# Patient Record
Sex: Female | Born: 1940
Health system: Southern US, Community
[De-identification: ages and names within clinical notes are randomized; demographics above are authoritative.]

## PROBLEM LIST (undated history)

## (undated) DIAGNOSIS — I340 Nonrheumatic mitral (valve) insufficiency: Secondary | ICD-10-CM

## (undated) DIAGNOSIS — I480 Paroxysmal atrial fibrillation: Secondary | ICD-10-CM

## (undated) DIAGNOSIS — M1990 Unspecified osteoarthritis, unspecified site: Secondary | ICD-10-CM

## (undated) DIAGNOSIS — I10 Essential (primary) hypertension: Secondary | ICD-10-CM

## (undated) DIAGNOSIS — G629 Polyneuropathy, unspecified: Secondary | ICD-10-CM

## (undated) DIAGNOSIS — Z9981 Dependence on supplemental oxygen: Secondary | ICD-10-CM

## (undated) DIAGNOSIS — N8110 Cystocele, unspecified: Secondary | ICD-10-CM

## (undated) DIAGNOSIS — I77810 Thoracic aortic ectasia: Secondary | ICD-10-CM

## (undated) DIAGNOSIS — M353 Polymyalgia rheumatica: Secondary | ICD-10-CM

## (undated) DIAGNOSIS — N390 Urinary tract infection, site not specified: Secondary | ICD-10-CM

## (undated) HISTORY — PX: JOINT REPLACEMENT: SHX530

## (undated) HISTORY — PX: CATARACT EXTRACTION W/ INTRAOCULAR LENS  IMPLANT, BILATERAL: SHX1307

## (undated) HISTORY — DX: Morbid (severe) obesity due to excess calories: E66.01

## (undated) HISTORY — DX: Paroxysmal atrial fibrillation: I48.0

## (undated) HISTORY — PX: ABDOMINAL HYSTERECTOMY: SHX81

## (undated) HISTORY — PX: LAPAROSCOPIC CHOLECYSTECTOMY: SUR755

## (undated) HISTORY — DX: Nonrheumatic mitral (valve) insufficiency: I34.0

## (undated) HISTORY — DX: Polymyalgia rheumatica: M35.3

## (undated) HISTORY — DX: Thoracic aortic ectasia: I77.810

---

## 2006-05-16 HISTORY — PX: TOTAL KNEE ARTHROPLASTY: SHX125

## 2006-10-25 ENCOUNTER — Ambulatory Visit: Payer: Self-pay | Admitting: Cardiology

## 2006-10-25 ENCOUNTER — Inpatient Hospital Stay (HOSPITAL_COMMUNITY): Admission: RE | Admit: 2006-10-25 | Discharge: 2006-11-01 | Payer: Self-pay | Admitting: Orthopedic Surgery

## 2006-10-25 ENCOUNTER — Ambulatory Visit: Payer: Self-pay | Admitting: *Deleted

## 2006-10-31 ENCOUNTER — Encounter (INDEPENDENT_AMBULATORY_CARE_PROVIDER_SITE_OTHER): Payer: Self-pay | Admitting: *Deleted

## 2006-11-15 ENCOUNTER — Ambulatory Visit: Payer: Self-pay | Admitting: Cardiovascular Disease

## 2010-09-28 NOTE — H&P (Signed)
NAME:  [name] [name], [name] [name]                  ACCOUNT NO.:  [id number]   MEDICAL RECORD NO.:  [id number]          PATIENT TYPE:  INP   LOCATION:  5001                         FACILITY:  [facility]   PHYSICIAN:  Dyke Brackett, M.D.    DATE OF BIRTH:  05/15/1941   DATE OF ADMISSION:  10/25/2006  DATE OF DISCHARGE:                              HISTORY & PHYSICAL   CHIEF COMPLAINT:  Left knee pain for the last 6 months.   HISTORY OF PRESENT ILLNESS:  This 70 year old white female patient  presented to Dr. Madelon Lips with a 38-month history of gradual-onset, but  progressively worsening left knee pain.  She has a history of a fall on  that left knee 3 years ago, but no other injury or surgery.  The pain in  the knee is a constant ache to a stabbing sensation diffuse about the  joint without radiation.  Pain increases with weightbearing and  decreases with Darvocet.  The knee locks, gives way and swells; it does  not pop, catch or grinds; it does not keep her up at night.  She has  been using a cane for the last month.  She has received cortisone  injections in the past with minimal relief.   ALLERGIES:  1. DEMEROL causes whelps.  2. LATEX BAND-AIDS cause a rash.  3. SULFA causes pruritus.   CURRENT MEDICATIONS:  1. Toprol-XL 50 mg one and a half tablet p.o. q.a.m.  2. Darvocet-N 100 one tablet p.o. q.4 h. p.r.n. for pain.  3. Aspirin 81 mg one tablet p.o. q.a.m., last dose October 19, 2006.  4. Hydrocodone one tablet p.o. q.4 h. p.r.n. for pain.  5. Tranxene 3.75 mg one tablet p.o. q.a.m. p.r.n. anxiety.   PAST MEDICAL HISTORY:  1. Hypertension.  2. Anxiety.   PAST SURGICAL HISTORY:  1. Hysterectomy, February 20, 1991.  2. Laparoscopic cholecystectomy, July 25, 1996.   She denies any complications from the above-mentioned procedures.   SOCIAL HISTORY:  She has a 58 pack-year history of cigarette smoking,  which she still smokes; she currently smokes about 2 packs a day.  She  does not drink any  alcohol nor use any drugs.  She lives with her  husband in a 2-story house.  They have 3 children who are living and 1  who passed away.  She is retired.   MEDICAL DOCTOR:  Dr. Lelon Huh in [place].   FAMILY HISTORY:  Mother died at age of 24 with Alzheimer's.  Father died  at the age of 53 with hypertension and lung cancer.  She has 1 brother  age 16 and 2 sister age 70.  Her grandparents have a history of  congestive heart failure, heart disease, diabetes and strokes.  She had  1 son who died with pancreatic cancer and she has 3 living boys, age 60,  46 and 66, who are all alive and well.   REVIEW OF SYSTEMS:  She does wear glasses.  She had pneumonia at age 31  and bronchitis about 2 years ago, but no recent problems with that.  She  does have a history of palpitations.  She has diarrhea intermittently  since her cholecystectomy.  She did have whooping cough at age 40.  She  has a remote history of bladder infection with dysuria and burning with  urination.  She does have some anxiety at times and some waking at  times.  All other systems were negative and noncontributory.   PHYSICAL EXAM:  GENERAL:  Well-developed, well-nourished white female in  no acute distress, talks easily with examiner.  Mood and affect are  appropriate.  Walks with an antalgic gait and use of a cane.  Accompanied by a family member.  Height 5-feet 4-inches, weight 206  pounds; BMI is 34.  VITAL SIGNS:  Temperature 98.8 degrees Fahrenheit, pulse 84,  respirations 18, BP 148/82.  HEENT:  Normocephalic, atraumatic, without frontal or maxillary sinus  tenderness to palpation.  Conjunctivae pink.  Sclerae anicteric.  PERRLA.  EOMs intact.  No visible external ear deformities.  Hearing  grossly intact.  Tympanic membranes are pearly gray bilaterally with  good light reflex.  Nose and nasal septum midline.  Nasal mucosa pink  and moist without exudates or polyps noted.  Buccal mucosa pink and  moist.   Dentition in good repair.  Pharynx without erythema or exudates.  Tongue and uvula midline.  Tongue without fasciculations and uvula rises  equally with phonation.  NECK:  No visible masses or lesions noted.  Trachea midline.  No  palpable lymphadenopathy nor thyromegaly.  Carotids are +2 bilaterally  without bruits.  Full range of motion and nontender to palpation along  the cervical spine.  CARDIOVASCULAR:  Heart rate and rhythm regular.  S1 and S2 present with  a grade 3/6 systolic murmur heard best at the right 2nd intercostal  space in the right sternal border.  RESPIRATORY:  Respirations even and unlabored.  Breath sounds clear to  auscultation bilaterally without rales or wheezes noted.  ABDOMEN:  Rounded abdominal contour.  Bowel sounds present x4 quadrants.  Soft and nontender to palpation without hepatosplenomegaly or CVA  tenderness.  BACK:  Nontender to palpation along the vertebral column.  VASCULAR:  Femoral pulses +2 bilaterally.  BREASTS/GU/RECTAL/PELVIC:  These exams deferred at this time.  MUSCULOSKELETAL:  No obvious deformities in bilateral upper extremities  with full range of motion of these extremities without pain.  Radial  pulses +2 bilaterally.  She has full range of motion of her hips, ankles  and toes bilaterally.  DP and PT pulses are +2.  No calf pain with  palpation.  Negative Homans' sign.  Leg otherwise neurovascularly  intact.  Right knee has full extension and flexion to 135 degrees with  minimal crepitus.  There is no pain with palpation along the joint line,  no effusion.  Stable to varus and valgus stress.  Negative anterior  drawer.  Left knee is lacking about 5 degrees of full extension and can  only flex to 95 degrees with minimal crepitus.  She does have pain with  palpation about the posterior aspect of the knee and there is a +2  effusion in the knee.  Stable to varus and valgus stress.  Negative  anterior drawer. NEUROLOGIC:  Alert and  oriented x3.  Cranial nerves II-XII are grossly  intact.  Strength is 5/5 in bilateral upper and lower extremities.  Rapid alternating movements intact.  Deep tendon reflexes 2+ in  bilateral upper and lower extremities.  Sensation intact to light touch.  Rapid alternating movements intact.  RADIOLOGIC FINDINGS:  X-rays taken of the left knee in March 2008 show  an end-stage knee with varus collapse.   IMPRESSION:  1. End-stage osteoarthritis of left knee, medial compartment.  2. Hypertension.  3. Obesity.  4. Anxiety.   PLAN:  Ms. Ruffin Frederick will be admitted to [facility] on October 25, 2006,  where she will undergo a left total knee arthroplasty by Dr. Marcie Mowers.  She will undergo all the routine preoperative laboratory tests  and studies prior to this procedure.  If we have any medical issues  while she is hospitalized, we will consult the hospitalists.      Legrand Pitts Duffy, Arnetha Courser, M.D.  Electronically Signed    KED/MEDQ  D:  10/25/2006  T:  10/26/2006  Job:  [id number]

## 2010-09-28 NOTE — Consult Note (Signed)
NAME:  [name] [name], [name] [name]                  ACCOUNT NO.:  [id number]   MEDICAL RECORD NO.:  [id number]          PATIENT TYPE:  INP   LOCATION:  5001                         FACILITY:  [facility]   PHYSICIAN:  Unice Cobble, MD     DATE OF BIRTH:  03-Apr-1941   DATE OF CONSULTATION:  10/30/2006  DATE OF DISCHARGE:                                 CONSULTATION   REFERRING PHYSICIAN:  Dyke Brackett, M.D.   CHIEF COMPLAINT:  Atrial fibrillation with RVR.   HISTORY OF PRESENT ILLNESS:  This is a 70 year old white female with a  history of hypertension and anxiety, who had a total knee arthroplasty 5  days prior, who started having atrial fibrillation with RVR tonight.  She has palpitations, but does not have any chest pain, shortness of  breath, or presyncope.  No history of prior atrial fibrillation.  No  history of coronary artery disease.  She is a active smoker.  She has  mild lower extremity edema.  No orthopnea or PND.   PAST MEDICAL HISTORY:  1. Hypertension.  2. Anxiety.  3. Status post hysterectomy in 1992.  4. Status post laparoscopic cholecystectomy in 1998.  5. Status post total knee arthroplasty, June 2008.   ALLERGIES:  DEMEROL, LATEX and SULFA.   MEDICATIONS:  1. Toprol-XL 75 mg daily.  2. Darvocet as needed for knee pain.  3. Aspirin 81 mg daily.  4. Hydrocodone as needed for knee pain.  5. Tranxene 3.75 mg p.r.n. anxiety.   SOCIAL HISTORY:  She lives in [place] with her husband.  She is  retired.  She has 50 pack-years and smokes 2 packs per day.  No alcohol  or drugs.   FAMILY HISTORY:  Her mother died at 75 with Alzheimer's.  Her father  died at 94 of lung cancer; he had hypertension.  She has a brother who  infraumbilical skin incision 63 and 2 sisters who are both 53.   REVIEW OF SYSTEMS:  Complete review of systems was done and found to be  otherwise negative except as stated in the HPI.   PHYSICAL EXAMINATION:  VITAL SIGNS:  Temperature is 97.8, pulse 165,  respiratory rate 20, blood pressure 130/70 and O2 SAT 90% on room air.  GENERAL:  She is obese, in no acute distress.  HEENT:  Shows PERRLA, EOMI, moist mucous membranes.  NECK:  Supple without lymphadenopathy, thyromegaly, bruits or  jugulovenous distention.  HEART:  Irregularly irregular rhythm.  Tachycardic.  No murmurs, gallops  or rubs.  Pulses 2+ and equal bilaterally without bruits.  LUNGS:  Clear to auscultation bilaterally without wheezes, rhonchi or  rales.  ABDOMEN:  Soft and nontender with normal bowel sounds, without rebound  or guarding.  EXTREMITIES:  No cyanosis or clubbing.  She has trace edema bilaterally  and left knee staples in a wound that appears clean without induration  or erythema.  NEUROLOGIC:  She is alert and oriented x3.   LABORATORY AND ACCESSORY CLINICAL DATA:  EKG shows a rate of 173 with a  rhythm of atrial fibrillation.  She has  some ST depression, less than a  millimeter in several leads.   Her last hemoglobin was 11 on the 14th of June.   ASSESSMENT AND PLAN:  This is a 70 year old white female with  hypertension with new-onset atrial fibrillation with rapid ventricular  response 5 days out from a total knee arthroplasty.  Diltiazem drip will  be administered for rate control and hopefully she will cardiovert with  this.  If we cannot correct her rhythm to normal sinus rhythm by the  morning, then she may need direct-current cardioversion later in the  day.  I will make her NPO and check a TSH, CBC, basic metabolic panel  and magnesium.  She will need an echocardiogram in the morning to  examine for wall motion abnormality and left atrial enlargement.  I will  rule out myocardial infarction with serial cardiac enzymes.  If all of  the above is negative, then pulmonary embolism will need to be  considered as a potential etiology considering her recent surgery.  If  this is an isolated incident, then she will likely not need long-term   anticoagulation.  However, if this continues for more than 24 hours or  recurs, then she will need to be anticoagulated with heparin in the  short term and probably Coumadin for a short period of time.      Unice Cobble, MD  Electronically Signed     ACJ/MEDQ  D:  10/30/2006  T:  10/30/2006  Job:  [id number]
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
## 2010-09-28 NOTE — Op Note (Signed)
NAME:  [name] [name], [name] [name]                  ACCOUNT NO.:  [id number]   MEDICAL RECORD NO.:  [id number]          PATIENT TYPE:  INP   LOCATION:  2899                         FACILITY:  [facility]   PHYSICIAN:  Dyke Brackett, M.D.    DATE OF BIRTH:  [birth year]   DATE OF PROCEDURE:  10/25/2006  DATE OF DISCHARGE:                               OPERATIVE REPORT   PREOPERATIVE DIAGNOSIS:  Osteoarthritis, left knee, with varus  deformity.   POSTOPERATIVE DIAGNOSIS:  Osteoarthritis, left knee, with varus  deformity.   OPERATION:  Left total knee replacement (LCS standard femur 15 mm  bearing, size #3 tibia, standard 3-peg patella).   SURGEON:  Dyke Brackett, M.D.   ASSISTANT:  Legrand Pitts. Duffy, P.A.   TOURNIQUET TIME:  1 hour 5 minutes.   DESCRIPTION OF PROCEDURE:  Sterile prep and drape, exsanguination of the  leg, placed in a tourniquet 375 mmHg.  Straight skin incision, medial  parapatellar approach to the knee made.  The tibia was cut 2 mm below  the most diseased medial compartment via my anterior-posterior cut.  Flexion gap measured at 12.5 and eventually 15 mm, each along the  extension gap of the distal 4 degree valgus cut.  Moderate release of  the medial structures was required due to the varus deformity.   Attention was next directed at the tibia once the chamfer cuts were made  on the femur, followed by placement of the tibial trial.  The tibial  trial was initially with a 12.5 mm, then a 15-mm bearing.  The 3-peg  patella was cut leaving about 13 mm of native patella.  Trial components  tracked nicely.  No tendency to bearing spinout, although the 15 mm was  somewhat more stable than the 12.5, and was elected to use the 15-mm  bearing.  Trial components were removed.  The bony surfaces irrigated.  Two batches of cement were prepared on the back table, each with 1.2  grams of tobramycin. This was inserted on the tibia, followed by the  femur and patella.  Excess cement was removed.  The  trial bearing was  placed. Excess cement was removed from the posterior aspect of the knee.  The tourniquet was released.  Small bleeders were coagulated.  No  excessive bleeding was noted.  The 15 mm trial was better than the 12.5.  Final bearing was inserted.  Closure was effected on the capsule with  Ethibond and 2-0 Vicryl and skin clips on the skin.  Taken to the  recovery room in stable condition.      Dyke Brackett, M.D.  Electronically Signed     WDC/MEDQ  D:  10/25/2006  T:  10/25/2006  Job:  [id number]

## 2010-10-01 NOTE — Discharge Summary (Signed)
NAME:  [name] [name]                  ACCOUNT NO.:  [id number]   MEDICAL RECORD NO.:  [id number]          PATIENT TYPE:  INP   LOCATION:  2003                         FACILITY:  [facility]   PHYSICIAN:  Dyke [name], M.D.    DATE OF BIRTH:  [birth year]   DATE OF ADMISSION:  10/25/2006  DATE OF DISCHARGE:  11/01/2006                               DISCHARGE SUMMARY   ADMISSION DIAGNOSES:  1. End-stage osteoarthritis, left knee.  2. Hypertension.  3. Obesity.  4. Anxiety.   DISCHARGE DIAGNOSES:  1. End-stage osteoarthritis, left knee, status post left total knee      arthroplasty.  2. Acute blood loss anemia secondary to surgery.  3. Hypokalemia.  4. Hyponatremia.  5. New onset atrial fibrillation now status post cardioversion.  6. Urinary tract infection.  7. Hypertension.  8. Obesity.  9. Anxiety.   SURGICAL PROCEDURES:  On October 25, 2006, [name] [name] underwent a left total  knee arthroplasty by [name] [name] assisted by on a Arnoldo Morale, PA-C.  She had a DePuy NBT keel tibial tray cemented size three  placed with a metal backed patella cemented size standard, an LCS  complete primary femoral component cemented size standard left and LCS  complete RP insert size standard 15 mm thickness.   COMPLICATIONS:  None.   CONSULTANT:  1. Case management and physical therapy consult October 26, 2006.  2. Occupational therapy consult October 27, 2006.  3. Rapid response and [facility] consult October 30, 2006.   HISTORY OF PRESENT ILLNESS:  This 70 year old white female patient  presented to [name] with the 24-month history of gradual onset,  progressive left knee pain.  She has a remote history of fall on the  left knee but no surgery.  Pain is constant, ache to stabbing, diffuse  about the joint without radiation.  It increases with weightbearing and  decreases with Darvocet.  Knee locks, gives way, and swells.  She has  walked with cane for the last month, and she has failed  conservative  treatment.  Because of that, she is presenting for a left total knee  replacement.   HOSPITAL COURSE:  [name] [name] tolerated her surgical procedure well without  immediate postoperative complications.  She was transferred to 5000.  Postop day #1, she was dizzy with standing, had poor appetite.  Hemoglobin was 8.7, hematocrit 25.8.  She was transfused with 2 units of  packed red blood cells and started on therapy per protocol.   Postop day #2, pain was fairly well controlled.  She was feeling better  after the blood.  She had low urine output, and that was watched.  Hemoglobin was improved to 10.4, hematocrit 31.1, and white count was  12.3.  Leg was neurovascularly intact.  She was switched to p.o. pain  medications.  Urine output was monitored, and a new UA and chest x-ray  were ordered.   Postop day #3, T-max 99.2, vital signs stable.  UA did show possible  UTI, so she was started on Cipro 500 twice a day for  3 days.  Hemoglobin  and hematocrit were stable.  Concerns were made for discharge home, so  plans were made for transfer to a skilled facility after the weekend.  She continued to do well during June 15 with no major problems, and then  in the middle of the night on June 16, she felt like her heart was  racing.  Pulse was 165, and she was in new onset rapid atrial  fibrillation.  [facility] was called.  They followed her the  rest of the hospitalization.  On the morning of June 16 due to still  being in rapid atrial fibrillation, a cardioversion was done, and she  tolerated that well.  Hemoglobin was 12.3, hematocrit 38.3, white count  10.6.  Leg was otherwise neurovascularly intact.  She was treated  initially with IV heparin through June 17 and was switched back to  Lovenox.  She was also treated with IV Cardizem until June 18. She  continued to do well with therapy and made good progress.  On the  morning of June 18, she was ready for discharge home.   T-max was 98.  Vital signs were stable.  She was maintained in normal sinus rhythm.  UTI had been treated with Cipro.  She was weaned off the Cardizem, and  it was felt she was ready for discharge home and was discharged home  later that day.   DISCHARGE INSTRUCTIONS:   DIET:  She can resume her regular prehospitalization diet.   MEDICATIONS:  She may resume her home medications except no Darvocet,  aspirin,Vicodin, or Tranxene at this time.  Home medications include:  1. Toprol XL 50 mg one p.o. q.a.m.  2. She was on Tranxene just p.r.n..  Additional medications at this time were:  1. Lovenox 40 mg subcutaneously q. 8 a.m. with the last dose to be on      June 25.  She was given 7 with no refill.  2. On June 26, she can restart  aspirin.  3. Percocet 5/325 one to two p.o. q.4 h p.r.n. for pain, #60 with no      refill.  4. Robaxin 500 mg 1-2 tablets p.o. q.6 h p.r.n. for spasm. #45 with no      refill.  5. Toprol XL 100 mg p.o. q.a.m.Marland Kitchen   ACTIVITY:  She can be out of bed, weightbearing as tolerated on the left  leg with the use of walker.  She is to have home health PT per [place].  Please see the blue total knee discharge sheet for further activity  instructions.   WOUND CARE:  She may shower after no drainage from the wound for 2 days.  Please see the blue total knee discharge sheet for further wound care  instructions.   FOLLOWUP:  She needs to follow up with [name] [name] in our [facility]  office or the [place] office on or about June 24, and she can call  [phone] to make that appointment in [place].  She is to follow up  with [name] [name] in his office on July 2 and 3:00 p.m. and needs to call  [phone] with any questions.   LABORATORY DATA:  Chest x-ray done on June 13 showed no pneumonia but  mild pulmonary vascular congestion.   Hemoglobin and hematocrit ranged from 11.2 and 33, respectively, on June  5 to 8.7 and 25.8, respectively, on the June 12 to 11.6 and  35.1,  respectively,  on June 17.  White  count went from 8.8 on June 5 to 11.1  on June 17.  Platelets went from 553 on June 5 to 339 on June 13 to 527  on June 17.  PTT on June 5 was 48.   Sodium ranged from 134 on June 5 to the 131 on June 12 to 135 on June  16.  Potassium dropped to a low of 3.3 on June 13 and then improved to  4.1.  Chloride dropped to a low of 94 on June 16.  Glucose ranged from  90 on June 5 to a high of 141 on June 16.  BUN and creatinine were 6 and  0.7, respectively,  on June 5 to 3 and 0.68, respectively, on June 13 to  4 and 0.72, respectively, on June 16.   Albumin dropped to a low of three on June 5.  Urinalysis on June 13  showed small leukocyte esterase, many epithelial, 3-6 white cells, no  red cells and few bacteria.  Culture showed no growth.      Legrand Pitts Duffy, Arnetha Courser, M.D.  Electronically Signed    KED/MEDQ  D:  12/12/2006  T:  12/12/2006  Job:  [id number]   cc:   Noralyn Pick. Eden Emms, MD, [facility]

## 2011-03-02 LAB — CBC
HCT: 35.1 — ABNORMAL LOW
HCT: 38.5
Hemoglobin: 11.6 — ABNORMAL LOW
Hemoglobin: 12.5
MCHC: 32.4
MCHC: 33.1
MCV: 90.4
MCV: 90.9
Platelets: 525 — ABNORMAL HIGH
Platelets: 527 — ABNORMAL HIGH
RBC: 3.88
RBC: 4.24
RDW: 14.7 — ABNORMAL HIGH
RDW: 14.9 — ABNORMAL HIGH
WBC: 10.6 — ABNORMAL HIGH
WBC: 11.1 — ABNORMAL HIGH

## 2011-03-02 LAB — HEPARIN LEVEL (UNFRACTIONATED)

## 2011-03-02 LAB — PROTIME-INR: INR: 1

## 2011-03-02 LAB — DIFFERENTIAL
Basophils Absolute: 0.1
Lymphocytes Relative: 25
Monocytes Absolute: 1.3 — ABNORMAL HIGH
Neutro Abs: 6.3

## 2011-03-02 LAB — BASIC METABOLIC PANEL
BUN: 4 — ABNORMAL LOW
GFR calc Af Amer: >60
GFR calc non Af Amer: >60
Potassium: 4.1
Sodium: 135

## 2011-03-02 LAB — PHOSPHORUS: Phosphorus: 4.3

## 2011-03-02 LAB — TSH: TSH: 0.895

## 2011-03-03 LAB — BASIC METABOLIC PANEL
BUN: 3 — ABNORMAL LOW
BUN: 3 — ABNORMAL LOW
CO2: 27
Calcium: 8.3 — ABNORMAL LOW
Creatinine, Ser: 0.68
GFR calc non Af Amer: >60
Glucose, Bld: 130 — ABNORMAL HIGH
Glucose, Bld: 135 — ABNORMAL HIGH
Potassium: 3.8
Sodium: 131 — ABNORMAL LOW

## 2011-03-03 LAB — COMPREHENSIVE METABOLIC PANEL
ALT: 17
Alkaline Phosphatase: 91
BUN: 6
CO2: 27
Chloride: 97
Glucose, Bld: 90
Potassium: 3.6
Sodium: 134 — ABNORMAL LOW
Total Bilirubin: 0.4
Total Protein: 7.4

## 2011-03-03 LAB — CBC
HCT: 25.8 — ABNORMAL LOW
HCT: 31.1 — ABNORMAL LOW
HCT: 33.1 — ABNORMAL LOW
Hemoglobin: 8.7 — ABNORMAL LOW
MCHC: 33.4
MCHC: 33.9
MCV: 90.4
MCV: 90.6
Platelets: 339
Platelets: 401 — ABNORMAL HIGH
RDW: 14.2 — ABNORMAL HIGH
RDW: 14.4 — ABNORMAL HIGH
RDW: 14.8 — ABNORMAL HIGH
RDW: 14.9 — ABNORMAL HIGH

## 2011-03-03 LAB — URINALYSIS, ROUTINE W REFLEX MICROSCOPIC
Bilirubin Urine: NEGATIVE
Hgb urine dipstick: NEGATIVE
Ketones, ur: NEGATIVE
Protein, ur: NEGATIVE
Specific Gravity, Urine: 1.015 (ref 1.005–1.035)
Urobilinogen, UA: 0.2
Urobilinogen, UA: 0.2
pH: 5.5

## 2011-03-03 LAB — URINE CULTURE
Colony Count: NO GROWTH
Culture: NO GROWTH

## 2011-03-03 LAB — DIFFERENTIAL
Basophils Absolute: 0.1
Basophils Relative: 1
Eosinophils Absolute: 0.1
Monocytes Relative: 10
Neutro Abs: 4.3
Neutrophils Relative %: 49

## 2011-03-03 LAB — CROSSMATCH: ABO/RH(D): A POS

## 2011-03-03 LAB — PROTIME-INR: INR: 1.1

## 2011-03-03 LAB — URINE MICROSCOPIC-ADD ON

## 2015-07-18 DIAGNOSIS — Z79899 Other long term (current) drug therapy: Secondary | ICD-10-CM | POA: Insufficient documentation

## 2015-07-18 DIAGNOSIS — M1990 Unspecified osteoarthritis, unspecified site: Secondary | ICD-10-CM | POA: Insufficient documentation

## 2015-11-09 DIAGNOSIS — M25552 Pain in left hip: Secondary | ICD-10-CM | POA: Insufficient documentation

## 2016-05-06 DIAGNOSIS — M8580 Other specified disorders of bone density and structure, unspecified site: Secondary | ICD-10-CM | POA: Insufficient documentation

## 2016-09-07 DIAGNOSIS — M810 Age-related osteoporosis without current pathological fracture: Secondary | ICD-10-CM | POA: Insufficient documentation

## 2017-01-11 DIAGNOSIS — R609 Edema, unspecified: Secondary | ICD-10-CM | POA: Insufficient documentation

## 2017-03-31 DIAGNOSIS — R7301 Impaired fasting glucose: Secondary | ICD-10-CM | POA: Insufficient documentation

## 2017-10-07 ENCOUNTER — Inpatient Hospital Stay (HOSPITAL_BASED_OUTPATIENT_CLINIC_OR_DEPARTMENT_OTHER)
Admission: EM | Admit: 2017-10-07 | Discharge: 2017-10-12 | DRG: 308 | Disposition: A | Discharge status: Home / Self Care | Payer: Medicare Other | Attending: Family Medicine | Admitting: Family Medicine

## 2017-10-07 ENCOUNTER — Emergency Department (HOSPITAL_BASED_OUTPATIENT_CLINIC_OR_DEPARTMENT_OTHER): Payer: Medicare Other

## 2017-10-07 ENCOUNTER — Encounter (HOSPITAL_BASED_OUTPATIENT_CLINIC_OR_DEPARTMENT_OTHER): Payer: Self-pay | Admitting: Emergency Medicine

## 2017-10-07 ENCOUNTER — Other Ambulatory Visit: Payer: Self-pay

## 2017-10-07 DIAGNOSIS — Z90710 Acquired absence of both cervix and uterus: Secondary | ICD-10-CM | POA: Diagnosis not present

## 2017-10-07 DIAGNOSIS — R000 Tachycardia, unspecified: Secondary | ICD-10-CM | POA: Diagnosis present

## 2017-10-07 DIAGNOSIS — R0609 Other forms of dyspnea: Secondary | ICD-10-CM

## 2017-10-07 DIAGNOSIS — Z79899 Other long term (current) drug therapy: Secondary | ICD-10-CM

## 2017-10-07 DIAGNOSIS — Z91048 Other nonmedicinal substance allergy status: Secondary | ICD-10-CM | POA: Diagnosis not present

## 2017-10-07 DIAGNOSIS — D638 Anemia in other chronic diseases classified elsewhere: Secondary | ICD-10-CM | POA: Diagnosis present

## 2017-10-07 DIAGNOSIS — R062 Wheezing: Secondary | ICD-10-CM | POA: Diagnosis not present

## 2017-10-07 DIAGNOSIS — I509 Heart failure, unspecified: Secondary | ICD-10-CM | POA: Diagnosis not present

## 2017-10-07 DIAGNOSIS — Z882 Allergy status to sulfonamides status: Secondary | ICD-10-CM

## 2017-10-07 DIAGNOSIS — G629 Polyneuropathy, unspecified: Secondary | ICD-10-CM

## 2017-10-07 DIAGNOSIS — M1990 Unspecified osteoarthritis, unspecified site: Secondary | ICD-10-CM | POA: Diagnosis present

## 2017-10-07 DIAGNOSIS — Z87891 Personal history of nicotine dependence: Secondary | ICD-10-CM

## 2017-10-07 DIAGNOSIS — Z6841 Body Mass Index (BMI) 40.0 and over, adult: Secondary | ICD-10-CM

## 2017-10-07 DIAGNOSIS — Z888 Allergy status to other drugs, medicaments and biological substances status: Secondary | ICD-10-CM

## 2017-10-07 DIAGNOSIS — I10 Essential (primary) hypertension: Secondary | ICD-10-CM

## 2017-10-07 DIAGNOSIS — E876 Hypokalemia: Secondary | ICD-10-CM | POA: Diagnosis present

## 2017-10-07 DIAGNOSIS — I420 Dilated cardiomyopathy: Secondary | ICD-10-CM

## 2017-10-07 DIAGNOSIS — Z7952 Long term (current) use of systemic steroids: Secondary | ICD-10-CM | POA: Diagnosis not present

## 2017-10-07 DIAGNOSIS — I4891 Unspecified atrial fibrillation: Principal | ICD-10-CM

## 2017-10-07 DIAGNOSIS — Z8249 Family history of ischemic heart disease and other diseases of the circulatory system: Secondary | ICD-10-CM | POA: Diagnosis not present

## 2017-10-07 DIAGNOSIS — I5021 Acute systolic (congestive) heart failure: Secondary | ICD-10-CM | POA: Diagnosis present

## 2017-10-07 DIAGNOSIS — Z9049 Acquired absence of other specified parts of digestive tract: Secondary | ICD-10-CM

## 2017-10-07 DIAGNOSIS — M353 Polymyalgia rheumatica: Secondary | ICD-10-CM | POA: Diagnosis present

## 2017-10-07 DIAGNOSIS — I110 Hypertensive heart disease with heart failure: Secondary | ICD-10-CM | POA: Diagnosis present

## 2017-10-07 DIAGNOSIS — I480 Paroxysmal atrial fibrillation: Secondary | ICD-10-CM | POA: Diagnosis present

## 2017-10-07 HISTORY — DX: Essential (primary) hypertension: I10

## 2017-10-07 HISTORY — DX: Unspecified osteoarthritis, unspecified site: M19.90

## 2017-10-07 LAB — COMPREHENSIVE METABOLIC PANEL
ALK PHOS: 63 U/L (ref 38–126)
ALT: 62 U/L — AB (ref 14–54)
AST: 62 U/L — AB (ref 15–41)
Albumin: 4.1 g/dL (ref 3.5–5.0)
Anion gap: 12 (ref 5–15)
BUN: 14 mg/dL (ref 6–20)
CALCIUM: 8.6 mg/dL — AB (ref 8.9–10.3)
CO2: 24 mmol/L (ref 22–32)
CREATININE: 1 mg/dL (ref 0.44–1.00)
Chloride: 103 mmol/L (ref 101–111)
GFR, EST NON AFRICAN AMERICAN: 53 mL/min — AB (ref >60)
GLUCOSE: 159 mg/dL — AB (ref 65–99)
Potassium: 3.5 mmol/L (ref 3.5–5.1)
Sodium: 139 mmol/L (ref 135–145)
Total Bilirubin: 0.6 mg/dL (ref 0.3–1.2)
Total Protein: 7.3 g/dL (ref 6.5–8.1)

## 2017-10-07 LAB — CBC WITH DIFFERENTIAL/PLATELET
BASOS ABS: 0.1 10*3/uL (ref 0.0–0.1)
Basophils Relative: 0 %
Eosinophils Absolute: 0.1 10*3/uL (ref 0.0–0.7)
Eosinophils Relative: 1 %
HEMATOCRIT: 40.8 % (ref 36.0–46.0)
Hemoglobin: 13.8 g/dL (ref 12.0–15.0)
LYMPHS ABS: 2.5 10*3/uL (ref 0.7–4.0)
LYMPHS PCT: 22 %
MCH: 33.4 pg (ref 26.0–34.0)
MCHC: 33.8 g/dL (ref 30.0–36.0)
MCV: 98.8 fL (ref 78.0–100.0)
MONO ABS: 0.9 10*3/uL (ref 0.1–1.0)
MONOS PCT: 8 %
NEUTROS ABS: 7.7 10*3/uL (ref 1.7–7.7)
Neutrophils Relative %: 69 %
Platelets: 230 10*3/uL (ref 150–400)
RBC: 4.13 MIL/uL (ref 3.87–5.11)
RDW: 13.8 % (ref 11.5–15.5)
WBC: 11.2 10*3/uL — ABNORMAL HIGH (ref 4.0–10.5)

## 2017-10-07 LAB — HEPARIN LEVEL (UNFRACTIONATED): Heparin Unfractionated: 0.12 IU/mL — ABNORMAL LOW (ref 0.30–0.70)

## 2017-10-07 LAB — TROPONIN I
Troponin I: 0.04 ng/mL (ref <0.03)
Troponin I: 0.04 ng/mL

## 2017-10-07 LAB — LIPASE, BLOOD: LIPASE: 30 U/L (ref 11–51)

## 2017-10-07 LAB — BRAIN NATRIURETIC PEPTIDE: B Natriuretic Peptide: 920.6 pg/mL — ABNORMAL HIGH (ref 0.0–100.0)

## 2017-10-07 LAB — TSH: TSH: 2.315 u[IU]/mL (ref 0.350–4.500)

## 2017-10-07 MED ORDER — ACETAMINOPHEN 325 MG PO TABS
650.0000 mg | ORAL_TABLET | Freq: Four times a day (QID) | ORAL | Status: DC | PRN
  Administered 2017-10-08 – 2017-10-10 (×2): 650 mg via ORAL
  Filled 2017-10-07 (×2): qty 2

## 2017-10-07 MED ORDER — METOPROLOL TARTRATE 25 MG PO TABS
25.0000 mg | ORAL_TABLET | Freq: Four times a day (QID) | ORAL | Status: DC
  Administered 2017-10-07 – 2017-10-10 (×12): 25 mg via ORAL
  Filled 2017-10-07 (×12): qty 1

## 2017-10-07 MED ORDER — HEPARIN (PORCINE) IN NACL 100-0.45 UNIT/ML-% IJ SOLN
1800.0000 [IU]/h | INTRAMUSCULAR | Status: DC
Start: 2017-10-07 — End: 2017-10-09
  Administered 2017-10-07: 1150 [IU]/h via INTRAVENOUS
  Administered 2017-10-08: 1450 [IU]/h via INTRAVENOUS
  Administered 2017-10-09: 1800 [IU]/h via INTRAVENOUS
  Filled 2017-10-07 (×3): qty 250

## 2017-10-07 MED ORDER — GABAPENTIN 300 MG PO CAPS
300.0000 mg | ORAL_CAPSULE | Freq: Every day | ORAL | Status: DC
  Administered 2017-10-07 – 2017-10-11 (×5): 300 mg via ORAL
  Filled 2017-10-07 (×5): qty 1

## 2017-10-07 MED ORDER — HYDROCODONE-ACETAMINOPHEN 5-325 MG PO TABS
1.0000 | ORAL_TABLET | Freq: Four times a day (QID) | ORAL | Status: DC | PRN
  Administered 2017-10-07 – 2017-10-09 (×5): 2 via ORAL
  Administered 2017-10-09: 1 via ORAL
  Administered 2017-10-09: 2 via ORAL
  Administered 2017-10-10: 1 via ORAL
  Administered 2017-10-10 – 2017-10-12 (×7): 2 via ORAL
  Filled 2017-10-07: qty 1
  Filled 2017-10-07 (×14): qty 2

## 2017-10-07 MED ORDER — SODIUM CHLORIDE 0.9 % IV SOLN: 250.0000 mL | INTRAVENOUS | Status: DC | PRN

## 2017-10-07 MED ORDER — PREDNISONE 10 MG PO TABS
10.0000 mg | ORAL_TABLET | Freq: Every day | ORAL | Status: DC
  Administered 2017-10-08 – 2017-10-12 (×5): 10 mg via ORAL
  Filled 2017-10-07 (×5): qty 1

## 2017-10-07 MED ORDER — DILTIAZEM LOAD VIA INFUSION
10.0000 mg | Freq: Once | INTRAVENOUS | Status: DC
  Administered 2017-10-07: 10 mg via INTRAVENOUS
  Filled 2017-10-07: qty 10

## 2017-10-07 MED ORDER — SODIUM CHLORIDE 0.9% FLUSH
3.0000 mL | Freq: Two times a day (BID) | INTRAVENOUS | Status: DC
  Administered 2017-10-07 – 2017-10-10 (×5): 3 mL via INTRAVENOUS

## 2017-10-07 MED ORDER — HEPARIN BOLUS VIA INFUSION
4000.0000 [IU] | Freq: Once | INTRAVENOUS | Status: AC
  Administered 2017-10-07: 4000 [IU] via INTRAVENOUS

## 2017-10-07 MED ORDER — DILTIAZEM HCL 100 MG IV SOLR
5.0000 mg/h | INTRAVENOUS | Status: DC
  Administered 2017-10-08: 15 mg/h via INTRAVENOUS
  Filled 2017-10-07 (×8): qty 100

## 2017-10-07 MED ORDER — METOPROLOL TARTRATE 5 MG/5ML IV SOLN
2.5000 mg | Freq: Four times a day (QID) | INTRAVENOUS | Status: DC | PRN
  Administered 2017-10-07: 2.5 mg via INTRAVENOUS

## 2017-10-07 MED ORDER — METOPROLOL TARTRATE 25 MG PO TABS
37.5000 mg | ORAL_TABLET | Freq: Every evening | ORAL | Status: DC
  Administered 2017-10-07: 37.5 mg via ORAL
  Filled 2017-10-07: qty 1

## 2017-10-07 MED ORDER — FUROSEMIDE 10 MG/ML IJ SOLN
40.0000 mg | Freq: Once | INTRAMUSCULAR | Status: AC
  Administered 2017-10-07: 40 mg via INTRAVENOUS

## 2017-10-07 MED ORDER — METOPROLOL TARTRATE 5 MG/5ML IV SOLN
INTRAVENOUS | Status: AC
  Filled 2017-10-07: qty 5

## 2017-10-07 MED ORDER — SODIUM CHLORIDE 0.9% FLUSH: 3.0000 mL | INTRAVENOUS | Status: DC | PRN

## 2017-10-07 MED ORDER — ACETAMINOPHEN 650 MG RE SUPP: 650.0000 mg | Freq: Four times a day (QID) | RECTAL | Status: DC | PRN

## 2017-10-07 MED ORDER — METOPROLOL TARTRATE 25 MG PO TABS: 25.0000 mg | ORAL_TABLET | Freq: Every morning | ORAL | Status: DC

## 2017-10-07 MED ORDER — POTASSIUM CHLORIDE CRYS ER 20 MEQ PO TBCR
40.0000 meq | EXTENDED_RELEASE_TABLET | Freq: Once | ORAL | Status: AC
  Administered 2017-10-07: 40 meq via ORAL
  Filled 2017-10-07: qty 2

## 2017-10-07 MED ORDER — ONDANSETRON HCL 4 MG/2ML IJ SOLN
4.0000 mg | Freq: Four times a day (QID) | INTRAMUSCULAR | Status: DC | PRN
  Administered 2017-10-09: 4 mg via INTRAVENOUS
  Filled 2017-10-07: qty 2

## 2017-10-07 MED ORDER — ONDANSETRON HCL 4 MG PO TABS
4.0000 mg | ORAL_TABLET | Freq: Four times a day (QID) | ORAL | Status: DC | PRN
  Administered 2017-10-08 – 2017-10-09 (×2): 4 mg via ORAL
  Filled 2017-10-07 (×2): qty 1

## 2017-10-07 MED ORDER — FUROSEMIDE 10 MG/ML IJ SOLN
INTRAMUSCULAR | Status: AC
  Filled 2017-10-07: qty 4

## 2017-10-07 MED ORDER — POLYETHYLENE GLYCOL 3350 17 G PO PACK
17.0000 g | PACK | Freq: Every day | ORAL | Status: DC
  Filled 2017-10-07 (×2): qty 1

## 2017-10-07 NOTE — ED Notes (Signed)
Report called to [employer], receiving nurse at [facility]

## 2017-10-07 NOTE — ED Notes (Signed)
Date and time results received: 10/07/17 1207   Test: trp Critical Value: 0.04 Name of Provider Notified: Madilyn Hook Orders Received? Or Actions Taken?: no orders given

## 2017-10-07 NOTE — ED Notes (Signed)
ED Provider at bedside. 

## 2017-10-07 NOTE — ED Notes (Signed)
Patient is resting comfortably. 

## 2017-10-07 NOTE — H&P (Signed)
[facility] History and Physical  [name] [name] YQM:[id number] DOB: 05-02-1941 DOA: 10/07/2017  Referring physician: Dr [name] [name].  PCP: [name] Huh [name], MD   Chief Complaint: Dyspnea.   HPI: [name] [name] is a 77 y.o. female with PMH significant for HTN, Polymyalgia Rheumatica, who presents complaining of SOB, Abdominal floating. She report SOB and abdominal bloating since Tuesday. She also notice 6 pounds weight gain. She denies chest pain. She had ECHO 2 Month ago normal ? .  She denies abdominal pain, denies diarrhea, constipation.   Evaluation in the ED at [facility]; normal electrolytes, she was found to be in A fib RVR. She was started on IV Cardizem Gtt and heparin Gtt. AST at 62, BNP 920, troponin 0.04, WBC 11, chest x ray no active diseases.    Review of Systems:  Negative , except as per HPI.   Past Medical History:  Diagnosis Date  . Arthritis   . Hypertension   . Neuropathy   . Polymyalgia (HCC)    Past Surgical History:  Procedure Laterality Date  . ABDOMINAL HYSTERECTOMY    . CHOLECYSTECTOMY    . KNEE ARTHROSCOPY     Social History:  reports that she has quit smoking. She has never used smokeless tobacco. Her alcohol and drug histories are not on file.  Allergies  Allergen Reactions  . Demerol [Meperidine Hcl] Rash  . Sulfa Antibiotics Rash    Family history;  Father died of lung cancer.  Mother; had alzheimer diseases.   Prior to Admission medications   Not on File   Physical Exam: Vitals:   10/07/17 1545 10/07/17 1555 10/07/17 1600 10/07/17 1615  BP:   127/88   Pulse: (!) 101  (!) 138 92  Resp: 15  (!) 25 17  Temp:  97.8 F (36.6 C)    TempSrc:  Oral    SpO2: 99%  97% 98%  Weight:      Height:        Wt Readings from Last 3 Encounters:  10/07/17 113.4 kg (250 lb)    General:  Appears calm and comfortable, obesed.  Eyes: PERRL, normal lids, irises & conjunctiva ENT: grossly normal hearing, lips & tongue Neck: no LAD, masses or  thyromegaly Cardiovascular: IRR, no m/r/g. Bilateral LE edema.  Respiratory: Bilateral crackles.  Tachypnea.  Abdomen: soft, ntnd, obese Skin: no rash or induration seen on limited exam Musculoskeletal: grossly normal tone BUE/BLE Psychiatric: grossly normal mood and affect, speech fluent and appropriate Neurologic: grossly non-focal.          Labs on Admission:  Basic Metabolic Panel: Recent Labs  Lab 10/07/17 1124  NA 139  K 3.5  CL 103  CO2 24  GLUCOSE 159*  BUN 14  CREATININE 1.00  CALCIUM 8.6*   Liver Function Tests: Recent Labs  Lab 10/07/17 1124  AST 62*  ALT 62*  ALKPHOS 63  BILITOT 0.6  PROT 7.3  ALBUMIN 4.1   Recent Labs  Lab 10/07/17 1124  LIPASE 30   No results for input(s): AMMONIA in the last 168 hours. CBC: Recent Labs  Lab 10/07/17 1124  WBC 11.2*  NEUTROABS 7.7  HGB 13.8  HCT 40.8  MCV 98.8  PLT 230   Cardiac Enzymes: Recent Labs  Lab 10/07/17 1124  TROPONINI 0.04*    BNP (last 3 results) Recent Labs    10/07/17 1124  BNP 920.6*    ProBNP (last 3 results) No results for input(s): PROBNP in the last 8760 hours.  CBG:  No results for input(s): GLUCAP in the last 168 hours.  Radiological Exams on Admission: Dg Chest Port 1 View  Result Date: 10/07/2017 CLINICAL DATA:  77 year-old female c/o feeling bloated, SOB, and weakness and decreased appetite x 3 days. EXAM: PORTABLE CHEST 1 VIEW COMPARISON:  None. FINDINGS: The heart size and mediastinal contours are within normal limits. Both lungs are clear. No pleural effusion or pneumothorax. The visualized skeletal structures are intact. IMPRESSION: No active disease. Electronically Signed   By: Amie Portland M.D.   On: 10/07/2017 11:42    EKG: Independently reviewed. A fib RVR.   Assessment/Plan Active Problems:   Atrial fibrillation with RVR (HCC)   HTN (hypertension)   Neuropathy  1-A fib RVR;  Admit to hospital.  Continue with Cardizem Gtt.  Check TSH, ECHO,  Troponin.  Cardiology will be consulted.  Will resume home dose metoprolol.  Started on Heparin Gtt at [employer] [place]. Will continue heparin.   2-Dyspnea; suspect related to A fib RVR, also component of volume overload. She report 6 pounds weight gain since Tuesday/  She takes oral lasix at home.  I will order one time dose of IV lasix. Will need to order lasix for 5-26. Will rate control HR.   3-PMR;  Continue with prednisone 10 mg daily.  She is also on methotrexate weekly.   4-Neuropathy; continue with gabapentin and hydrocodone/   5-mild transaminases; repeat labs in am.   6-abdominal distension;  Lipase negative.  Suspect related fluid retension.    Code Status: full code.  DVT Prophylaxis: on Heparin gtt.  Family Communication: multiples family member at bedside.  Disposition Plan: Home when HR stable.   Time spent: 75 minutes.   [name] [name] [facility] Pager [phone]

## 2017-10-07 NOTE — ED Notes (Signed)
Pt on monitor 

## 2017-10-07 NOTE — ED Provider Notes (Signed)
[facility] EMERGENCY DEPARTMENT Provider Note   CSN: [id number] Arrival date & time: 10/07/17  1046     History   Chief Complaint Chief Complaint  Patient presents with  . Abdominal Pain  . Shortness of Breath    HPI [name] [name] is a 77 y.o. female.  The history is provided by the patient. No language interpreter was used.  Abdominal Pain    Shortness of Breath  Associated symptoms include abdominal pain.   [name] [name] is a 77 y.o. female who presents to the Emergency Department complaining of sob/abdominal pain. She presents to the emergency department for shortness of breath abdominal pain. Her symptoms started abruptly on Tuesday night, four days ago. She felt like her upper abdomen all of a sudden became bloated and she felt associated shortness of breath with rapid heart rate. She does have some central chest soreness. She has chronic lower extremity edema and this is unchanged from her baseline. She denies any fever, cough, vomiting, diarrhea. She had an outpatient echocardiogram performed two months ago for lower extremity edema and was told it was normal. She has a history of PMR and inflammatory arthritis and is on chronic steroid therapy. She does not take any blood thinners. In 2008 she had any surgery performed at that time she had problems with a very rapid heart rate for several hours and she needed to get "the paddles". She has no known history of atrial fibrillation. Past Medical History:  Diagnosis Date  . Arthritis   . Hypertension   . Neuropathy   . Polymyalgia [facility])     Patient Active Problem List   Diagnosis Date Noted  . Atrial fibrillation with RVR (HCC) 10/07/2017    Past Surgical History:  Procedure Laterality Date  . ABDOMINAL HYSTERECTOMY    . CHOLECYSTECTOMY    . KNEE ARTHROSCOPY       OB History   None      Home Medications    Prior to Admission medications   Not on File    Family History No family history on  file.  Social History Social History   Tobacco Use  . Smoking status: Former Games developer  . Smokeless tobacco: Never Used  Substance Use Topics  . Alcohol use: Not on file  . Drug use: Not on file     Allergies   Demerol [meperidine hcl] and Sulfa antibiotics   Review of Systems Review of Systems  Respiratory: Positive for shortness of breath.   Gastrointestinal: Positive for abdominal pain.  All other systems reviewed and are negative.    Physical Exam Updated Vital Signs BP 127/88   Pulse (!) 138   Temp 97.8 F (36.6 C) (Oral)   Resp (!) 25   Ht  (1.626 m)   Wt 113.4 kg (250 lb)   SpO2 97%   BMI 42.91 kg/m   Physical Exam  Constitutional: She is oriented to person, place, and time. She appears well-developed and well-nourished.  HENT:  Head: Normocephalic and atraumatic.  Cardiovascular:  Tachycardic and irregular  Pulmonary/Chest: Effort normal and breath sounds normal. No respiratory distress.  Tachypnea  Abdominal: There is no tenderness. There is no rebound and no guarding.  Mild abdominal bloating without any tenderness  Musculoskeletal: She exhibits no tenderness.  2+ pitting edema to bilateral lower extremities. Violatious changes to bilateral lower extremities consistent with venous stasis. 2+ DP pulses bilaterally.  Neurological: She is alert and oriented to person, place, and time.  Skin: Skin  is warm and dry.  Psychiatric: She has a normal mood and affect. Her behavior is normal.  Nursing note and vitals reviewed.    ED Treatments / Results  Labs (all labs ordered are listed, but only abnormal results are displayed) Labs Reviewed  COMPREHENSIVE METABOLIC PANEL - Abnormal; Notable for the following components:      Result Value   Glucose, Bld 159 (*)    Calcium 8.6 (*)    AST 62 (*)    ALT 62 (*)    GFR calc non Af Amer 53 (*)    All other components within normal limits  TROPONIN I - Abnormal; Notable for the following components:    Troponin I 0.04 (*)    All other components within normal limits  BRAIN NATRIURETIC PEPTIDE - Abnormal; Notable for the following components:   B Natriuretic Peptide 920.6 (*)    All other components within normal limits  CBC WITH DIFFERENTIAL/PLATELET - Abnormal; Notable for the following components:   WBC 11.2 (*)    All other components within normal limits  LIPASE, BLOOD  HEPARIN LEVEL (UNFRACTIONATED)    EKG EKG Interpretation  Date/Time:  Saturday Oct 07 2017 10:58:39 EDT Ventricular Rate:  150 PR Interval:    QRS Duration: 76 QT Interval:  298 QTC Calculation: 470 R Axis:   31 Text Interpretation:  Atrial fibrillation with rapid ventricular response Marked ST abnormality, possible inferior subendocardial injury Abnormal ECG Confirmed by Tilden Fossa [phone]) on 10/07/2017 11:06:53 AM Also confirmed by Tilden Fossa [phone]), editor Madalyn Rob [phone])  on 10/07/2017 11:15:49 AM   Radiology Dg Chest Port 1 View  Result Date: 10/07/2017 CLINICAL DATA:  77 year-old female c/o feeling bloated, SOB, and weakness and decreased appetite x 3 days. EXAM: PORTABLE CHEST 1 VIEW COMPARISON:  None. FINDINGS: The heart size and mediastinal contours are within normal limits. Both lungs are clear. No pleural effusion or pneumothorax. The visualized skeletal structures are intact. IMPRESSION: No active disease. Electronically Signed   By: Amie Portland M.D.   On: 10/07/2017 11:42    Procedures Procedures (including critical care time) CRITICAL CARE Performed by: Tilden Fossa   Total critical care time: 40 minutes  Critical care time was exclusive of separately billable procedures and treating other patients.  Critical care was necessary to treat or prevent imminent or life-threatening deterioration.  Critical care was time spent personally by me on the following activities: development of treatment plan with patient and/or surrogate as well as nursing, discussions with  consultants, evaluation of patient's response to treatment, examination of patient, obtaining history from patient or surrogate, ordering and performing treatments and interventions, ordering and review of laboratory studies, ordering and review of radiographic studies, pulse oximetry and re-evaluation of patient's condition.  Medications Ordered in ED Medications  diltiazem (CARDIZEM) 1 mg/mL load via infusion 10 mg (10 mg Intravenous Bolus from Bag 10/07/17 1133)    And  diltiazem (CARDIZEM) 100 mg in dextrose 5 % 100 mL (1 mg/mL) infusion (15 mg/hr Intravenous Transfusing/Transfer 10/07/17 1602)  heparin ADULT infusion 100 units/mL (25000 units/276mL sodium chloride 0.45%) (1,150 Units/hr Intravenous Transfusing/Transfer 10/07/17 1602)  heparin bolus via infusion 4,000 Units (4,000 Units Intravenous Bolus from Bag 10/07/17 1225)     Initial Impression / Assessment and Plan / ED Course  I have reviewed the triage vital signs and the nursing notes.  Pertinent labs & imaging results that were available during my care of the patient were reviewed by me and considered in  my medical decision making (see chart for details).     Patient here for evaluation of abdominal bloating, shortness of breath since Tuesday. Abdominal exam is benign. She is in new onset a fib with RVR on ED evaluation. BNP is elevated but chest x-ray with no evidence of pulmonary edema. She was treated with Cardizem for rate control. Heart rate had minimal improvement with cardizem with rate improving to the one teens to 140s. Discussed with patient findings of atrial fibrillation and recommendation for admission for further treatment and she is in agreement with plan. [facility] would [facility] due to proximity to her home if space is available. [facility] was contacted - no current beds available. Hospitalist at [facility] consulted for admission. Patient is agreeable to admission at [facility].  Final  Clinical Impressions(s) / ED Diagnoses   Final diagnoses:  None    ED Discharge Orders    None       Tilden Fossa, MD 10/07/17 1620

## 2017-10-07 NOTE — ED Notes (Signed)
Report given to Caleb with [facility]. 

## 2017-10-07 NOTE — Progress Notes (Signed)
ANTICOAGULATION CONSULT NOTE - Initial Consult  Pharmacy Consult for Heparin Indication: atrial fibrillation  Allergies not on file  Patient Measurements: Height:  (162.6 cm) Weight: 250 lb (113.4 kg) IBW/kg (Calculated) : 54.7 Heparin Dosing Weight: 81.6 kg  Vital Signs: Temp: 98.5 F (36.9 C) (05/25 1051) Temp Source: Oral (05/25 1051) BP: 117/77 (05/25 1131) Pulse Rate: 114 (05/25 1131)  Labs: Recent Labs    10/07/17 1124  HGB 13.8  HCT 40.8  PLT 230    CrCl cannot be calculated (No order found.).   Medical History: Past Medical History:  Diagnosis Date  . Arthritis   . Hypertension   . Neuropathy   . Polymyalgia (HCC)    Assessment: CC/HPI: bloated, SOB, and decreased appetite x 3 days.  PMH: PMR and inflammatory arthritis and is on chronic steroid therapy, HTN, neuropathy  Anticoag: Start IV heparin for afib. No anticoagulation PTA. Baseline CBC WNL  Goal of Therapy:  Heparin level 0.3-0.7 units/ml Monitor platelets by anticoagulation protocol: Yes   Plan:  IV heparin 4000 unit bolus Heparin infusion at 1150 units/hr Check heparin level in 6-8 hrs Daily HL and CBC  Kimmy Parish S. Merilynn Finland, PharmD, BCPS Clinical Staff Pharmacist Pager [phone]  Misty Stanley Stillinger 10/07/2017,11:47 AM

## 2017-10-07 NOTE — Progress Notes (Signed)
77 year old lady with prior h/o of PMR, hypertension, comes in to [facility] for new onset atrial fib with RVR. She was started on cardizem gtt and requested transfer to [facility].  EDP spoke to DR HILTY, , requested to page cardiology for consult on arrival to [facility].  Pt accepted to step down bed.

## 2017-10-07 NOTE — ED Notes (Signed)
Family at bedside. 

## 2017-10-07 NOTE — ED Triage Notes (Signed)
Pt reports feeling bloated, SOB, and decreased appetite x 3 days.

## 2017-10-07 NOTE — Progress Notes (Signed)
ANTICOAGULATION CONSULT NOTE - Follow up Consult  Pharmacy Consult for Heparin Indication: atrial fibrillation  Allergies  Allergen Reactions  . Fluoxetine Other (See Comments)    Altered mental status  . Adhesive [Tape] Rash    Cannot tolerate any tape or bandaids more than 24 hrs.  . Demerol [Meperidine Hcl] Rash  . Sulfa Antibiotics Rash    Patient Measurements: Height:  (162.6 cm) Weight: 292 lb 1.6 oz (132.5 kg) IBW/kg (Calculated) : 54.7 Heparin Dosing Weight: 87.6 kg  Vital Signs: Temp: 98.5 F (36.9 C) (05/25 2018) Temp Source: Oral (05/25 2018) BP: 150/111 (05/25 2018) Pulse Rate: 105 (05/25 2034)  Labs: Recent Labs    10/07/17 1124 10/07/17 1835 10/07/17 2251  HGB 13.8  --   --   HCT 40.8  --   --   PLT 230  --   --   HEPARINUNFRC  --   --  0.12*  CREATININE 1.00  --   --   TROPONINI 0.04* 0.04*  --     Estimated Creatinine Clearance: 62.8 mL/min (by C-G formula based on SCr of 1 mg/dL).   Medical History: Past Medical History:  Diagnosis Date  . Arthritis   . Hypertension   . Neuropathy   . Polymyalgia (HCC)    Assessment: Start IV heparin for afib. No anticoagulation PTA. Baseline CBC WNL Heparin level is SUBtherapeutic at 0.12 this evening. Per RN, no problems with infusion or bleeding.  Goal of Therapy:  Heparin level 0.3-0.7 units/ml Monitor platelets by anticoagulation protocol: Yes   Plan:  Increase Heparin infusion to 1450 units/hr Check anti-Xa level in 6-8 hours and daily while on heparin Continue to monitor H&H and platelets   Thank you for allowing [place] to participate in this patients care.  Signe Colt, PharmD Main pharmacy at: [phone] 10/07/2017 11:56 PM

## 2017-10-07 NOTE — Consult Note (Signed)
Cardiology Consultation:   Patient ID: Naimah Yingst; [id number]; Aug 23, 1940   Admit date: 10/07/2017 Date of Consult: 10/07/2017  Primary Care Provider: Lelon Huh [name], MD Primary Cardiologist: [facility])  Patient Profile:   Merridith Dershem is a 77 y.o. female with a hx of morbid obesity, HTN, PMR, who is being seen today for the evaluation of AFRVR at the request of Dr. Sunnie Nielsen.  History of Present Illness:   Lala Been is a 77 y.o. female with a hx of morbid obesity, HTN, PMR, who is being seen today for the evaluation of AFRVR.  The patient was hospitalized today as transfer from [facility] ED, where she presented with dyspnea and abdominal bloating and weight gain. She had no chest pain. AT the outside ED, she was found to be in [facility]. ECG showed lateral ST depressions. Troponin was 0.04, BNP 920. She was started on a diltiazem gtt and heparin gtt. She was transferred to [facility] for further management.  On arrival to [facility], HR in 120s-150s. Cardiology was consulted. On my evaluation, the patient reports that she feels better since coming to the hospital. She denies any active complaints, although she is still short of breath when she tries to walk. She denies any active cardiac problems (although she does note that >10 years ago she had a fast HR after orthopedic surgery for which she was shocked into normal rhythm). She has followed with a cardiologist in [facility] and states that she has had prior normal stress tests (last 3 years ago) and echocardiograms. Of note, per [facility], the patient had an outpatient echocardiogram 2 months ago that showed EF 50-55%, although it described an inferior wall motion abnormality.   Past Medical History:  Diagnosis Date  . Arthritis   . Hypertension   . Neuropathy   . Polymyalgia (HCC)     Past Surgical History:  Procedure Laterality Date  . ABDOMINAL HYSTERECTOMY    . CHOLECYSTECTOMY    . KNEE ARTHROSCOPY        Home Medications:  Prior to Admission medications   Not on File    Inpatient Medications: Scheduled Meds: . gabapentin  300 mg Oral QHS  . metoprolol tartrate  25 mg Oral Q6H  . [START ON 10/08/2017] polyethylene glycol  17 g Oral Daily  . [START ON 10/08/2017] predniSONE  10 mg Oral Q breakfast  . sodium chloride flush  3 mL Intravenous Q12H   Continuous Infusions: . sodium chloride    . diltiazem (CARDIZEM) infusion 15 mg/hr (10/07/17 1511)  . heparin 1,150 Units/hr (10/07/17 1224)   PRN Meds: sodium chloride, acetaminophen **OR** acetaminophen, HYDROcodone-acetaminophen, metoprolol tartrate, ondansetron **OR** ondansetron (ZOFRAN) IV, sodium chloride flush  Allergies:    Allergies  Allergen Reactions  . Demerol [Meperidine Hcl] Rash  . Sulfa Antibiotics Rash    Social History:   Social History   Socioeconomic History  . Marital status: Married    Spouse name: Not on file  . Number of children: Not on file  . Years of education: Not on file  . Highest education level: Not on file  Occupational History  . Not on file  Social Needs  . Financial resource strain: Not on file  . Food insecurity:    Worry: Not on file    Inability: Not on file  . Transportation needs:    Medical: Not on file    Non-medical: Not on file  Tobacco Use  . Smoking status: Former Games developer  .  Smokeless tobacco: Never Used  Substance and Sexual Activity  . Alcohol use: Not on file  . Drug use: Not on file  . Sexual activity: Not on file  Lifestyle  . Physical activity:    Days per week: Not on file    Minutes per session: Not on file  . Stress: Not on file  Relationships  . Social connections:    Talks on phone: Not on file    Gets together: Not on file    Attends religious service: Not on file    Active member of club or organization: Not on file    Attends meetings of clubs or organizations: Not on file    Relationship status: Not on file  . Intimate partner violence:     Fear of current or ex partner: Not on file    Emotionally abused: Not on file    Physically abused: Not on file    Forced sexual activity: Not on file  Other Topics Concern  . Not on file  Social History Narrative  . Not on file    Family History:   Reports CHF in both grandmothers in their 51s  ROS:  Please see the history of present illness.  All other ROS reviewed and negative.     Physical Exam/Data:   Vitals:   10/07/17 1600 10/07/17 1615 10/07/17 1743 10/07/17 1854  BP: 127/88  (!) 127/98 131/86  Pulse: (!) 138 92 99 (!) 148  Resp: (!) 25 17 (!) 22   Temp:   97.9 F (36.6 C)   TempSrc:   Oral   SpO2: 97% 98% 98%   Weight:   132.5 kg (292 lb 1.6 oz)   Height:    (1.626 m)    No intake or output data in the 24 hours ending 10/07/17 1936 Filed Weights   10/07/17 1051 10/07/17 1743  Weight: 113.4 kg (250 lb) 132.5 kg (292 lb 1.6 oz)   Body mass index is 50.14 kg/m.  General:  Well nourished, well developed, in no acute distress  HEENT: normal Lymph: no adenopathy Neck: no apparent JVD, although exam difficult given habitus Endocrine:  No thryomegaly Cardiac:  Tachycardic and irregularly irregular; no murmur   Lungs:  Normal WOB with some bilateral coarse breath sounds Abd: soft, nontender, obese Ext: 1+ pitting edema Musculoskeletal:  No deformities, BUE and BLE strength normal and equal Skin: warm and dry  Neuro:  No focal abnormalities noted Psych:  Normal affect   EKG:  The EKG was personally reviewed and demonstrates:  AFRVR with inferolateral ST depressions  Telemetry:  Telemetry was personally reviewed and demonstrates:  AFRVR with some aberrent beats  Relevant CV Studies: TTE 07/2017 ([facility]where) Structurally normal mitral valve. Trace mitral regurgitation. The aortic valve appears to be trileaflet. There is mild aortic sclerosis noted, with no evidence of stenosis. Tricuspid valve is structurally normal. No significant tricuspid  regurgitation . Ejection fraction is visually estimated at 50-55% Low normal LV systolic function with inferior wall hypokinesis   Laboratory Data:  Chemistry Recent Labs  Lab 10/07/17 1124  NA 139  K 3.5  CL 103  CO2 24  GLUCOSE 159*  BUN 14  CREATININE 1.00  CALCIUM 8.6*  GFRNONAA 53*  GFRAA >60  ANIONGAP 12    Recent Labs  Lab 10/07/17 1124  PROT 7.3  ALBUMIN 4.1  AST 62*  ALT 62*  ALKPHOS 63  BILITOT 0.6   Hematology Recent Labs  Lab 10/07/17 1124  WBC  11.2*  RBC 4.13  HGB 13.8  HCT 40.8  MCV 98.8  MCH 33.4  MCHC 33.8  RDW 13.8  PLT 230   Cardiac Enzymes Recent Labs  Lab 10/07/17 1124  TROPONINI 0.04*   No results for input(s): TROPIPOC in the last 168 hours.  BNP Recent Labs  Lab 10/07/17 1124  BNP 920.6*    DDimer No results for input(s): DDIMER in the last 168 hours.  Radiology/Studies:  Dg Chest Port 1 View  Result Date: 10/07/2017 CLINICAL DATA:  77 year-old female c/o feeling bloated, SOB, and weakness and decreased appetite x 3 days. EXAM: PORTABLE CHEST 1 VIEW COMPARISON:  None. FINDINGS: The heart size and mediastinal contours are within normal limits. Both lungs are clear. No pleural effusion or pneumothorax. The visualized skeletal structures are intact. IMPRESSION: No active disease. Electronically Signed   By: Amie Portland M.D.   On: 10/07/2017 11:42    Assessment and Plan:   AFRVR The patient presented with nonspecific symptoms and is found to have AFRVR, which is a new diagnosis for her. The driver of her arrhythmia may be due to underlying HTN vs possible HF or other cause. Diltiazem gtt has been initiated and HR 120s on my evaluation. Heparin gtt is also started. Her CHADS2VASC score is at least 3 (age, HTN, possibly HF), and anticoagulation is appropriate. -Continue to monitor on telemetry -Continue heparin gtt -Continue diltiazem gtt -Increasing oral metoprolol to 25 mg q6hr. Can further titrate as needed for rate  control and consolidate on discharge. -Echocardiogram ordered  Weight gain LE edema Wall motion abnormality on echo The patient's presenting symptoms are suggestive of possible heart failure. Echocardiogram performed several months ago showed borderline EF with unclear diastolic function. It also showed wall motion abnormality. Echocardiogram shows ST depressions and troponin is borderline-elevated, which may represent demand ischemia, although possibly in the setting of underlying CAD. She has no chest pain. She has not had an ischemic evaluation in several years. At this time, further characterization of her systolic and diastolic function is important. If she has persistent WMA, she may benefit from repeat ischemic workup. -Continue to trend troponin -Echocardiogram ordered -Continue diuresis   HTN BP appropriate at this time. -Continue diltiazem gtt, metoprolol   For questions or updates, please contact [facility] Please consult [email or web address] for contact info under Cardiology/STEMI.   Signed, Ernest Mallick, MD  10/07/2017 7:36 PM

## 2017-10-08 ENCOUNTER — Inpatient Hospital Stay (HOSPITAL_COMMUNITY): Payer: Medicare Other

## 2017-10-08 DIAGNOSIS — I340 Nonrheumatic mitral (valve) insufficiency: Secondary | ICD-10-CM

## 2017-10-08 DIAGNOSIS — I509 Heart failure, unspecified: Secondary | ICD-10-CM

## 2017-10-08 LAB — HEPARIN LEVEL (UNFRACTIONATED)
HEPARIN UNFRACTIONATED: 0.22 [IU]/mL — AB (ref 0.30–0.70)
Heparin Unfractionated: 0.26 IU/mL — ABNORMAL LOW (ref 0.30–0.70)

## 2017-10-08 LAB — COMPREHENSIVE METABOLIC PANEL
ALT: 64 U/L — ABNORMAL HIGH (ref 14–54)
ANION GAP: 14 (ref 5–15)
AST: 59 U/L — ABNORMAL HIGH (ref 15–41)
Albumin: 3.4 g/dL — ABNORMAL LOW (ref 3.5–5.0)
Alkaline Phosphatase: 52 U/L (ref 38–126)
BUN: 11 mg/dL (ref 6–20)
CHLORIDE: 101 mmol/L (ref 101–111)
CO2: 25 mmol/L (ref 22–32)
Calcium: 8.4 mg/dL — ABNORMAL LOW (ref 8.9–10.3)
Creatinine, Ser: 1.08 mg/dL — ABNORMAL HIGH (ref 0.44–1.00)
GFR calc non Af Amer: 48 mL/min — ABNORMAL LOW (ref >60)
GFR, EST AFRICAN AMERICAN: 55 mL/min — AB (ref >60)
Glucose, Bld: 130 mg/dL — ABNORMAL HIGH (ref 65–99)
POTASSIUM: 3.7 mmol/L (ref 3.5–5.1)
SODIUM: 140 mmol/L (ref 135–145)
Total Bilirubin: 0.8 mg/dL (ref 0.3–1.2)
Total Protein: 6.2 g/dL — ABNORMAL LOW (ref 6.5–8.1)

## 2017-10-08 LAB — ECHOCARDIOGRAM COMPLETE
Height: 64 in
Weight: 4638.4 oz

## 2017-10-08 LAB — CBC
HCT: 37.3 % (ref 36.0–46.0)
Hemoglobin: 11.6 g/dL — ABNORMAL LOW (ref 12.0–15.0)
MCH: 31.6 pg (ref 26.0–34.0)
MCHC: 31.1 g/dL (ref 30.0–36.0)
MCV: 101.6 fL — ABNORMAL HIGH (ref 78.0–100.0)
PLATELETS: 204 10*3/uL (ref 150–400)
RBC: 3.67 MIL/uL — AB (ref 3.87–5.11)
RDW: 13.6 % (ref 11.5–15.5)
WBC: 10.9 10*3/uL — ABNORMAL HIGH (ref 4.0–10.5)

## 2017-10-08 LAB — TROPONIN I
Troponin I: 0.05 ng/mL (ref <0.03)
Troponin I: 0.04 ng/mL (ref <0.03)

## 2017-10-08 MED ORDER — DILTIAZEM HCL-DEXTROSE 100-5 MG/100ML-% IV SOLN (PREMIX)
5.0000 mg/h | INTRAVENOUS | Status: DC
  Administered 2017-10-08 – 2017-10-11 (×8): 15 mg/h via INTRAVENOUS
  Filled 2017-10-08 (×10): qty 100

## 2017-10-08 MED ORDER — FUROSEMIDE 10 MG/ML IJ SOLN
40.0000 mg | Freq: Two times a day (BID) | INTRAMUSCULAR | Status: AC
  Administered 2017-10-08 – 2017-10-09 (×2): 40 mg via INTRAVENOUS
  Filled 2017-10-08 (×4): qty 4

## 2017-10-08 NOTE — Progress Notes (Signed)
ANTICOAGULATION CONSULT NOTE - Follow up Consult  Pharmacy Consult for Heparin Indication: atrial fibrillation  Allergies  Allergen Reactions  . Fluoxetine Other (See Comments)    Altered mental status  . Adhesive [Tape] Rash    Cannot tolerate any tape or bandaids more than 24 hrs.  . Demerol [Meperidine Hcl] Rash  . Sulfa Antibiotics Rash    Patient Measurements: Height:  (162.6 cm) Weight: 289 lb 14.4 oz (131.5 kg) IBW/kg (Calculated) : 54.7 Heparin Dosing Weight: 87.6 kg  Vital Signs: Temp: 100 F (37.8 C) (05/26 1145) Temp Source: Oral (05/26 1145) BP: 131/101 (05/26 1409) Pulse Rate: 87 (05/26 1409)  Labs: Recent Labs    10/07/17 1124 10/07/17 1835 10/07/17 2251 10/08/17 0331 10/08/17 0609 10/08/17 1533  HGB 13.8  --   --   --  11.6*  --   HCT 40.8  --   --   --  37.3  --   PLT 230  --   --   --  204  --   HEPARINUNFRC  --   --  0.12*  --  0.22* 0.26*  CREATININE 1.00  --   --   --  1.08*  --   TROPONINI 0.04* 0.04*  --  0.05* 0.04*  --     Estimated Creatinine Clearance: 57.9 mL/min (A) (by C-G formula based on SCr of 1.08 mg/dL (H)).   Medical History: Past Medical History:  Diagnosis Date  . Arthritis   . Hypertension   . Neuropathy   . Polymyalgia (HCC)    Assessment: Start IV heparin for afib. No anticoagulation PTA. Baseline CBC WNL Heparin level remains SUBtherapeutic at 0.26 this afternoon. Per RN, no problems with infusion or bleeding.  Goal of Therapy:  Heparin level 0.3-0.7 units/ml Monitor platelets by anticoagulation protocol: Yes   Plan:  Increase Heparin infusion to 1800 units/hr Check anti-Xa level in 6-8 hours and daily while on heparin Continue to monitor H&H and platelets    Nichol Ator A. Jeanella Craze, PharmD, BCPS Clinical Pharmacist [place] Pager: [phone]  10/08/2017 4:40 PM

## 2017-10-08 NOTE — Progress Notes (Signed)
ANTICOAGULATION CONSULT NOTE - Follow up Consult  Pharmacy Consult for Heparin Indication: atrial fibrillation  Allergies  Allergen Reactions  . Fluoxetine Other (See Comments)    Altered mental status  . Adhesive [Tape] Rash    Cannot tolerate any tape or bandaids more than 24 hrs.  . Demerol [Meperidine Hcl] Rash  . Sulfa Antibiotics Rash    Patient Measurements: Height:  (162.6 cm) Weight: 289 lb 14.4 oz (131.5 kg) IBW/kg (Calculated) : 54.7 Heparin Dosing Weight: 87.6 kg  Vital Signs: Temp: 97.6 F (36.4 C) (05/26 0322) Temp Source: Oral (05/26 0322) BP: 135/84 (05/26 0322) Pulse Rate: 110 (05/26 0322)  Labs: Recent Labs    10/07/17 1124 10/07/17 1835 10/07/17 2251 10/08/17 0331 10/08/17 0609  HGB 13.8  --   --   --  11.6*  HCT 40.8  --   --   --  37.3  PLT 230  --   --   --  204  HEPARINUNFRC  --   --  0.12*  --  0.22*  CREATININE 1.00  --   --   --   --   TROPONINI 0.04* 0.04*  --  0.05*  --     Estimated Creatinine Clearance: 62.5 mL/min (by C-G formula based on SCr of 1 mg/dL).   Medical History: Past Medical History:  Diagnosis Date  . Arthritis   . Hypertension   . Neuropathy   . Polymyalgia (HCC)    Assessment: Start IV heparin for afib. No anticoagulation PTA. Baseline CBC WNL Heparin level remains SUBtherapeutic at 0.22 this morning. Per RN, no problems with infusion or bleeding.  Goal of Therapy:  Heparin level 0.3-0.7 units/ml Monitor platelets by anticoagulation protocol: Yes   Plan:  Increase Heparin infusion to 1600 units/hr Check anti-Xa level in 6-8 hours and daily while on heparin Continue to monitor H&H and platelets   Thank you for allowing [place] to participate in this patients care.  Signe Colt, PharmD Main pharmacy at: [phone] 10/08/2017 7:16 AM

## 2017-10-08 NOTE — Progress Notes (Signed)
  Echocardiogram 2D Echocardiogram has been performed.  [name] [name] 10/08/2017, 11:35 AM

## 2017-10-08 NOTE — Progress Notes (Signed)
PROGRESS NOTE    [name] [name]  WUJ:[id number] DOB: 1940/12/06 DOA: 10/07/2017 PCP: Leanora Ivanoff., MD   Brief Narrative:  [name] [name] is a 77 y.o. female with PMH significant for HTN, Polymyalgia Rheumatica, who presents complaining of SOB, and was found ot be in acute atrial fib with RVR at [facility]. She was transferred to [facility] for further evaluation. Cardiology consulted.     Assessment & Plan:   Active Problems:   Atrial fibrillation with RVR (HCC)   HTN (hypertension)   Neuropathy   New onset afib with RVR: Rate controlld with cardizem gtt.  Possibly will require DCCV this admission.  Flat troponin possibly from heart failure.  Echocardiogram ordered.  On IV heparin for anti coagulation.     Hypertension:  Well controlled.    Mild acute on chronic heart failure:  New diagnosis, unclear EF.  Echo ordered.  Strict intake and output.  Daily weights.  Resume lasix BID iv 40 MG.    Neuropathy:  Resume gabapentin.    DVT prophylaxis:Heparin.  Code Status: full code.  Family Communication:family at bedside.  Disposition Plan: pending further evaluation by cardiology.   Consultants:   Cardiology.    Procedures: none.   Antimicrobials: none.   Subjective: Breathing better. No cough.  Pedal edema persistent.   Objective: Vitals:   10/08/17 0322 10/08/17 0737 10/08/17 0904 10/08/17 1145  BP: 135/84 121/82 (!) 124/93 133/75  Pulse: (!) 110 67 (!) 116 (!) 105  Resp: (!) 27     Temp: 97.6 F (36.4 C) 98.4 F (36.9 C)  100 F (37.8 C)  TempSrc: Oral Oral  Oral  SpO2: 99%     Weight: 131.5 kg (289 lb 14.4 oz)     Height:        Intake/Output Summary (Last 24 hours) at 10/08/2017 1227 Last data filed at 10/08/2017 1000 Gross per 24 hour  Intake 618.15 ml  Output -  Net 618.15 ml   Filed Weights   10/07/17 1051 10/07/17 1743 10/08/17 0322  Weight: 113.4 kg (250 lb) 132.5 kg (292 lb 1.6 oz) 131.5 kg (289 lb 14.4 oz)     Examination:  General exam: Appears restless on 3 lit of [place] oxygen.  Respiratory system: diminished air entry throughout. No wheezing heard.  Cardiovascular system: S1 & S2 heard, irregular, tachycardic, No pedal edema. Gastrointestinal system: Abdomen is nondistended, soft and nontender. No organomegaly or masses felt. Normal bowel sounds heard. Central nervous system: Alert and oriented. No focal neurological deficits. Extremities: Symmetric 5 x 5 power. bilateral 2+ pedal edema.  Skin: No rashes, lesions or ulcers Psychiatry: Judgement and insight appear normal. Mood & affect appropriate.     Data Reviewed: I have personally reviewed following labs and imaging studies  CBC: Recent Labs  Lab 10/07/17 1124 10/08/17 0609  WBC 11.2* 10.9*  NEUTROABS 7.7  --   HGB 13.8 11.6*  HCT 40.8 37.3  MCV 98.8 101.6*  PLT 230 204   Basic Metabolic Panel: Recent Labs  Lab 10/07/17 1124 10/08/17 0609  NA 139 140  K 3.5 3.7  CL 103 101  CO2 24 25  GLUCOSE 159* 130*  BUN 14 11  CREATININE 1.00 1.08*  CALCIUM 8.6* 8.4*   GFR: Estimated Creatinine Clearance: 57.9 mL/min (A) (by C-G formula based on SCr of 1.08 mg/dL (H)). Liver Function Tests: Recent Labs  Lab 10/07/17 1124 10/08/17 0609  AST 62* 59*  ALT 62* 64*  ALKPHOS 63 52  BILITOT 0.6 0.8  PROT 7.3 6.2*  ALBUMIN 4.1 3.4*   Recent Labs  Lab 10/07/17 1124  LIPASE 30   No results for input(s): AMMONIA in the last 168 hours. Coagulation Profile: No results for input(s): INR, PROTIME in the last 168 hours. Cardiac Enzymes: Recent Labs  Lab 10/07/17 1124 10/07/17 1835 10/08/17 0331 10/08/17 0609  TROPONINI 0.04* 0.04* 0.05* 0.04*   BNP (last 3 results) No results for input(s): PROBNP in the last 8760 hours. HbA1C: No results for input(s): HGBA1C in the last 72 hours. CBG: No results for input(s): GLUCAP in the last 168 hours. Lipid Profile: No results for input(s): CHOL, HDL, LDLCALC, TRIG, CHOLHDL,  LDLDIRECT in the last 72 hours. Thyroid Function Tests: Recent Labs    10/07/17 1835  TSH 2.315   Anemia Panel: No results for input(s): VITAMINB12, FOLATE, FERRITIN, TIBC, IRON, RETICCTPCT in the last 72 hours. Sepsis Labs: No results for input(s): PROCALCITON, LATICACIDVEN in the last 168 hours.  No results found for this or any previous visit (from the past 240 hour(s)).       Radiology Studies: Dg Chest Port 1 View  Result Date: 10/07/2017 CLINICAL DATA:  77 year-old female c/o feeling bloated, SOB, and weakness and decreased appetite x 3 days. EXAM: PORTABLE CHEST 1 VIEW COMPARISON:  None. FINDINGS: The heart size and mediastinal contours are within normal limits. Both lungs are clear. No pleural effusion or pneumothorax. The visualized skeletal structures are intact. IMPRESSION: No active disease. Electronically Signed   By: Amie Portland M.D.   On: 10/07/2017 11:42        Scheduled Meds: . furosemide  40 mg Intravenous BID  . gabapentin  300 mg Oral QHS  . metoprolol tartrate  25 mg Oral Q6H  . polyethylene glycol  17 g Oral Daily  . predniSONE  10 mg Oral Q breakfast  . sodium chloride flush  3 mL Intravenous Q12H   Continuous Infusions: . sodium chloride    . diltiazem (CARDIZEM) infusion 15 mg/hr (10/08/17 0744)  . heparin 1,600 Units/hr (10/08/17 0736)     LOS: 1 day    Time spent: 35 minutes.     Kathlen Mody, MD [facility] Pager [phone]  If 7PM-7AM, please contact night-coverage [email or web address] Password [facility] 10/08/2017, 12:27 PM

## 2017-10-08 NOTE — Progress Notes (Deleted)
ANTICOAGULATION CONSULT NOTE - Follow up Consult  Pharmacy Consult for Heparin Indication: atrial fibrillation  Allergies  Allergen Reactions  . Fluoxetine Other (See Comments)    Altered mental status  . Adhesive [Tape] Rash    Cannot tolerate any tape or bandaids more than 24 hrs.  . Demerol [Meperidine Hcl] Rash  . Sulfa Antibiotics Rash   Patient Measurements: Height:  (162.6 cm) Weight: 289 lb 14.4 oz (131.5 kg) IBW/kg (Calculated) : 54.7 Heparin Dosing Weight: 87.6 kg  Vital Signs: Temp: 97.6 F (36.4 C) (05/26 0322) Temp Source: Oral (05/26 0322) BP: 135/84 (05/26 0322) Pulse Rate: 110 (05/26 0322)  Labs: Recent Labs    10/07/17 1124 10/07/17 1835 10/07/17 2251 10/08/17 0331 10/08/17 0609  HGB 13.8  --   --   --  11.6*  HCT 40.8  --   --   --  37.3  PLT 230  --   --   --  204  HEPARINUNFRC  --   --  0.12*  --  0.22*  CREATININE 1.00  --   --   --   --   TROPONINI 0.04* 0.04*  --  0.05*  --    Estimated Creatinine Clearance: 62.5 mL/min (by C-G formula based on SCr of 1 mg/dL).  Assessment: 51 yoF found to be in afib with RVR. Pharmacy consulted to dose IV heparin. No AC PTA. Heparin level remains below goal at 0.22 this AM despite rate increase overnight. Hgb 13.8>>11.6 today, pltc stable WNL. No bleeding noted or infusion issues confirmed with RN.  Goal of Therapy:  Heparin level 0.3-0.7 units/ml Monitor platelets by anticoagulation protocol: Yes   Plan:  Increase heparin gtt to 1650 units/hr Check heparin level in 8 hours Daily heparin level and CBC Monitor Hgb and s/sx of bleeding  Erin N. Zigmund Daniel, PharmD PGY1 Pharmacy Resident Pager: [phone] 10/08/2017 7:14 AM

## 2017-10-08 NOTE — Progress Notes (Signed)
DAILY PROGRESS NOTE   Patient Name: [name] [name] Date of Encounter: 10/08/2017  Chief Complaint   No chest pain, but is dyspneic  Patient Profile   [name] [name] is a 77 y.o. female with a hx of morbid obesity, HTN, PMR, who is being seen today for the evaluation of AFRVR at the request of Dr. Tyrell Antonio.  Subjective   No further chest pain overnight. Troponin flat borderline elevated at 0.04 x 3, with a single 0.05 value. BNP elevated at 920.  Appears dyspneic today - had a "rough" night last night  Objective   Vitals:   10/08/17 0246 10/08/17 0322 10/08/17 0737 10/08/17 0904  BP: 109/70 135/84 121/82 (!) 124/93  Pulse: (!) 115 (!) 110 67 (!) 116  Resp:  (!) 27    Temp:  97.6 F (36.4 C) 98.4 F (36.9 C)   TempSrc:  Oral Oral   SpO2:  99%    Weight:  289 lb 14.4 oz (131.5 kg)    Height:        Intake/Output Summary (Last 24 hours) at 10/08/2017 1054 Last data filed at 10/08/2017 1000 Gross per 24 hour  Intake 618.15 ml  Output -  Net 618.15 ml   Filed Weights   10/07/17 1051 10/07/17 1743 10/08/17 0322  Weight: 250 lb (113.4 kg) 292 lb 1.6 oz (132.5 kg) 289 lb 14.4 oz (131.5 kg)    Physical Exam   General appearance: alert, mild distress and morbidly obese Neck: JVD - 5 cm above sternal notch, no carotid bruit and thyroid not enlarged, symmetric, no tenderness/mass/nodules Lungs: diminished breath sounds bilaterally Heart: irregularly irregular rhythm Abdomen: obese Extremities: edema 1+ edema Pulses: 2+ and symmetric Skin: Skin color, texture, turgor normal. No rashes or lesions Neurologic: Grossly normal Psych: Plesant  Inpatient Medications    Scheduled Meds: . gabapentin  300 mg Oral QHS  . metoprolol tartrate  25 mg Oral Q6H  . polyethylene glycol  17 g Oral Daily  . predniSONE  10 mg Oral Q breakfast  . sodium chloride flush  3 mL Intravenous Q12H    Continuous Infusions: . sodium chloride    . diltiazem (CARDIZEM) infusion 15 mg/hr  (10/08/17 0744)  . heparin 1,600 Units/hr (10/08/17 0736)    PRN Meds: sodium chloride, acetaminophen **OR** acetaminophen, HYDROcodone-acetaminophen, metoprolol tartrate, ondansetron **OR** ondansetron (ZOFRAN) IV, sodium chloride flush   Labs   Results for orders placed or performed during the hospital encounter of 10/07/17 (from the past 48 hour(s))  Comprehensive metabolic panel     Status: Abnormal   Collection Time: 10/07/17 11:24 AM  Result Value Ref Range   Sodium 139 135 - 145 mmol/L   Potassium 3.5 3.5 - 5.1 mmol/L   Chloride 103 101 - 111 mmol/L   CO2 24 22 - 32 mmol/L   Glucose, Bld 159 (H) 65 - 99 mg/dL   BUN 14 6 - 20 mg/dL   Creatinine, Ser 1.00 0.44 - 1.00 mg/dL   Calcium 8.6 (L) 8.9 - 10.3 mg/dL   Total Protein 7.3 6.5 - 8.1 g/dL   Albumin 4.1 3.5 - 5.0 g/dL   AST 62 (H) 15 - 41 U/L   ALT 62 (H) 14 - 54 U/L   Alkaline Phosphatase 63 38 - 126 U/L   Total Bilirubin 0.6 0.3 - 1.2 mg/dL   GFR calc non Af Amer 53 (L) >60 mL/min   GFR calc Af Amer >60 >60 mL/min    Comment: (NOTE) The eGFR has been  calculated using the CKD EPI equation. This calculation has not been validated in all clinical situations. eGFR's persistently <60 mL/min signify possible Chronic Kidney Disease.    Anion gap 12 5 - 15    Comment: Performed at [facility], [address] [place], [place] [address]  Troponin I     Status: Abnormal   Collection Time: 10/07/17 11:24 AM  Result Value Ref Range   Troponin I 0.04 (HH) <0.03 ng/mL    Comment: CRITICAL RESULT CALLED TO, READ BACK BY AND VERIFIED WITH: CALLED TO C.REED RN AT 1207 ON [id number] BY [facility] Performed at [facility], [address] [place], [place] [address]   Brain natriuretic peptide     Status: Abnormal   Collection Time: 10/07/17 11:24 AM  Result Value Ref Range   B Natriuretic Peptide 920.6 (H) 0.0 - 100.0 pg/mL    Comment: Performed at [facility], [address] [place], [place]  [address]  Lipase, blood     Status: None   Collection Time: 10/07/17 11:24 AM  Result Value Ref Range   Lipase 30 11 - 51 U/L    Comment: Performed at [facility], [address] [place], [place] [address]  CBC with Differential     Status: Abnormal   Collection Time: 10/07/17 11:24 AM  Result Value Ref Range   WBC 11.2 (H) 4.0 - 10.5 K/uL   RBC 4.13 3.87 - 5.11 MIL/uL   Hemoglobin 13.8 12.0 - 15.0 g/dL   HCT 40.8 36.0 - 46.0 %   MCV 98.8 78.0 - 100.0 fL   MCH 33.4 26.0 - 34.0 pg   MCHC 33.8 30.0 - 36.0 g/dL   RDW 13.8 11.5 - 15.5 %   Platelets 230 150 - 400 K/uL   Neutrophils Relative % 69 %   Neutro Abs 7.7 1.7 - 7.7 K/uL   Lymphocytes Relative 22 %   Lymphs Abs 2.5 0.7 - 4.0 K/uL   Monocytes Relative 8 %   Monocytes Absolute 0.9 0.1 - 1.0 K/uL   Eosinophils Relative 1 %   Eosinophils Absolute 0.1 0.0 - 0.7 K/uL   Basophils Relative 0 %   Basophils Absolute 0.1 0.0 - 0.1 K/uL    Comment: Performed at [facility], [address] [place], [place] [address]  TSH     Status: None   Collection Time: 10/07/17  6:35 PM  Result Value Ref Range   TSH 2.315 0.350 - 4.500 uIU/mL    Comment: Performed by a 3rd Generation assay with a functional sensitivity of <=0.01 uIU/mL. Performed at [facility], [place] [address] [place], [place] [address]   Troponin I     Status: Abnormal   Collection Time: 10/07/17  6:35 PM  Result Value Ref Range   Troponin I 0.04 (HH) <0.03 ng/mL    Comment: CRITICAL RESULT CALLED TO, READ BACK BY AND VERIFIED WITH: RN Aurora Mask AT 1950 [id number] MARTINB Performed at [facility], [place] [address], [place], [place] [address]   Heparin level (unfractionated)     Status: Abnormal   Collection Time: 10/07/17 10:51 PM  Result Value Ref Range   Heparin Unfractionated 0.12 (L) 0.30 - 0.70 IU/mL    Comment: (NOTE) If heparin results are below expected values, and patient dosage has  been confirmed, suggest follow up testing of  antithrombin III levels. Performed at [facility], [address] [place], [place] [address]   Troponin I     Status: Abnormal   Collection Time: 10/08/17  3:31 AM  Result Value Ref Range   Troponin I 0.05 (HH) <0.03 ng/mL    Comment: CRITICAL VALUE NOTED.  VALUE IS CONSISTENT WITH PREVIOUSLY REPORTED AND CALLED VALUE. Performed at [facility], [place] [address] [place], [place] [address]   Heparin level (unfractionated)     Status: Abnormal   Collection Time: 10/08/17  6:09 AM  Result Value Ref Range   Heparin Unfractionated 0.22 (L) 0.30 - 0.70 IU/mL    Comment: (NOTE) If heparin results are below expected values, and patient dosage has  been confirmed, suggest follow up testing of antithrombin III levels. Performed at [facility], [place] [address] [place], [place] [address]   Troponin I     Status: Abnormal   Collection Time: 10/08/17  6:09 AM  Result Value Ref Range   Troponin I 0.04 (HH) <0.03 ng/mL    Comment: CRITICAL VALUE NOTED.  VALUE IS CONSISTENT WITH PREVIOUSLY REPORTED AND CALLED VALUE. Performed at [facility], [place] [address], [place], [place] [address]   CBC     Status: Abnormal   Collection Time: 10/08/17  6:09 AM  Result Value Ref Range   WBC 10.9 (H) 4.0 - 10.5 K/uL   RBC 3.67 (L) 3.87 - 5.11 MIL/uL   Hemoglobin 11.6 (L) 12.0 - 15.0 g/dL   HCT 37.3 36.0 - 46.0 %   MCV 101.6 (H) 78.0 - 100.0 fL   MCH 31.6 26.0 - 34.0 pg   MCHC 31.1 30.0 - 36.0 g/dL   RDW 13.6 11.5 - 15.5 %   Platelets 204 150 - 400 K/uL    Comment: Performed at [facility], [place] [address], [place], [place] [address]  Comprehensive metabolic panel     Status: Abnormal   Collection Time: 10/08/17  6:09 AM  Result Value Ref Range   Sodium 140 135 - 145 mmol/L   Potassium 3.7 3.5 - 5.1 mmol/L   Chloride 101 101 - 111 mmol/L   CO2 25 22 - 32 mmol/L   Glucose, Bld 130 (H) 65 - 99 mg/dL   BUN 11 6 - 20 mg/dL   Creatinine, Ser 1.08 (H) 0.44 - 1.00 mg/dL    Calcium 8.4 (L) 8.9 - 10.3 mg/dL   Total Protein 6.2 (L) 6.5 - 8.1 g/dL   Albumin 3.4 (L) 3.5 - 5.0 g/dL   AST 59 (H) 15 - 41 U/L   ALT 64 (H) 14 - 54 U/L   Alkaline Phosphatase 52 38 - 126 U/L   Total Bilirubin 0.8 0.3 - 1.2 mg/dL   GFR calc non Af Amer 48 (L) >60 mL/min   GFR calc Af Amer 55 (L) >60 mL/min    Comment: (NOTE) The eGFR has been calculated using the CKD EPI equation. This calculation has not been validated in all clinical situations. eGFR's persistently <60 mL/min signify possible Chronic Kidney Disease.    Anion gap 14 5 - 15    Comment: Performed at [facility] [address] [place], [place] [address]    ECG   N/A  Telemetry   Afib with RVR - Personally Reviewed  Radiology    Dg Chest Port 1 View  Result Date: 10/07/2017 CLINICAL DATA:  77 year-old female c/o feeling bloated, SOB, and weakness and decreased appetite x 3 days. EXAM: PORTABLE CHEST 1 VIEW  COMPARISON:  None. FINDINGS: The heart size and mediastinal contours are within normal limits. Both lungs are clear. No pleural effusion or pneumothorax. The visualized skeletal structures are intact. IMPRESSION: No active disease. Electronically Signed   By: Lajean Manes M.D.   On: 10/07/2017 11:42    Cardiac Studies   Echo pending  Assessment   1. Active Problems: 2.   Atrial fibrillation with RVR ([place]) 3.   HTN (hypertension) 4.   Neuropathy 5.   Plan   A-fib with RVR - has a remote history of this requiring cardioversion. Will likely need a TEE/DCCV this week. Flat elevated troponin, highly suspicious of cardiomyopathy. Will need additional diuresis - BNP in 900's, dyspneic on exam. Echo pending. Will continue on heparin in case we need to pursue L/RHC this week. Ultimately, anticipate switch to Eliquis.  Time Spent Directly with Patient:  I have spent a total of 25 minutes with the patient reviewing hospital notes, telemetry, EKGs, labs and examining the patient as well as  establishing an assessment and plan that was discussed personally with the patient. > 50% of time was spent in direct patient care.  Length of Stay:  LOS: 1 day   Pixie Casino, MD, [facility], [place] Director of the Advanced Lipid Disorders &  Cardiovascular Risk Reduction Clinic Diplomate of the American Board of Clinical Lipidology Attending Cardiologist  Direct Dial: [phone]  Fax: [phone]  Website:  [email or web address] 10/08/2017, 10:54 AM

## 2017-10-09 DIAGNOSIS — I5021 Acute systolic (congestive) heart failure: Secondary | ICD-10-CM

## 2017-10-09 LAB — CBC
HEMATOCRIT: 35.8 % — AB (ref 36.0–46.0)
Hemoglobin: 11.8 g/dL — ABNORMAL LOW (ref 12.0–15.0)
MCH: 32.4 pg (ref 26.0–34.0)
MCHC: 33 g/dL (ref 30.0–36.0)
MCV: 98.4 fL (ref 78.0–100.0)
Platelets: 175 10*3/uL (ref 150–400)
RBC: 3.64 MIL/uL — AB (ref 3.87–5.11)
RDW: 13.6 % (ref 11.5–15.5)
WBC: 12.3 10*3/uL — ABNORMAL HIGH (ref 4.0–10.5)

## 2017-10-09 LAB — BASIC METABOLIC PANEL
ANION GAP: 11 (ref 5–15)
BUN: 15 mg/dL (ref 6–20)
CO2: 28 mmol/L (ref 22–32)
Calcium: 8.6 mg/dL — ABNORMAL LOW (ref 8.9–10.3)
Chloride: 95 mmol/L — ABNORMAL LOW (ref 101–111)
Creatinine, Ser: 1.1 mg/dL — ABNORMAL HIGH (ref 0.44–1.00)
GFR, EST AFRICAN AMERICAN: 54 mL/min — AB (ref >60)
GFR, EST NON AFRICAN AMERICAN: 47 mL/min — AB (ref >60)
GLUCOSE: 140 mg/dL — AB (ref 65–99)
POTASSIUM: 3.8 mmol/L (ref 3.5–5.1)
Sodium: 134 mmol/L — ABNORMAL LOW (ref 135–145)

## 2017-10-09 LAB — MRSA PCR SCREENING: MRSA BY PCR: NEGATIVE

## 2017-10-09 LAB — HEPARIN LEVEL (UNFRACTIONATED)
HEPARIN UNFRACTIONATED: 0.58 [IU]/mL (ref 0.30–0.70)
Heparin Unfractionated: 0.44 IU/mL (ref 0.30–0.70)

## 2017-10-09 MED ORDER — IPRATROPIUM BROMIDE 0.02 % IN SOLN
0.5000 mg | Freq: Four times a day (QID) | RESPIRATORY_TRACT | Status: DC | PRN
  Administered 2017-10-10 – 2017-10-11 (×2): 0.5 mg via RESPIRATORY_TRACT
  Filled 2017-10-09 (×2): qty 2.5

## 2017-10-09 MED ORDER — IPRATROPIUM BROMIDE 0.02 % IN SOLN
0.5000 mg | Freq: Four times a day (QID) | RESPIRATORY_TRACT | Status: DC
  Administered 2017-10-09 (×3): 0.5 mg via RESPIRATORY_TRACT
  Filled 2017-10-09 (×4): qty 2.5

## 2017-10-09 MED ORDER — LEVALBUTEROL HCL 0.63 MG/3ML IN NEBU
0.6300 mg | INHALATION_SOLUTION | Freq: Four times a day (QID) | RESPIRATORY_TRACT | Status: DC | PRN
  Administered 2017-10-10 – 2017-10-11 (×2): 0.63 mg via RESPIRATORY_TRACT
  Filled 2017-10-09 (×2): qty 3

## 2017-10-09 MED ORDER — APIXABAN 5 MG PO TABS
5.0000 mg | ORAL_TABLET | Freq: Two times a day (BID) | ORAL | Status: DC
  Administered 2017-10-09 – 2017-10-12 (×7): 5 mg via ORAL
  Filled 2017-10-09 (×8): qty 1

## 2017-10-09 NOTE — Progress Notes (Signed)
PROGRESS NOTE    [name] [name]  AOZ:[id number] DOB: December 06, 1940 DOA: 10/07/2017 PCP: Leanora Ivanoff., MD   Brief Narrative:  [name] [name] is a 77 y.o. female with PMH significant for HTN, Polymyalgia Rheumatica, who presents complaining of SOB, and was found ot be in acute atrial fib with RVR at [facility]. She was transferred to [facility] for further evaluation. Cardiology consulted.     Assessment & Plan:   Active Problems:   Atrial fibrillation with RVR (H[facility])   HTN (hypertension)   Neuropathy   New onset afib with RVR: Rate controlld with cardizem gtt and increase lopressor to 7.5 mg every 6 hours.  Possibly will require DCCV this admission.  Flat troponin possibly from heart failure.  Echocardiogram ordered and reviewed results with the patient.  On IV heparin for anti coagulation transitioned to eliquis.     Hypertension:  Well controlled.    Mild acute on chronic  Systolic heart failure:  Echo ordered and results reviewed.  Strict intake and output.  Daily weights.  Resume lasix BID iv 40 MG.    Neuropathy:  Resume gabapentin.     DVT prophylaxis:Heparin.  Code Status: full code.  Family Communication:family at bedside.  Disposition Plan: pending further evaluation by cardiology.   Consultants:   Cardiology.    Procedures: none.   Antimicrobials: none.   Subjective: breahting is the same as yesterday. No chest pain. No palpitations.   Objective: Vitals:   10/09/17 0951 10/09/17 1400 10/09/17 1402 10/09/17 1634  BP:  127/79 127/79 128/90  Pulse: (!) 104 81 80 66  Resp: Temp:      TempSrc:      SpO2: 97% 97% 98% 94%  Weight:      Height:        Intake/Output Summary (Last 24 hours) at 10/09/2017 1708 Last data filed at 10/09/2017 1300 Gross per 24 hour  Intake 240 ml  Output 700 ml  Net -460 ml   Filed Weights   10/07/17 1743 10/08/17 0322 10/09/17 0400  Weight: 132.5 kg (292 lb 1.6 oz) 131.5 kg (289 lb 14.4 oz) 132 kg (291  lb 0.1 oz)    Examination: no change in exam.   General exam: Appears restless on 3 lit of [place] oxygen.  Respiratory system: diminished air entry throughout. No wheezing heard.  Cardiovascular system: S1 & S2 heard, irregular, tachycardic, No pedal edema. Gastrointestinal system: Abdomen is soft non tender non distended bowel sounds hears. . Central nervous system: Alert and oriented. No focal neurological deficits. Extremities: Symmetric 5 x 5 power. bilateral 2+ pedal edema.  Skin: No rashes, lesions or ulcers Psychiatry: . Mood & affect appropriate.     Data Reviewed: I have personally reviewed following labs and imaging studies  CBC: Recent Labs  Lab 10/07/17 1124 10/08/17 0609 10/09/17 0048  WBC 11.2* 10.9* 12.3*  NEUTROABS 7.7  --   --   HGB 13.8 11.6* 11.8*  HCT 40.8 37.3 35.8*  MCV 98.8 101.6* 98.4  PLT 230 204 175   Basic Metabolic Panel: Recent Labs  Lab 10/07/17 1124 10/08/17 0609 10/09/17 0946  NA 139 140 134*  K 3.5 3.7 3.8  CL 103 101 95*  CO2 GLUCOSE 159* 130* 140*  BUN CREATININE 1.00 1.08* 1.10*  CALCIUM 8.6* 8.4* 8.6*   GFR: Estimated Creatinine Clearance: 57 mL/min (A) (by C-G formula based on SCr of 1.1 mg/dL (H)). Liver Function Tests:  Recent Labs  Lab 10/07/17 1124 10/08/17 0609  AST 62* 59*  ALT 62* 64*  ALKPHOS 63 52  BILITOT 0.6 0.8  PROT 7.3 6.2*  ALBUMIN 4.1 3.4*   Recent Labs  Lab 10/07/17 1124  LIPASE 30   No results for input(s): AMMONIA in the last 168 hours. Coagulation Profile: No results for input(s): INR, PROTIME in the last 168 hours. Cardiac Enzymes: Recent Labs  Lab 10/07/17 1124 10/07/17 1835 10/08/17 0331 10/08/17 0609  TROPONINI 0.04* 0.04* 0.05* 0.04*   BNP (last 3 results) No results for input(s): PROBNP in the last 8760 hours. HbA1C: No results for input(s): HGBA1C in the last 72 hours. CBG: No results for input(s): GLUCAP in the last 168 hours. Lipid Profile: No results  for input(s): CHOL, HDL, LDLCALC, TRIG, CHOLHDL, LDLDIRECT in the last 72 hours. Thyroid Function Tests: Recent Labs    10/07/17 1835  TSH 2.315   Anemia Panel: No results for input(s): VITAMINB12, FOLATE, FERRITIN, TIBC, IRON, RETICCTPCT in the last 72 hours. Sepsis Labs: No results for input(s): PROCALCITON, LATICACIDVEN in the last 168 hours.  No results found for this or any previous visit (from the past 240 hour(s)).       Radiology Studies: No results found.      Scheduled Meds: . apixaban  5 mg Oral BID  . furosemide  40 mg Intravenous BID  . gabapentin  300 mg Oral QHS  . ipratropium  0.5 mg Nebulization Q6H  . metoprolol tartrate  25 mg Oral Q6H  . predniSONE  10 mg Oral Q breakfast  . sodium chloride flush  3 mL Intravenous Q12H   Continuous Infusions: . sodium chloride    . diltiazem (CARDIZEM) infusion 15 mg/hr (10/09/17 0241)     LOS: 2 days    Time spent: 35 minutes.     Kathlen Mody, MD [facility] Pager [phone]  If 7PM-7AM, please contact night-coverage [email or web address] Password [facility] 10/09/2017, 5:08 PM

## 2017-10-09 NOTE — Progress Notes (Signed)
Progress Note  Patient Name: [name] [name] Date of Encounter: 10/09/2017  Primary Cardiologist: No primary care provider on file.   Subjective   Denies any chest pain shortness of breath is improved.  Inpatient Medications    Scheduled Meds: . furosemide  40 mg Intravenous BID  . gabapentin  300 mg Oral QHS  . metoprolol tartrate  25 mg Oral Q6H  . polyethylene glycol  17 g Oral Daily  . predniSONE  10 mg Oral Q breakfast  . sodium chloride flush  3 mL Intravenous Q12H   Continuous Infusions: . sodium chloride    . diltiazem (CARDIZEM) infusion 15 mg/hr (10/09/17 0241)  . heparin 1,800 Units/hr (10/09/17 0034)   PRN Meds: sodium chloride, acetaminophen **OR** acetaminophen, HYDROcodone-acetaminophen, metoprolol tartrate, ondansetron **OR** ondansetron (ZOFRAN) IV, sodium chloride flush   Vital Signs    Vitals:   10/09/17 0400 10/09/17 0422 10/09/17 0729 10/09/17 0918  BP:  (!) 108/95 (!) 131/119   Pulse:   63 90  Resp:   20   Temp:  (!) 92.3 F (33.5 C) 97.6 F (36.4 C)   TempSrc:  Oral Oral   SpO2:   97%   Weight: 291 lb 0.1 oz (132 kg)     Height:        Intake/Output Summary (Last 24 hours) at 10/09/2017 0921 Last data filed at 10/09/2017 0419 Gross per 24 hour  Intake 1095.15 ml  Output 1500 ml  Net -404.85 ml   Filed Weights   10/07/17 1743 10/08/17 0322 10/09/17 0400  Weight: 292 lb 1.6 oz (132.5 kg) 289 lb 14.4 oz (131.5 kg) 291 lb 0.1 oz (132 kg)    Telemetry    Atrial fibrillation with RVR with heart rate around 106 bpm- Personally Reviewed  ECG    No new EKG to review- Personally Reviewed  Physical Exam   GEN: No acute distress.   Neck: No JVD Cardiac:  Irregularly irregular and tachycardic, no murmurs, rubs, or gallops.  Respiratory: Clear to auscultation bilaterally. GI: Soft, nontender, non-distended  MS: No edema; No deformity. Neuro:  Nonfocal  Psych: Normal affect   Labs    Chemistry Recent Labs  Lab 10/07/17 1124  10/08/17 0609  NA 139 140  K 3.5 3.7  CL 103 101  CO2 24 25  GLUCOSE 159* 130*  BUN 14 11  CREATININE 1.00 1.08*  CALCIUM 8.6* 8.4*  PROT 7.3 6.2*  ALBUMIN 4.1 3.4*  AST 62* 59*  ALT 62* 64*  ALKPHOS 63 52  BILITOT 0.6 0.8  GFRNONAA 53* 48*  GFRAA >60 55*  ANIONGAP 12 14     Hematology Recent Labs  Lab 10/07/17 1124 10/08/17 0609 10/09/17 0048  WBC 11.2* 10.9* 12.3*  RBC 4.13 3.67* 3.64*  HGB 13.8 11.6* 11.8*  HCT 40.8 37.3 35.8*  MCV 98.8 101.6* 98.4  MCH 33.4 31.6 32.4  MCHC 33.8 31.1 33.0  RDW 13.8 13.6 13.6  PLT 230 204 175    Cardiac Enzymes Recent Labs  Lab 10/07/17 1124 10/07/17 1835 10/08/17 0331 10/08/17 0609  TROPONINI 0.04* 0.04* 0.05* 0.04*   No results for input(s): TROPIPOC in the last 168 hours.   BNP Recent Labs  Lab 10/07/17 1124  BNP 920.6*     DDimer No results for input(s): DDIMER in the last 168 hours.   Radiology    Dg Chest Port 1 View  Result Date: 10/07/2017 CLINICAL DATA:  77 year-old female c/o feeling bloated, SOB, and weakness and decreased appetite  x 3 days. EXAM: PORTABLE CHEST 1 VIEW COMPARISON:  None. FINDINGS: The heart size and mediastinal contours are within normal limits. Both lungs are clear. No pleural effusion or pneumothorax. The visualized skeletal structures are intact. IMPRESSION: No active disease. Electronically Signed   By: Amie Portland M.D.   On: 10/07/2017 11:42    Cardiac Studies   2D echo 10/08/2017 Study Conclusions  - Left ventricle: The cavity size was normal. Wall thickness was   increased in a pattern of mild LVH. Systolic function was mildly   reduced. The estimated ejection fraction was in the range of 45%   to 50%. Diffuse hypokinesis. The study is not technically   sufficient to allow evaluation of LV diastolic function. Ejection   fraction (MOD, 2-plane): 47%. - Aortic valve: Trileaflet. Sclerosis without stenosis. There was   no regurgitation. - Mitral valve: Mildly thickened  leaflets . There was moderate   regurgitation. - Left atrium: The atrium was normal in size. - Inferior vena cava: The vessel was dilated. The respirophasic   diameter changes were blunted (< 50%), consistent with elevated   central venous pressure.  Impressions:  - LVEF 45-50%, mild LVH, global hypokinesis, moderate MR, normal LA   size, dilated IVC.   Patient Profile     77 y.o. female with a hx of morbid obesity, HTN, PMR,who is being seen today for the evaluation ofAFRVRat the request of Dr. Sunnie Nielsen.  Assessment & Plan    1.  Atrial fibrillation with RVR -Heart rate still remains mildly elevated at 106 bpm on telemetry. -She will continue on IV Cardizem drip and will increase Lopressor to 7.5 mg every 6 hours. -Change IV heparin to Eliquis 5 mg twice daily -Plan for TEE/DCC on Wed once she has been loaded with Eliquis  2.  Hypertension -BP has been fairly stable at 108/95 mmHg -Continue Lopressor at increased dose of 37.5 mg every 6 hours and IV Cardizem drip  3.  Elevated troponin -Troponin minimally elevated at 0.043 and 0.05 likely related to demand ischemia in the setting of A. fib with RVR -2D echo showed mild LV dysfunction with EF 45 to 50% with diffuse hypokinesis likely related to tachycardia induced cardiomyopathy.  Also moderate MR.  4.  Acute systolic CHF -BNP was elevated at 520 -She put out 1.5 L yesterday and is net -400 cc. -Continue Lasix 40 mg IV twice daily -Bmet pending this morning    For questions or updates, please contact [facility] Please consult [email or web address] for contact info under Cardiology/STEMI.      Signed, Armanda Magic, MD  10/09/2017, 9:21 AM

## 2017-10-09 NOTE — Progress Notes (Addendum)
ANTICOAGULATION CONSULT NOTE  Pharmacy Consult for Heparin Indication: atrial fibrillation  Allergies  Allergen Reactions  . Fluoxetine Other (See Comments)    Altered mental status  . Adhesive [Tape] Rash    Cannot tolerate any tape or bandaids more than 24 hrs.  . Demerol [Meperidine Hcl] Rash  . Sulfa Antibiotics Rash    Patient Measurements: Height:  (162.6 cm) Weight: 291 lb 0.1 oz (132 kg) IBW/kg (Calculated) : 54.7 Heparin Dosing Weight: 87.6 kg  Vital Signs: Temp: 97.6 F (36.4 C) (05/27 0729) Temp Source: Oral (05/27 0729) BP: 131/119 (05/27 0729) Pulse Rate: 63 (05/27 0729)  Labs: Recent Labs    10/07/17 1124 10/07/17 1835  10/08/17 0331 10/08/17 0609 10/08/17 1533 10/09/17 0048 10/09/17 0505  HGB 13.8  --   --   --  11.6*  --  11.8*  --   HCT 40.8  --   --   --  37.3  --  35.8*  --   PLT 230  --   --   --  204  --  175  --   HEPARINUNFRC  --   --    < >  --  0.22* 0.26* 0.44 0.58  CREATININE 1.00  --   --   --  1.08*  --   --   --   TROPONINI 0.04* 0.04*  --  0.05* 0.04*  --   --   --    < > = values in this interval not displayed.    Estimated Creatinine Clearance: 58 mL/min (A) (by C-G formula based on SCr of 1.08 mg/dL (H)).  Assessment: 77 y.o. female with Afib on heparin. Heparin level this morning is therapeutic at 0.58. CBC stable, no bleeding reported.    Goal of Therapy:  Heparin level 0.3-0.7 units/ml Monitor platelets by anticoagulation protocol: Yes   Plan:  Continue Heparin at 1800 units/hour Daily heparin level and CBC Monitor for s/sx of bleeding   Addendum: Patient transitioned over to Apixaban 5 mg BID Heparin discontinued  Adline Potter, PharmD Pharmacy Resident Pager: [phone]

## 2017-10-09 NOTE — Progress Notes (Signed)
ANTICOAGULATION CONSULT NOTE  Pharmacy Consult for Heparin Indication: atrial fibrillation  Allergies  Allergen Reactions  . Fluoxetine Other (See Comments)    Altered mental status  . Adhesive [Tape] Rash    Cannot tolerate any tape or bandaids more than 24 hrs.  . Demerol [Meperidine Hcl] Rash  . Sulfa Antibiotics Rash    Patient Measurements: Height:  (162.6 cm) Weight: 289 lb 14.4 oz (131.5 kg) IBW/kg (Calculated) : 54.7 Heparin Dosing Weight: 87.6 kg  Vital Signs: Temp: 98.9 F (37.2 C) (05/27 0031) Temp Source: Oral (05/27 0031) BP: 106/62 (05/27 0031) Pulse Rate: 91 (05/27 0034)  Labs: Recent Labs    10/07/17 1124 10/07/17 1835  10/08/17 0331 10/08/17 0609 10/08/17 1533 10/09/17 0048  HGB 13.8  --   --   --  11.6*  --  11.8*  HCT 40.8  --   --   --  37.3  --  35.8*  PLT 230  --   --   --  204  --  175  HEPARINUNFRC  --   --    < >  --  0.22* 0.26* 0.44  CREATININE 1.00  --   --   --  1.08*  --   --   TROPONINI 0.04* 0.04*  --  0.05* 0.04*  --   --    < > = values in this interval not displayed.    Estimated Creatinine Clearance: 57.9 mL/min (A) (by C-G formula based on SCr of 1.08 mg/dL (H)).  Assessment: 77 y.o. female with Afib for heparin  Goal of Therapy:  Heparin level 0.3-0.7 units/ml Monitor platelets by anticoagulation protocol: Yes   Plan:  Continue Heparin at current rate   Geannie Risen, PharmD, BCPS  10/09/2017 1:28 AM

## 2017-10-09 NOTE — Care Management Note (Signed)
Case Management Note  Patient Details  Name: [name] [name] MRN: [id number] Date of Birth: [birth year]  Subjective/Objective:    Pt admitted with afib RVR. She is from home with spouse who helps with some of her ADL's. Pt has walker, scooter and shower chair. States her PCP in trying to obtain her a motorized wheelchair.  Action/Plan: MD please order PT/OT as needed.  Pt started on Eliquis. Benefits check not available today d/t the holiday. CM did provide her the 30 day free coupon. CM will follow for d/c needs, physician orders.   Expected Discharge Date:                  Expected Discharge Plan:     In-House Referral:     Discharge planning Services     Post Acute Care Choice:    Choice offered to:     DME Arranged:    DME Agency:     HH Arranged:    HH Agency:     Status of Service:  In process, will continue to follow  If discussed at Long Length of Stay Meetings, dates discussed:    Additional Comments:  Kermit Balo, RN 10/09/2017, 11:51 AM

## 2017-10-10 ENCOUNTER — Encounter (HOSPITAL_COMMUNITY): Payer: Self-pay

## 2017-10-10 DIAGNOSIS — I5021 Acute systolic (congestive) heart failure: Secondary | ICD-10-CM

## 2017-10-10 DIAGNOSIS — I420 Dilated cardiomyopathy: Secondary | ICD-10-CM

## 2017-10-10 DIAGNOSIS — I4891 Unspecified atrial fibrillation: Secondary | ICD-10-CM

## 2017-10-10 LAB — BASIC METABOLIC PANEL
Anion gap: 10 (ref 5–15)
BUN: 16 mg/dL (ref 6–20)
CHLORIDE: 96 mmol/L — AB (ref 101–111)
CO2: 30 mmol/L (ref 22–32)
CREATININE: 1.08 mg/dL — AB (ref 0.44–1.00)
Calcium: 8.6 mg/dL — ABNORMAL LOW (ref 8.9–10.3)
GFR calc Af Amer: 55 mL/min — ABNORMAL LOW (ref >60)
GFR calc non Af Amer: 48 mL/min — ABNORMAL LOW (ref >60)
GLUCOSE: 121 mg/dL — AB (ref 65–99)
POTASSIUM: 3.4 mmol/L — AB (ref 3.5–5.1)
SODIUM: 136 mmol/L (ref 135–145)

## 2017-10-10 LAB — CBC
HEMATOCRIT: 34.8 % — AB (ref 36.0–46.0)
HEMOGLOBIN: 11.3 g/dL — AB (ref 12.0–15.0)
MCH: 31.9 pg (ref 26.0–34.0)
MCHC: 32.5 g/dL (ref 30.0–36.0)
MCV: 98.3 fL (ref 78.0–100.0)
Platelets: 209 10*3/uL (ref 150–400)
RBC: 3.54 MIL/uL — AB (ref 3.87–5.11)
RDW: 13.7 % (ref 11.5–15.5)
WBC: 10.5 10*3/uL (ref 4.0–10.5)

## 2017-10-10 MED ORDER — METOPROLOL TARTRATE 25 MG PO TABS
37.5000 mg | ORAL_TABLET | Freq: Four times a day (QID) | ORAL | Status: DC
  Administered 2017-10-10 – 2017-10-11 (×3): 37.5 mg via ORAL
  Filled 2017-10-10 (×3): qty 1

## 2017-10-10 MED ORDER — POTASSIUM CHLORIDE CRYS ER 20 MEQ PO TBCR
40.0000 meq | EXTENDED_RELEASE_TABLET | Freq: Once | ORAL | Status: AC
  Administered 2017-10-10: 40 meq via ORAL
  Filled 2017-10-10: qty 2

## 2017-10-10 MED ORDER — SODIUM CHLORIDE 0.9% FLUSH: 3.0000 mL | INTRAVENOUS | Status: DC | PRN

## 2017-10-10 MED ORDER — CALCIUM CARBONATE ANTACID 500 MG PO CHEW
1.0000 | CHEWABLE_TABLET | Freq: Two times a day (BID) | ORAL | Status: DC
  Administered 2017-10-11 – 2017-10-12 (×3): 200 mg via ORAL
  Filled 2017-10-10 (×3): qty 1

## 2017-10-10 MED ORDER — SODIUM CHLORIDE 0.9% FLUSH
3.0000 mL | Freq: Two times a day (BID) | INTRAVENOUS | Status: DC
  Administered 2017-10-11 – 2017-10-12 (×3): 3 mL via INTRAVENOUS

## 2017-10-10 MED ORDER — FUROSEMIDE 10 MG/ML IJ SOLN
80.0000 mg | Freq: Two times a day (BID) | INTRAMUSCULAR | Status: DC
  Administered 2017-10-10 – 2017-10-12 (×4): 80 mg via INTRAVENOUS
  Filled 2017-10-10 (×4): qty 8

## 2017-10-10 MED ORDER — CALCIUM CARBONATE ANTACID 500 MG PO CHEW
1.0000 | CHEWABLE_TABLET | Freq: Once | ORAL | Status: AC
  Administered 2017-10-10: 200 mg via ORAL
  Filled 2017-10-10: qty 1

## 2017-10-10 MED ORDER — SODIUM CHLORIDE 0.9 % IV SOLN: 250.0000 mL | INTRAVENOUS | Status: DC

## 2017-10-10 NOTE — Care Management (Addendum)
Eliquis Co pay is $45.00. CM will make the patient aware of cost. Pt uses [facility]-her location does not have in stock. [address] and [address] and [address] has medication in stock. No further needs from CM at this time. Gala Lewandowsky RN,BSN [phone]

## 2017-10-10 NOTE — Plan of Care (Signed)
  Problem: Activity: Goal: Risk for activity intolerance will decrease Outcome: Progressing   Problem: Pain Managment: Goal: General experience of comfort will improve Outcome: Progressing   Problem: Education: Goal: Knowledge of General Education information will improve Outcome: Completed/Met

## 2017-10-10 NOTE — Anesthesia Preprocedure Evaluation (Addendum)
Anesthesia Evaluation  Patient identified by MRN, date of birth, ID band Patient awake    Reviewed: Allergy & Precautions, NPO status , Patient's Chart, lab work & pertinent test results  History of Anesthesia Complications Negative for: history of anesthetic complications  Airway Mallampati: II  TM Distance: >3 FB Neck ROM: Full    Dental  (+) Dental Advisory Given   Pulmonary neg pulmonary ROS, former smoker,    breath sounds clear to auscultation       Cardiovascular hypertension, +CHF  + dysrhythmias Atrial Fibrillation  Rhythm:Irregular Rate:Normal     Neuro/Psych negative neurological ROS     GI/Hepatic negative GI ROS, Neg liver ROS,   Endo/Other  Morbid obesity  Renal/GU negative Renal ROS     Musculoskeletal negative musculoskeletal ROS (+)   Abdominal   Peds  Hematology negative hematology ROS (+)   Anesthesia Other Findings   Reproductive/Obstetrics                            Anesthesia Physical Anesthesia Plan  ASA: III  Anesthesia Plan: General   Post-op Pain Management:    Induction: Intravenous  PONV Risk Score and Plan: 3 and Treatment may vary due to age or medical condition, Propofol infusion and Ondansetron  Airway Management Planned: Natural Airway and Nasal Cannula  Additional Equipment:   Intra-op Plan:   Post-operative Plan:   Informed Consent: I have reviewed the patients History and Physical, chart, labs and discussed the procedure including the risks, benefits and alternatives for the proposed anesthesia with the patient or authorized representative who has indicated his/her understanding and acceptance.     Plan Discussed with:   Anesthesia Plan Comments:       Anesthesia Quick Evaluation

## 2017-10-10 NOTE — Discharge Instructions (Addendum)
Information on my medicine - ELIQUIS (apixaban)  This medication education was reviewed with me or my healthcare representative as part of my discharge preparation.  Why was Eliquis prescribed for you? Eliquis was prescribed for you to reduce the risk of a blood clot forming that can cause a stroke if you have a medical condition called atrial fibrillation (a type of irregular heartbeat).  What do You need to know about Eliquis ? Take your Eliquis TWICE DAILY - one tablet in the morning and one tablet in the evening with or without food. If you have difficulty swallowing the tablet whole please discuss with your pharmacist how to take the medication safely.  Take Eliquis exactly as prescribed by your doctor and DO NOT stop taking Eliquis without talking to the doctor who prescribed the medication.  Stopping may increase your risk of developing a stroke.  Refill your prescription before you run out.  After discharge, you should have regular check-up appointments with your healthcare provider that is prescribing your Eliquis.  In the future your dose may need to be changed if your kidney function or weight changes by a significant amount or as you get older.  What do you do if you miss a dose? If you miss a dose, take it as soon as you remember on the same day and resume taking twice daily.  Do not take more than one dose of ELIQUIS at the same time to make up a missed dose.  Important Safety Information A possible side effect of Eliquis is bleeding. You should call your healthcare provider right away if you experience any of the following: ? Bleeding from an injury or your nose that does not stop. ? Unusual colored urine (red or dark brown) or unusual colored stools (red or black). ? Unusual bruising for unknown reasons. ? A serious fall or if you hit your head (even if there is no bleeding).  Some medicines may interact with Eliquis and might increase your risk of bleeding or  clotting while on Eliquis. To help avoid this, consult your healthcare provider or pharmacist prior to using any new prescription or non-prescription medications, including herbals, vitamins, non-steroidal anti-inflammatory drugs (NSAIDs) and supplements.  This website has more information on Eliquis (apixaban): http://www.eliquis.com/eliquis/home   Atrial Fibrillation Atrial fibrillation is a type of heartbeat that is irregular or fast (rapid). If you have this condition, your heart keeps quivering in a weird (chaotic) way. This condition can make it so your heart cannot pump blood normally. Having this condition gives a person more risk for stroke, heart failure, and other heart problems. There are different types of atrial fibrillation. Talk with your doctor to learn about the type that you have. Follow these instructions at home:  Take over-the-counter and prescription medicines only as told by your doctor.  If your doctor prescribed a blood-thinning medicine, take it exactly as told. Taking too much of it can cause bleeding. If you do not take enough of it, you will not have the protection that you need against stroke and other problems.  Do not use any tobacco products. These include cigarettes, chewing tobacco, and e-cigarettes. If you need help quitting, ask your doctor.  If you have apnea (obstructive sleep apnea), manage it as told by your doctor.  Do not drink alcohol.  Do not drink beverages that have caffeine. These include coffee, soda, and tea.  Maintain a healthy weight. Do not use diet pills unless your doctor says they are safe for you.  Diet pills may make heart problems worse.  Follow diet instructions as told by your doctor.  Exercise regularly as told by your doctor.  Keep all follow-up visits as told by your doctor. This is important. Contact a doctor if:  You notice a change in the speed, rhythm, or strength of your heartbeat.  You are taking a blood-thinning  medicine and you notice more bruising.  You get tired more easily when you move or exercise. Get help right away if:  You have pain in your chest or your belly (abdomen).  You have sweating or weakness.  You feel sick to your stomach (nauseous).  You notice blood in your throw up (vomit), poop (stool), or pee (urine).  You are short of breath.  You suddenly have swollen feet and ankles.  You feel dizzy.  Your suddenly get weak or numb in your face, arms, or legs, especially if it happens on one side of your body.  You have trouble talking, trouble understanding, or both.  Your face or your eyelid droops on one side. These symptoms may be an emergency. Do not wait to see if the symptoms will go away. Get medical help right away. Call your local emergency services (911 in the [place] Do not drive yourself to the hospital. This information is not intended to replace advice given to you by your health care provider. Make sure you discuss any questions you have with your health care provider. Document Released: 02/09/2008 Document Revised: 10/08/2015 Document Reviewed: 08/27/2014 Elsevier Interactive Patient Education  2018 [employer].    Heart-Healthy Eating Plan Heart-healthy meal planning includes:  Limiting unhealthy fats.  Increasing healthy fats.  Making other small dietary changes.  You may need to talk with your doctor or a diet specialist (dietitian) to create an eating plan that is right for you. What types of fat should I choose?  Choose healthy fats. These include olive oil and canola oil, flaxseeds, walnuts, almonds, and seeds.  Eat more omega-3 fats. These include salmon, mackerel, sardines, tuna, flaxseed oil, and ground flaxseeds. Try to eat fish at least twice each week.  Limit saturated fats. ? Saturated fats are often found in animal products, such as meats, butter, and cream. ? Plant sources of saturated fats include palm oil, palm kernel oil, and  coconut oil.  Avoid foods with partially hydrogenated oils in them. These include stick margarine, some tub margarines, cookies, crackers, and other baked goods. These contain trans fats. What general guidelines do I need to follow?  Check food labels carefully. Identify foods with trans fats or high amounts of saturated fat.  Fill one half of your plate with vegetables and green salads. Eat 4-5 servings of vegetables per day. A serving of vegetables is: ? 1 cup of raw leafy vegetables. ?  cup of raw or cooked cut-up vegetables. ?  cup of vegetable juice.  Fill one fourth of your plate with whole grains. Look for the word "whole" as the first word in the ingredient list.  Fill one fourth of your plate with lean protein foods.  Eat 4-5 servings of fruit per day. A serving of fruit is: ? One medium whole fruit. ?  cup of dried fruit. ?  cup of fresh, frozen, or canned fruit. ?  cup of 100% fruit juice.  Eat more foods that contain soluble fiber. These include apples, broccoli, carrots, beans, peas, and barley. Try to get 20-30 g of fiber per day.  Eat more home-cooked food. Eat  less restaurant, buffet, and fast food.  Limit or avoid alcohol.  Limit foods high in starch and sugar.  Avoid fried foods.  Avoid frying your food. Try baking, boiling, grilling, or broiling it instead. You can also reduce fat by: ? Removing the skin from poultry. ? Removing all visible fats from meats. ? Skimming the fat off of stews, soups, and gravies before serving them. ? Steaming vegetables in water or broth.  Lose weight if you are overweight.  Eat 4-5 servings of nuts, legumes, and seeds per week: ? One serving of dried beans or legumes equals  cup after being cooked. ? One serving of nuts equals 1 ounces. ? One serving of seeds equals  ounce or one tablespoon.  You may need to keep track of how much salt or sodium you eat. This is especially true if you have high blood pressure.  Talk with your doctor or dietitian to get more information. What foods can I eat? Grains Breads, including [place], white, pita, wheat, raisin, rye, oatmeal, and [place]. Tortillas that are neither fried nor made with lard or trans fat. Low-fat rolls, including hotdog and hamburger buns and English muffins. Biscuits. Muffins. Waffles. Pancakes. Light popcorn. Whole-grain cereals. Flatbread. Melba toast. Pretzels. Breadsticks. Rusks. Low-fat snacks. Low-fat crackers, including oyster, saltine, matzo, graham, animal, and rye. Rice and pasta, including brown rice and pastas that are made with whole wheat. Vegetables All vegetables. Fruits All fruits, but limit coconut. Meats and Other Protein Sources Lean, well-trimmed beef, veal, pork, and lamb. Chicken and [place] without skin. All fish and shellfish. Wild duck, rabbit, pheasant, and venison. Egg whites or low-cholesterol egg substitutes. Dried beans, peas, lentils, and tofu. Seeds and most nuts. Dairy Low-fat or nonfat cheeses, including ricotta, string, and mozzarella. Skim or 1% milk that is liquid, powdered, or evaporated. Buttermilk that is made with low-fat milk. Nonfat or low-fat yogurt. Beverages Mineral water. Diet carbonated beverages. Sweets and Desserts Sherbets and fruit ices. Honey, jam, marmalade, jelly, and syrups. Meringues and gelatins. Pure sugar candy, such as hard candy, jelly beans, gumdrops, mints, marshmallows, and small amounts of dark chocolate. [employer]. Eat all sweets and desserts in moderation. Fats and Oils Nonhydrogenated (trans-free) margarines. Vegetable oils, including soybean, sesame, sunflower, olive, peanut, safflower, corn, canola, and cottonseed. Salad dressings or mayonnaise made with a vegetable oil. Limit added fats and oils that you use for cooking, baking, salads, and as spreads. Other Cocoa powder. Coffee and tea. All seasonings and condiments. The items listed above may not be a complete list of  recommended foods or beverages. Contact your dietitian for more options. What foods are not recommended? Grains Breads that are made with saturated or trans fats, oils, or whole milk. Croissants. Butter rolls. Cheese breads. Sweet rolls. Donuts. Buttered popcorn. Chow mein noodles. High-fat crackers, such as cheese or butter crackers. Meats and Other Protein Sources Fatty meats, such as hotdogs, short ribs, sausage, spareribs, bacon, rib eye roast or steak, and mutton. High-fat deli meats, such as salami and bologna. Caviar. Domestic duck and goose. Organ meats, such as kidney, liver, sweetbreads, and heart. Dairy Cream, sour cream, cream cheese, and creamed cottage cheese. Whole-milk cheeses, including blue (bleu), [address], [place], [place], [address], [place], [address], cheddar, [place], and [place]. Whole or 2% milk that is liquid, evaporated, or condensed. Whole buttermilk. Cream sauce or high-fat cheese sauce. Yogurt that is made from whole milk. Beverages Regular sodas and juice drinks with added sugar. Sweets and Desserts Frosting. Pudding. Cookies. Cakes  other than angel food cake. Candy that has milk chocolate or white chocolate, hydrogenated fat, butter, coconut, or unknown ingredients. Buttered syrups. Full-fat ice cream or ice cream drinks. Fats and Oils Gravy that has suet, meat fat, or shortening. Cocoa butter, hydrogenated oils, palm oil, coconut oil, palm kernel oil. These can often be found in baked products, candy, fried foods, nondairy creamers, and whipped toppings. Solid fats and shortenings, including bacon fat, salt pork, lard, and butter. Nondairy cream substitutes, such as coffee creamers and sour cream substitutes. Salad dressings that are made of unknown oils, cheese, or sour cream. The items listed above may not be a complete list of foods and beverages to avoid. Contact your dietitian for more information. This information is not intended to replace advice given to you by  your health care provider. Make sure you discuss any questions you have with your health care provider. Document Released: 11/01/2011 Document Revised: 10/08/2015 Document Reviewed: 10/24/2013 Elsevier Interactive Patient Education  [employer].

## 2017-10-10 NOTE — Progress Notes (Signed)
PROGRESS NOTE    Johnnye Sandford  WUJ:[id number] DOB: 1941/04/12 DOA: 10/07/2017 PCP: Leanora Ivanoff., MD   Brief Narrative:  Marcee Jacobs is a 77 y.o. female with PMH significant for HTN, Polymyalgia Rheumatica, who presents complaining of SOB, and was found ot be in acute atrial fib with RVR at [facility]. She was transferred to [facility] for further evaluation. Cardiology consulted.     Assessment & Plan:   Active Problems:   Atrial fibrillation with RVR (HCC)   HTN (hypertension)   Neuropathy   Atrial fibrillation with rapid ventricular response (HCC)   DCM (dilated cardiomyopathy) (HCC)   Acute systolic CHF (congestive heart failure) (HCC)   Atrial fibrillation with RVR: Rate controlld with cardizem gtt and metoprolol 25 mg every 6 hours.  Possibly will require DCCV this admission.  Flat troponin possibly from heart failure.  Echocardiogram ordered and reviewed results with the patient.  On eliquis for anti coagulation.  Plan for DCCV tomorrow.     Hypertension:  Well controlled.    Mild acute on chronic  Systolic heart failure:  Echo ordered and results reviewed.  Strict intake and output.  Daily weights.     Neuropathy:  Resume gabapentin.   Hypokalemia:  Replete as needed.    Mild normocytic anemia:  Hemoglobin stable around 11.     DVT prophylaxis:Heparin.  Code Status: full code.  Family Communication:family at bedside.  Disposition Plan: pending further evaluation by cardiology.   Consultants:   Cardiology.    Procedures:DCCV in am.   Antimicrobials: none.   Subjective: No change in breathing when compared to yesterday did not urinate much after lasix.   Objective: Vitals:   10/10/17 0725 10/10/17 0800 10/10/17 1006 10/10/17 1237  BP: 134/86   95/80  Pulse: (!) 130 81 97 82  Resp:   18 19  Temp: 98.1 F (36.7 C)   98.4 F (36.9 C)  TempSrc: Oral   Oral  SpO2: 91%  94%   Weight:      Height:        Intake/Output Summary  (Last 24 hours) at 10/10/2017 1307 Last data filed at 10/10/2017 1303 Gross per 24 hour  Intake 486 ml  Output 500 ml  Net -14 ml   Filed Weights   10/08/17 0322 10/09/17 0400 10/10/17 0538  Weight: 131.5 kg (289 lb 14.4 oz) 132 kg (291 lb 0.1 oz) 131.9 kg (290 lb 12.8 oz)    Examination: no change in exam.   General exam: Appears restless on1 lit of [place] oxygen.  Respiratory system: diminished air entry throughout. No wheezing heard.  Cardiovascular system: S1 & S2 heard, irregular, tachycardic, No pedal edema. Gastrointestinal system: Abdomen is soft NT ND BS+. Central nervous system: Alert and oriented. NON FOCAL.  Extremities: Symmetric 5 x 5 power. bilateral 2+ pedal edema.  Skin: No rashes, lesions or ulcers Psychiatry: . Mood & affect appropriate.     Data Reviewed: I have personally reviewed following labs and imaging studies  CBC: Recent Labs  Lab 10/07/17 1124 10/08/17 0609 10/09/17 0048 10/10/17 0719  WBC 11.2* 10.9* 12.3* 10.5  NEUTROABS 7.7  --   --   --   HGB 13.8 11.6* 11.8* 11.3*  HCT 40.8 37.3 35.8* 34.8*  MCV 98.8 101.6* 98.4 98.3  PLT 230 204 175 209   Basic Metabolic Panel: Recent Labs  Lab 10/07/17 1124 10/08/17 0609 10/09/17 0946 10/10/17 0719  NA 139 140 134* 136  K 3.5 3.7 3.8 3.4*  CL 103 101 95* 96*  CO2 GLUCOSE 159* 130* 140* 121*  BUN CREATININE 1.00 1.08* 1.10* 1.08*  CALCIUM 8.6* 8.4* 8.6* 8.6*   GFR: Estimated Creatinine Clearance: 58 mL/min (A) (by C-G formula based on SCr of 1.08 mg/dL (H)). Liver Function Tests: Recent Labs  Lab 10/07/17 1124 10/08/17 0609  AST 62* 59*  ALT 62* 64*  ALKPHOS 63 52  BILITOT 0.6 0.8  PROT 7.3 6.2*  ALBUMIN 4.1 3.4*   Recent Labs  Lab 10/07/17 1124  LIPASE 30   No results for input(s): AMMONIA in the last 168 hours. Coagulation Profile: No results for input(s): INR, PROTIME in the last 168 hours. Cardiac Enzymes: Recent Labs  Lab 10/07/17 1124  10/07/17 1835 10/08/17 0331 10/08/17 0609  TROPONINI 0.04* 0.04* 0.05* 0.04*   BNP (last 3 results) No results for input(s): PROBNP in the last 8760 hours. HbA1C: No results for input(s): HGBA1C in the last 72 hours. CBG: No results for input(s): GLUCAP in the last 168 hours. Lipid Profile: No results for input(s): CHOL, HDL, LDLCALC, TRIG, CHOLHDL, LDLDIRECT in the last 72 hours. Thyroid Function Tests: Recent Labs    10/07/17 1835  TSH 2.315   Anemia Panel: No results for input(s): VITAMINB12, FOLATE, FERRITIN, TIBC, IRON, RETICCTPCT in the last 72 hours. Sepsis Labs: No results for input(s): PROCALCITON, LATICACIDVEN in the last 168 hours.  Recent Results (from the past 240 hour(s))  MRSA PCR Screening     Status: None   Collection Time: 10/09/17  8:09 PM  Result Value Ref Range Status   MRSA by PCR NEGATIVE NEGATIVE Final    Comment:        The GeneXpert MRSA Assay (FDA approved for NASAL specimens only), is one component of a comprehensive MRSA colonization surveillance program. It is not intended to diagnose MRSA infection nor to guide or monitor treatment for MRSA infections. Performed at [facility], [address] [place], [place] [address]          Radiology Studies: No results found.      Scheduled Meds: . apixaban  5 mg Oral BID  . gabapentin  300 mg Oral QHS  . metoprolol tartrate  25 mg Oral Q6H  . predniSONE  10 mg Oral Q breakfast  . sodium chloride flush  3 mL Intravenous Q12H  . sodium chloride flush  3 mL Intravenous Q12H   Continuous Infusions: . sodium chloride    . sodium chloride    . diltiazem (CARDIZEM) infusion 15 mg/hr (10/10/17 1205)     LOS: 3 days    Time spent: 35 minutes.     Kathlen Mody, MD [facility] Pager [phone]  If 7PM-7AM, please contact night-coverage [email or web address] Password [email or web address] 10/10/2017, 1:07 PM

## 2017-10-10 NOTE — H&P (View-Only) (Signed)
 Progress Note  Patient Name: [name] [name] Date of Encounter: 10/10/2017  Primary Cardiologist: No primary care provider on file.   Subjective   Still complains of shortness of breath.  She says she has been wheezing some.  Telemetry still shows atrial fibrillation mainly in the low 100s but as high as 130 bpm.  Inpatient Medications    Scheduled Meds: . apixaban  5 mg Oral BID  . gabapentin  300 mg Oral QHS  . metoprolol tartrate  25 mg Oral Q6H  . predniSONE  10 mg Oral Q breakfast  . sodium chloride flush  3 mL Intravenous Q12H   Continuous Infusions: . sodium chloride    . diltiazem (CARDIZEM) infusion 15 mg/hr (10/10/17 0545)   PRN Meds: sodium chloride, acetaminophen **OR** acetaminophen, HYDROcodone-acetaminophen, ipratropium, levalbuterol, metoprolol tartrate, ondansetron **OR** ondansetron (ZOFRAN) IV, sodium chloride flush   Vital Signs    Vitals:   10/09/17 2334 10/10/17 0538 10/10/17 0725 10/10/17 0800  BP: 117/87 128/83 134/86   Pulse: 76 82 (!) 130 81  Resp:      Temp: 98.4 F (36.9 C) 98.2 F (36.8 C) 98.1 F (36.7 C)   TempSrc: Oral Oral Oral   SpO2: 90% 90% 91%   Weight:  290 lb 12.8 oz (131.9 kg)    Height:        Intake/Output Summary (Last 24 hours) at 10/10/2017 0810 Last data filed at 10/10/2017 0801 Gross per 24 hour  Intake 246 ml  Output 200 ml  Net 46 ml   Filed Weights   10/08/17 0322 10/09/17 0400 10/10/17 0538  Weight: 289 lb 14.4 oz (131.5 kg) 291 lb 0.1 oz (132 kg) 290 lb 12.8 oz (131.9 kg)    Telemetry    Atrial fibrillation with RVR at times up to 110 to 120 bpm- Personally Reviewed  ECG    No new EKG to review- Personally Reviewed  Physical Exam   GEN: No acute distress.   Neck: No JVD Cardiac:  Irregularly irregular, no murmurs, rubs, or gallops.  Respiratory:  Crackles at bases GI: Soft, nontender, non-distended  MS: No edema; No deformity. Neuro:  Nonfocal  Psych: Normal affect   Labs     Chemistry Recent Labs  Lab 10/07/17 1124 10/08/17 0609 10/09/17 0946  NA 139 140 134*  K 3.5 3.7 3.8  CL 103 101 95*  CO2 24 25 28  GLUCOSE 159* 130* 140*  BUN 14 11 15  CREATININE 1.00 1.08* 1.10*  CALCIUM 8.6* 8.4* 8.6*  PROT 7.3 6.2*  --   ALBUMIN 4.1 3.4*  --   AST 62* 59*  --   ALT 62* 64*  --   ALKPHOS 63 52  --   BILITOT 0.6 0.8  --   GFRNONAA 53* 48* 47*  GFRAA >60 55* 54*  ANIONGAP 12 14 11     Hematology Recent Labs  Lab 10/07/17 1124 10/08/17 0609 10/09/17 0048  WBC 11.2* 10.9* 12.3*  RBC 4.13 3.67* 3.64*  HGB 13.8 11.6* 11.8*  HCT 40.8 37.3 35.8*  MCV 98.8 101.6* 98.4  MCH 33.4 31.6 32.4  MCHC 33.8 31.1 33.0  RDW 13.8 13.6 13.6  PLT 230 204 175    Cardiac Enzymes Recent Labs  Lab 10/07/17 1124 10/07/17 1835 10/08/17 0331 10/08/17 0609  TROPONINI 0.04* 0.04* 0.05* 0.04*   No results for input(s): TROPIPOC in the last 168 hours.   BNP Recent Labs  Lab 10/07/17 1124  BNP 920.6*     DDimer   No results for input(s): DDIMER in the last 168 hours.   Radiology    No results found.  Cardiac Studies   2D echo 10/08/2017 Study Conclusions  - Left ventricle: The cavity size was normal. Wall thickness was increased in a pattern of mild LVH. Systolic function was mildly reduced. The estimated ejection fraction was in the range of 45% to 50%. Diffuse hypokinesis. The study is not technically sufficient to allow evaluation of LV diastolic function. Ejection fraction (MOD, 2-plane): 47%. - Aortic valve: Trileaflet. Sclerosis without stenosis. There was no regurgitation. - Mitral valve: Mildly thickened leaflets . There was moderate regurgitation. - Left atrium: The atrium was normal in size. - Inferior vena cava: The vessel was dilated. The respirophasic diameter changes were blunted (<50%), consistent with elevated central venous pressure.  Impressions:  - LVEF 45-50%, mild LVH, global hypokinesis, moderate MR,  normal LA size, dilated IVC.    Patient Profile     77 y.o. female with a hx of morbid obesity, HTN, PMR,who is being seen today for the evaluation ofAFRVRat the request of Dr. Sunnie Nielsen.  Assessment & Plan    1.  Atrial fibrillation with RVR -Heart rate still elevated as high as 130 bpm -Continue on IV Cardizem drip  -Increase Lopressor to 37.5 mg every 6 hours for better rate control -Eliquis 5 mg twice daily started yesterday -NPO after midnight for TEE/DCCV tomorrow (she will have had 4 doses of Eliquis) -Risks and benefits of transesophageal echocardiogram have been explained including risks of esophageal damage, perforation (1:10,000 risk), bleeding, pharyngeal hematoma as well as other potential complications associated with conscious sedation including aspiration, arrhythmia, respiratory failure and death. Alternatives to treatment were discussed, questions were answered. Patient is willing to proceed.   2.  Hypertension -BP well controlled on exam today at 134/86 mmHg -Continue Cardizem drip and BB  3.  Elevated troponin -Troponin minimally elevated at 0.043 and 0.05 likely related to demand ischemia in the setting of A. fib with RVR -2D echo showed mild LV dysfunction with EF 45 to 50% with diffuse hypokinesis likely related to tachycardia induced cardiomyopathy.  Also moderate MR. -Repeat 2D echocardiogram once she is back in normal sinus rhythm for 1 to 2 months  4.  Acute systolic CHF -BNP was elevated at 520 -UOP fell off yesterday and she only put out 200 cc.  She is net -355 cc -Weight is down 2 pounds from admission -Increase Lasix to 80 mg IV twice daily as she is still complaining of shortness of breath -Bmet pending this morning  I have spent a total of 35 minutes with patient reviewing echo, hospital notes , telemetry, EKGs, labs and examining patient as well as establishing an assessment and plan that was discussed with the patient.  > 50% of time was  spent in direct patient care.    Armanda Magic, MD  10/10/2017 8:25 AM    For questions or updates, please contact [facility] Please consult [email or web address] for contact info under Cardiology/STEMI.      Signed, Armanda Magic, MD  10/10/2017, 8:10 AM

## 2017-10-10 NOTE — Progress Notes (Addendum)
Progress Note  Patient Name: [name] [name] Date of Encounter: 10/10/2017  Primary Cardiologist: No primary care provider on file.   Subjective   Still complains of shortness of breath.  She says she has been wheezing some.  Telemetry still shows atrial fibrillation mainly in the low 100s but as high as 130 bpm.  Inpatient Medications    Scheduled Meds: . apixaban  5 mg Oral BID  . gabapentin  300 mg Oral QHS  . metoprolol tartrate  25 mg Oral Q6H  . predniSONE  10 mg Oral Q breakfast  . sodium chloride flush  3 mL Intravenous Q12H   Continuous Infusions: . sodium chloride    . diltiazem (CARDIZEM) infusion 15 mg/hr (10/10/17 0545)   PRN Meds: sodium chloride, acetaminophen **OR** acetaminophen, HYDROcodone-acetaminophen, ipratropium, levalbuterol, metoprolol tartrate, ondansetron **OR** ondansetron (ZOFRAN) IV, sodium chloride flush   Vital Signs    Vitals:   10/09/17 2334 10/10/17 0538 10/10/17 0725 10/10/17 0800  BP: 117/87 128/83 134/86   Pulse: 76 82 (!) 130 81  Resp:      Temp: 98.4 F (36.9 C) 98.2 F (36.8 C) 98.1 F (36.7 C)   TempSrc: Oral Oral Oral   SpO2: 90% 90% 91%   Weight:  290 lb 12.8 oz (131.9 kg)    Height:        Intake/Output Summary (Last 24 hours) at 10/10/2017 0810 Last data filed at 10/10/2017 0801 Gross per 24 hour  Intake 246 ml  Output 200 ml  Net 46 ml   Filed Weights   10/08/17 0322 10/09/17 0400 10/10/17 0538  Weight: 289 lb 14.4 oz (131.5 kg) 291 lb 0.1 oz (132 kg) 290 lb 12.8 oz (131.9 kg)    Telemetry    Atrial fibrillation with RVR at times up to 110 to 120 bpm- Personally Reviewed  ECG    No new EKG to review- Personally Reviewed  Physical Exam   GEN: No acute distress.   Neck: No JVD Cardiac:  Irregularly irregular, no murmurs, rubs, or gallops.  Respiratory:  Crackles at bases GI: Soft, nontender, non-distended  MS: No edema; No deformity. Neuro:  Nonfocal  Psych: Normal affect   Labs     Chemistry Recent Labs  Lab 10/07/17 1124 10/08/17 0609 10/09/17 0946  NA 139 140 134*  K 3.5 3.7 3.8  CL 103 101 95*  CO2 GLUCOSE 159* 130* 140*  BUN CREATININE 1.00 1.08* 1.10*  CALCIUM 8.6* 8.4* 8.6*  PROT 7.3 6.2*  --   ALBUMIN 4.1 3.4*  --   AST 62* 59*  --   ALT 62* 64*  --   ALKPHOS 63 52  --   BILITOT 0.6 0.8  --   GFRNONAA 53* 48* 47*  GFRAA >60 55* 54*  ANIONGAP Hematology Recent Labs  Lab 10/07/17 1124 10/08/17 0609 10/09/17 0048  WBC 11.2* 10.9* 12.3*  RBC 4.13 3.67* 3.64*  HGB 13.8 11.6* 11.8*  HCT 40.8 37.3 35.8*  MCV 98.8 101.6* 98.4  MCH 33.4 31.6 32.4  MCHC 33.8 31.1 33.0  RDW 13.8 13.6 13.6  PLT 230 204 175    Cardiac Enzymes Recent Labs  Lab 10/07/17 1124 10/07/17 1835 10/08/17 0331 10/08/17 0609  TROPONINI 0.04* 0.04* 0.05* 0.04*   No results for input(s): TROPIPOC in the last 168 hours.   BNP Recent Labs  Lab 10/07/17 1124  BNP 920.6*     DDimer  No results for input(s): DDIMER in the last 168 hours.   Radiology    No results found.  Cardiac Studies   2D echo 10/08/2017 Study Conclusions  - Left ventricle: The cavity size was normal. Wall thickness was increased in a pattern of mild LVH. Systolic function was mildly reduced. The estimated ejection fraction was in the range of 45% to 50%. Diffuse hypokinesis. The study is not technically sufficient to allow evaluation of LV diastolic function. Ejection fraction (MOD, 2-plane): 47%. - Aortic valve: Trileaflet. Sclerosis without stenosis. There was no regurgitation. - Mitral valve: Mildly thickened leaflets . There was moderate regurgitation. - Left atrium: The atrium was normal in size. - Inferior vena cava: The vessel was dilated. The respirophasic diameter changes were blunted (<50%), consistent with elevated central venous pressure.  Impressions:  - LVEF 45-50%, mild LVH, global hypokinesis, moderate MR,  normal LA size, dilated IVC.    Patient Profile     77 y.o. female with a hx of morbid obesity, HTN, PMR,who is being seen today for the evaluation ofAFRVRat the request of Dr. Sunnie Nielsen.  Assessment & Plan    1.  Atrial fibrillation with RVR -Heart rate still elevated as high as 130 bpm -Continue on IV Cardizem drip  -Increase Lopressor to 37.5 mg every 6 hours for better rate control -Eliquis 5 mg twice daily started yesterday -NPO after midnight for TEE/DCCV tomorrow (she will have had 4 doses of Eliquis) -Risks and benefits of transesophageal echocardiogram have been explained including risks of esophageal damage, perforation (1:10,000 risk), bleeding, pharyngeal hematoma as well as other potential complications associated with conscious sedation including aspiration, arrhythmia, respiratory failure and death. Alternatives to treatment were discussed, questions were answered. Patient is willing to proceed.   2.  Hypertension -BP well controlled on exam today at 134/86 mmHg -Continue Cardizem drip and BB  3.  Elevated troponin -Troponin minimally elevated at 0.043 and 0.05 likely related to demand ischemia in the setting of A. fib with RVR -2D echo showed mild LV dysfunction with EF 45 to 50% with diffuse hypokinesis likely related to tachycardia induced cardiomyopathy.  Also moderate MR. -Repeat 2D echocardiogram once she is back in normal sinus rhythm for 1 to 2 months  4.  Acute systolic CHF -BNP was elevated at 520 -UOP fell off yesterday and she only put out 200 cc.  She is net -355 cc -Weight is down 2 pounds from admission -Increase Lasix to 80 mg IV twice daily as she is still complaining of shortness of breath -Bmet pending this morning  I have spent a total of 35 minutes with patient reviewing echo, hospital notes , telemetry, EKGs, labs and examining patient as well as establishing an assessment and plan that was discussed with the patient.  > 50% of time was  spent in direct patient care.    Armanda Magic, MD  10/10/2017 8:25 AM    For questions or updates, please contact [facility] Please consult [email or web address] for contact info under Cardiology/STEMI.      Signed, Armanda Magic, MD  10/10/2017, 8:10 AM

## 2017-10-11 ENCOUNTER — Encounter (HOSPITAL_COMMUNITY)
Admission: EM | Disposition: A | Discharge status: Home / Self Care | Payer: Self-pay | Source: Home / Self Care | Attending: Internal Medicine

## 2017-10-11 ENCOUNTER — Inpatient Hospital Stay (HOSPITAL_COMMUNITY): Payer: Medicare Other | Admitting: Anesthesiology

## 2017-10-11 ENCOUNTER — Inpatient Hospital Stay (HOSPITAL_COMMUNITY): Payer: Medicare Other

## 2017-10-11 ENCOUNTER — Encounter (HOSPITAL_COMMUNITY): Payer: Self-pay | Admitting: Certified Registered Nurse Anesthetist

## 2017-10-11 DIAGNOSIS — I340 Nonrheumatic mitral (valve) insufficiency: Secondary | ICD-10-CM

## 2017-10-11 HISTORY — PX: CARDIOVERSION: SHX1299

## 2017-10-11 HISTORY — PX: TEE WITHOUT CARDIOVERSION: SHX5443

## 2017-10-11 LAB — BASIC METABOLIC PANEL
ANION GAP: 13 (ref 5–15)
BUN: 17 mg/dL (ref 6–20)
CALCIUM: 8.9 mg/dL (ref 8.9–10.3)
CO2: 31 mmol/L (ref 22–32)
CREATININE: 1.06 mg/dL — AB (ref 0.44–1.00)
Chloride: 94 mmol/L — ABNORMAL LOW (ref 101–111)
GFR calc Af Amer: 57 mL/min — ABNORMAL LOW (ref >60)
GFR calc non Af Amer: 49 mL/min — ABNORMAL LOW (ref >60)
GLUCOSE: 136 mg/dL — AB (ref 65–99)
Potassium: 3.6 mmol/L (ref 3.5–5.1)
Sodium: 138 mmol/L (ref 135–145)

## 2017-10-11 LAB — CBC WITH DIFFERENTIAL/PLATELET
Abs Immature Granulocytes: 0.1 10*3/uL (ref 0.0–0.1)
BASOS PCT: 0 %
Basophils Absolute: 0.1 10*3/uL (ref 0.0–0.1)
EOS ABS: 0.1 10*3/uL (ref 0.0–0.7)
EOS PCT: 1 %
HCT: 36 % (ref 36.0–46.0)
Hemoglobin: 11.6 g/dL — ABNORMAL LOW (ref 12.0–15.0)
Immature Granulocytes: 1 %
Lymphocytes Relative: 17 %
Lymphs Abs: 1.9 10*3/uL (ref 0.7–4.0)
MCH: 31.8 pg (ref 26.0–34.0)
MCHC: 32.2 g/dL (ref 30.0–36.0)
MCV: 98.6 fL (ref 78.0–100.0)
MONO ABS: 1.6 10*3/uL — AB (ref 0.1–1.0)
MONOS PCT: 14 %
Neutro Abs: 7.8 10*3/uL — ABNORMAL HIGH (ref 1.7–7.7)
Neutrophils Relative %: 67 %
PLATELETS: 248 10*3/uL (ref 150–400)
RBC: 3.65 MIL/uL — ABNORMAL LOW (ref 3.87–5.11)
RDW: 14 % (ref 11.5–15.5)
WBC: 11.5 10*3/uL — ABNORMAL HIGH (ref 4.0–10.5)

## 2017-10-11 SURGERY — ECHOCARDIOGRAM, TRANSESOPHAGEAL
Anesthesia: General

## 2017-10-11 MED ORDER — CARVEDILOL 6.25 MG PO TABS: 6.2500 mg | ORAL_TABLET | Freq: Two times a day (BID) | ORAL | Status: DC

## 2017-10-11 MED ORDER — BUTAMBEN-TETRACAINE-BENZOCAINE 2-2-14 % EX AERO
INHALATION_SPRAY | CUTANEOUS | Status: DC | PRN
  Administered 2017-10-11: 2 via TOPICAL

## 2017-10-11 MED ORDER — LACTATED RINGERS IV SOLN
INTRAVENOUS | Status: DC
  Administered 2017-10-11: 08:00:00 via INTRAVENOUS

## 2017-10-11 MED ORDER — PROPOFOL 500 MG/50ML IV EMUL
INTRAVENOUS | Status: DC | PRN
  Administered 2017-10-11: 25 ug/kg/min via INTRAVENOUS

## 2017-10-11 MED ORDER — COLESTIPOL HCL 1 G PO TABS
1.0000 g | ORAL_TABLET | Freq: Every day | ORAL | Status: DC
  Administered 2017-10-11 – 2017-10-12 (×2): 1 g via ORAL
  Filled 2017-10-11 (×2): qty 1

## 2017-10-11 MED ORDER — LOSARTAN POTASSIUM 25 MG PO TABS
25.0000 mg | ORAL_TABLET | Freq: Every day | ORAL | Status: DC
  Administered 2017-10-11 – 2017-10-12 (×2): 25 mg via ORAL
  Filled 2017-10-11 (×2): qty 1

## 2017-10-11 MED ORDER — CARVEDILOL 12.5 MG PO TABS
12.5000 mg | ORAL_TABLET | Freq: Two times a day (BID) | ORAL | Status: DC
  Administered 2017-10-11 – 2017-10-12 (×2): 12.5 mg via ORAL
  Filled 2017-10-11 (×2): qty 1

## 2017-10-11 MED ORDER — POTASSIUM CHLORIDE CRYS ER 20 MEQ PO TBCR
40.0000 meq | EXTENDED_RELEASE_TABLET | Freq: Once | ORAL | Status: AC
  Administered 2017-10-11: 40 meq via ORAL
  Filled 2017-10-11: qty 2

## 2017-10-11 MED ORDER — PROPOFOL 10 MG/ML IV BOLUS
INTRAVENOUS | Status: DC | PRN
  Administered 2017-10-11 (×4): 10 mg via INTRAVENOUS

## 2017-10-11 NOTE — CV Procedure (Signed)
    Transesophageal Echocardiogram Note  [name] [name] [id number] 02/21/1941  Procedure: Transesophageal Echocardiogram Indications: atrial fib   Procedure Details Consent: Obtained Time Out: Verified patient identification, verified procedure, site/side was marked, verified correct patient position, special equipment/implants available, Radiology Safety Procedures followed,  medications/allergies/relevent history reviewed, required imaging and test results available.  Performed  Medications:  During this procedure the patient is administered a propofol drip - total of 150 mg total for TEE and cardioverson by Raynelle Fanning, CRNA   Left Ventrical:  Moderate LV dysfunction.   EF 35%  Mitral Valve: mod- severe MR .   PISA radius is 0.8 cm  Aortic Valve: normal ,   Tricuspid Valve: mod. TR   Pulmonic Valve: trivial PI   Left Atrium/ Left atrial appendage: no thrombi - imaged in 2 views   Atrial septum: no obvious ASD or PFO by color doppler .Marland Kitchen  Atrial septum bows left to right  Aorta: mild calcified plaue    Complications: No apparent complications Patient did tolerate procedure well.     Cardioversion Note  [name] [name] [id number] 1940/12/24  Procedure: DC Cardioversion Indications: atrial fib   Procedure Details Consent: Obtained Time Out: Verified patient identification, verified procedure, site/side was marked, verified correct patient position, special equipment/implants available, Radiology Safety Procedures followed,  medications/allergies/relevent history reviewed, required imaging and test results available.  Performed  The patient has been on adequate anticoagulation.  The patient received IV Propofol drip ( see above)  for sedation.  Synchronous cardioversion was performed at 200  joules.  The cardioversion was successful     Complications: No apparent complications Patient did tolerate procedure well.   [name], Montez Hageman., MD, [facility] 10/11/2017, 8:43  AM

## 2017-10-11 NOTE — Progress Notes (Signed)
  Echocardiogram Echocardiogram Transesophageal has been performed.  Pieter Partridge 10/11/2017, 8:42 AM

## 2017-10-11 NOTE — Transfer of Care (Signed)
Immediate Anesthesia Transfer of Care Note  Patient: [name] [name]  Procedure(s) Performed: TRANSESOPHAGEAL ECHOCARDIOGRAM (TEE) (N/A ) CARDIOVERSION (N/A )  Patient Location: Endoscopy Unit  Anesthesia Type:MAC  Level of Consciousness: awake, alert  and oriented  Airway & Oxygen Therapy: Patient Spontanous Breathing and Patient connected to nasal cannula oxygen  Post-op Assessment: Report given to RN  Post vital signs: Reviewed and stable  Last Vitals:  Vitals Value Taken Time  BP 126/89 10/11/2017  8:55 AM  Temp 36.4 C 10/11/2017  8:55 AM  Pulse 78 10/11/2017  8:58 AM  Resp 23 10/11/2017  8:58 AM  SpO2 93 % 10/11/2017  8:58 AM  Vitals shown include unvalidated device data.  Last Pain:  Vitals:   10/11/17 0855  TempSrc: Oral  PainSc: 0-No pain      Patients Stated Pain Goal: 0 (85/88/50 2774)  Complications: No apparent anesthesia complications

## 2017-10-11 NOTE — Anesthesia Postprocedure Evaluation (Signed)
Anesthesia Post Note  Patient: [name] [name]  Procedure(s) Performed: TRANSESOPHAGEAL ECHOCARDIOGRAM (TEE) (N/A ) CARDIOVERSION (N/A )     Patient location during evaluation: PACU Anesthesia Type: General Level of consciousness: awake and alert Pain management: pain level controlled Vital Signs Assessment: post-procedure vital signs reviewed and stable Respiratory status: spontaneous breathing, nonlabored ventilation, respiratory function stable and patient connected to nasal cannula oxygen Cardiovascular status: blood pressure returned to baseline and stable Postop Assessment: no apparent nausea or vomiting Anesthetic complications: no    Last Vitals:  Vitals:   10/11/17 0933 10/11/17 1225  BP: (!) 145/66 114/83  Pulse: 84 72  Resp:  18  Temp:  36.4 C  SpO2:  91%    Last Pain:  Vitals:   10/11/17 1225  TempSrc: Oral  PainSc:                  Kennieth Rad

## 2017-10-11 NOTE — Progress Notes (Signed)
Reported by Tele that pt had a 9 beats run of SVT. Pt was asymptomatic.  Pt was sitting on side of the bed talking on a phone at that time. DrMarland Kitchen Jarvis Newcomer made aware.  Hinton Dyer, RN

## 2017-10-11 NOTE — Progress Notes (Signed)
PROGRESS NOTE  Arnold Depinto  ZOX:[id number] DOB: 1940/12/24 DOA: 10/07/2017 PCP: Leanora Ivanoff., MD   Brief Narrative: Taqwa Deem is a 77 y.o. female with a history of polymyalgia rheumatica, peripheral neuropathy who presented to [facility] with abrupt onset dyspnea found to have (new Dx) AFib in RVR, transferred to [facility] for admission, cardiology consultation. Rate was controlled with diltiazem gtt and metoprolol, though echocardiogram revealed LV dysfunction, and medications were changed to coreg. Eliquis was started and TEE/DCCV was performed 5/29, currently maintaining sinus rhythm with improvement in symptoms.   Assessment & Plan: Active Problems:   Atrial fibrillation with RVR (HCC)   HTN (hypertension)   Neuropathy   Atrial fibrillation with rapid ventricular response (HCC)   DCM (dilated cardiomyopathy) (HCC)   Acute systolic CHF (congestive heart failure) (HCC)  Acute HFrEF: EF5 - 50% with diffuse hypokinesis. - Continue lasix  IV BID x1 more day - Continue ARB - Sub out lopressor and diltiazem for coreg - Continue strict I/O, daily weights (reviewed this with pt and husband) - Suspect an element of venous insufficiency contributing to LE edema  Atrial fibrillation with RVR: Now resolved. NSR following DCCV 5/29.  - Continue coreg - Started eliquis (CHA2DS2-VASc Scoreis 5)  - Maintain K > 4 (has had hypokalemia, giving K today) and Mg > 2. TSH 2.315.   Troponin elevation: Not consistent with ACS, suspect demand ischemia from rapid AFib.  - No further interventions per cardiology.  - Consider LHC if LV dysfunction remains despite conversion to NSR.  Wheezing: With 30 years of tobacco use (quit 11 years ago), suspect some COPD though hasn't been diagnosed with this.  - Recommend outpatient PFTs - Continue nebs (xopenex for albuterol)  HTN:  - Continue medications, at goal  Polymyalgia rheumatica:  - Continue home prednisone (no indication for stress  dosing)  Peripheral neuropathy: Not due to diabetes. Followed as outpatient - Stable. Continue gabapentin  Anemia of chronic disease: Hgb stable.  - Monitor at follow up with start of anticoagulation  Morbid obesity:  - Noted. Wt stable.   DVT prophylaxis: Eliquis Code Status: Full Family Communication: Husband at bedside Disposition Plan: Anticipate DC home in next 24 hours  Consultants:   Cardiology  Procedures:  2D echo 10/08/2017 Study Conclusions  - Left ventricle: The cavity size was normal. Wall thickness was increased in a pattern of mild LVH. Systolic function was mildly reduced. The estimated ejection fraction was in the range of 45% to 50%. Diffuse hypokinesis. The study is not technically sufficient to allow evaluation of LV diastolic function. Ejection fraction (MOD, 2-plane): 47%. - Aortic valve: Trileaflet. Sclerosis without stenosis. There was no regurgitation. - Mitral valve: Mildly thickened leaflets . There was moderate regurgitation. - Left atrium: The atrium was normal in size. - Inferior vena cava: The vessel was dilated. The respirophasic diameter changes were blunted (<50%), consistent with elevated central venous pressure.  Impressions:  - LVEF 45-50%, mild LVH, global hypokinesis, moderate MR, normal LA size, dilated IVC.   TEE 10/11/2017 Left Ventricle:Moderate LV dysfunction. EF 35%  Mitral Valve:mod- severe MR . PISA radius is 0.8 cm  Aortic Valve:normal ,  Tricuspid Valve:mod. TR  Pulmonic Valve:trivial PI  Left Atrium/ Left atrial appendage:no thrombi - imaged in 2 views  Atrial septum:no obvious ASD or PFO by color doppler .Marland Kitchen Atrial septum bows left to right  Aorta:mild calcified plaue  Antimicrobials:  None   Subjective: Feels much better following TEE. Dyspnea improved, no chest pain or  palpitations.   Objective: Vitals:   10/11/17 0900 10/11/17 0907 10/11/17 0933  10/11/17 1225  BP:  114/73 (!) 145/66 114/83  Pulse: 78 77 84 72  Resp: (!) 21 (!) 27  18  Temp:    97.6 F (36.4 C)  TempSrc:    Oral  SpO2: 93% 97%  91%  Weight:      Height:        Intake/Output Summary (Last 24 hours) at 10/11/2017 1559 Last data filed at 10/11/2017 1500 Gross per 24 hour  Intake 2235.75 ml  Output 1600 ml  Net 635.75 ml   Filed Weights   10/10/17 0538 10/11/17 0500 10/11/17 0725  Weight: 131.9 kg (290 lb 12.8 oz) 131.5 kg (289 lb 14.4 oz) 131.5 kg (289 lb 14.4 oz)    Gen: Pleasant, morbidly obese female in no distress  Pulm: Non-labored breathing. Clear, mildly diminished CV: Regular rate and rhythm. No murmur, rub, or gallop. No JVD, 1+ bilateraly pitting pedal edema. GI: Abdomen soft, non-tender, non-distended, with normoactive bowel sounds. No organomegaly or masses felt. Ext: Warm, no deformities Skin: No rashes, lesions no ulcers Neuro: Alert and oriented. No focal neurological deficits. Psych: Judgement and insight appear normal. Mood & affect appropriate.   Data Reviewed: I have personally reviewed following labs and imaging studies  CBC: Recent Labs  Lab 10/07/17 1124 10/08/17 0609 10/09/17 0048 10/10/17 0719 10/11/17 0545  WBC 11.2* 10.9* 12.3* 10.5 11.5*  NEUTROABS 7.7  --   --   --  7.8*  HGB 13.8 11.6* 11.8* 11.3* 11.6*  HCT 40.8 37.3 35.8* 34.8* 36.0  MCV 98.8 101.6* 98.4 98.3 98.6  PLT 230 204 175 209 248   Basic Metabolic Panel: Recent Labs  Lab 10/07/17 1124 10/08/17 0609 10/09/17 0946 10/10/17 0719 10/11/17 0545  NA 139 140 134* 136 138  K 3.5 3.7 3.8 3.4* 3.6  CL 103 101 95* 96* 94*  CO2 GLUCOSE 159* 130* 140* 121* 136*  BUN CREATININE 1.00 1.08* 1.10* 1.08* 1.06*  CALCIUM 8.6* 8.4* 8.6* 8.6* 8.9   GFR: Estimated Creatinine Clearance: 59 mL/min (A) (by C-G formula based on SCr of 1.06 mg/dL (H)). Liver Function Tests: Recent Labs  Lab 10/07/17 1124 10/08/17 0609  AST 62* 59*   ALT 62* 64*  ALKPHOS 63 52  BILITOT 0.6 0.8  PROT 7.3 6.2*  ALBUMIN 4.1 3.4*   Recent Labs  Lab 10/07/17 1124  LIPASE 30   No results for input(s): AMMONIA in the last 168 hours. Coagulation Profile: No results for input(s): INR, PROTIME in the last 168 hours. Cardiac Enzymes: Recent Labs  Lab 10/07/17 1124 10/07/17 1835 10/08/17 0331 10/08/17 0609  TROPONINI 0.04* 0.04* 0.05* 0.04*   BNP (last 3 results) No results for input(s): PROBNP in the last 8760 hours. HbA1C: No results for input(s): HGBA1C in the last 72 hours. CBG: No results for input(s): GLUCAP in the last 168 hours. Lipid Profile: No results for input(s): CHOL, HDL, LDLCALC, TRIG, CHOLHDL, LDLDIRECT in the last 72 hours. Thyroid Function Tests: No results for input(s): TSH, T4TOTAL, FREET4, T3FREE, THYROIDAB in the last 72 hours. Anemia Panel: No results for input(s): VITAMINB12, FOLATE, FERRITIN, TIBC, IRON, RETICCTPCT in the last 72 hours. Urine analysis: No results found for: COLORURINE, APPEARANCEUR, LABSPEC, PHURINE, GLUCOSEU, HGBUR, BILIRUBINUR, KETONESUR, PROTEINUR, UROBILINOGEN, NITRITE, LEUKOCYTESUR Recent Results (from the past 240 hour(s))  MRSA PCR Screening     Status: None  Collection Time: 10/09/17  8:09 PM  Result Value Ref Range Status   MRSA by PCR NEGATIVE NEGATIVE Final    Comment:        The GeneXpert MRSA Assay (FDA approved for NASAL specimens only), is one component of a comprehensive MRSA colonization surveillance program. It is not intended to diagnose MRSA infection nor to guide or monitor treatment for MRSA infections. Performed at [facility], [address], [place], [place] [address]       Radiology Studies: No results found.  Scheduled Meds: . apixaban  5 mg Oral BID  . calcium carbonate  1 tablet Oral BID WC  . carvedilol  12.5 mg Oral BID WC  . colestipol  1 g Oral Daily  . furosemide  80 mg Intravenous BID  . gabapentin  300 mg Oral QHS  .  losartan  25 mg Oral Daily  . predniSONE  10 mg Oral Q breakfast  . sodium chloride flush  3 mL Intravenous Q12H  . sodium chloride flush  3 mL Intravenous Q12H   Continuous Infusions: . sodium chloride    . sodium chloride       LOS: 4 days   Time spent: 25 minutes.  Tyrone Nine, MD [facility] [email or web address] Password [facility] 10/11/2017, 3:59 PM

## 2017-10-11 NOTE — Care Management Important Message (Signed)
Important Message  Patient Details  Name: Eldena Dede MRN: [id number] Date of Birth: [birth year]   Medicare Important Message Given:  Yes    Oralia Rud Erling Arrazola 10/11/2017, 2:11 PM

## 2017-10-11 NOTE — Interval H&P Note (Signed)
History and Physical Interval Note:  10/11/2017 8:03 AM  [name] [name]  has presented today for surgery, with the diagnosis of A-FIB  The various methods of treatment have been discussed with the patient and family. After consideration of risks, benefits and other options for treatment, the patient has consented to  Procedure(s): TRANSESOPHAGEAL ECHOCARDIOGRAM (TEE) (N/A) CARDIOVERSION (N/A) as a surgical intervention .  The patient's history has been reviewed, patient examined, no change in status, stable for surgery.  I have reviewed the patient's chart and labs.  Questions were answered to the patient's satisfaction.     Kristeen Miss

## 2017-10-11 NOTE — Anesthesia Procedure Notes (Signed)
Procedure Name: MAC Date/Time: 10/11/2017 8:13 AM Performed by: Barrington Ellison, CRNA Pre-anesthesia Checklist: Patient identified, Emergency Drugs available, Suction available, Patient being monitored and Timeout performed Patient Re-evaluated:Patient Re-evaluated prior to induction Oxygen Delivery Method: Nasal cannula

## 2017-10-11 NOTE — Plan of Care (Signed)
  Problem: Health Behavior/Discharge Planning: Goal: Ability to manage health-related needs will improve Outcome: Progressing   Problem: Clinical Measurements: Goal: Ability to maintain clinical measurements within normal limits will improve Outcome: Progressing   Problem: Safety: Goal: Ability to remain free from injury will improve Outcome: Progressing   Problem: Skin Integrity: Goal: Risk for impaired skin integrity will decrease Outcome: Progressing   

## 2017-10-11 NOTE — Progress Notes (Addendum)
Progress Note  Patient Name: [name] [name] Date of Encounter: 10/11/2017  Primary Cardiologist: No primary care provider on file.   Subjective   Denies any chest pain or SOB.  S/P successful DCCV to NSR after TEE  Inpatient Medications    Scheduled Meds: . apixaban  5 mg Oral BID  . calcium carbonate  1 tablet Oral BID WC  . colestipol  1 g Oral Daily  . furosemide  80 mg Intravenous BID  . gabapentin  300 mg Oral QHS  . metoprolol tartrate  37.5 mg Oral Q6H  . predniSONE  10 mg Oral Q breakfast  . sodium chloride flush  3 mL Intravenous Q12H  . sodium chloride flush  3 mL Intravenous Q12H   Continuous Infusions: . sodium chloride    . sodium chloride    . diltiazem (CARDIZEM) infusion 15 mg/hr (10/11/17 0612)   PRN Meds: sodium chloride, acetaminophen **OR** acetaminophen, HYDROcodone-acetaminophen, ipratropium, levalbuterol, metoprolol tartrate, ondansetron **OR** ondansetron (ZOFRAN) IV, sodium chloride flush, sodium chloride flush   Vital Signs    Vitals:   10/11/17 0900 10/11/17 0907 10/11/17 0933 10/11/17 1225  BP:  114/73 (!) 145/66 114/83  Pulse: 78 77 84 72  Resp: (!) 21 (!) 27  18  Temp:    97.6 F (36.4 C)  TempSrc:    Oral  SpO2: 93% 97%  91%  Weight:      Height:        Intake/Output Summary (Last 24 hours) at 10/11/2017 1332 Last data filed at 10/11/2017 0935 Gross per 24 hour  Intake 1759.25 ml  Output 1600 ml  Net 159.25 ml   Filed Weights   10/10/17 0538 10/11/17 0500 10/11/17 0725  Weight: 290 lb 12.8 oz (131.9 kg) 289 lb 14.4 oz (131.5 kg) 289 lb 14.4 oz (131.5 kg)    Telemetry    NSR - Personally Reviewed  ECG    NSR with nonspecific ST abnormalit - Personally Reviewed  Physical Exam   GEN: No acute distress.   Neck: No JVD Cardiac: RRR, no murmurs, rubs, or gallops.  Respiratory: Clear to auscultation bilaterally. GI: Soft, nontender, non-distended  MS: No edema; No deformity. Neuro:  Nonfocal  Psych: Normal affect    Labs    Chemistry Recent Labs  Lab 10/07/17 1124 10/08/17 0609 10/09/17 0946 10/10/17 0719 10/11/17 0545  NA 139 140 134* 136 138  K 3.5 3.7 3.8 3.4* 3.6  CL 103 101 95* 96* 94*  CO2 GLUCOSE 159* 130* 140* 121* 136*  BUN CREATININE 1.00 1.08* 1.10* 1.08* 1.06*  CALCIUM 8.6* 8.4* 8.6* 8.6* 8.9  PROT 7.3 6.2*  --   --   --   ALBUMIN 4.1 3.4*  --   --   --   AST 62* 59*  --   --   --   ALT 62* 64*  --   --   --   ALKPHOS 63 52  --   --   --   BILITOT 0.6 0.8  --   --   --   GFRNONAA 53* 48* 47* 48* 49*  GFRAA >60 55* 54* 55* 57*  ANIONGAP Hematology Recent Labs  Lab 10/09/17 0048 10/10/17 0719 10/11/17 0545  WBC 12.3* 10.5 11.5*  RBC 3.64* 3.54* 3.65*  HGB 11.8* 11.3* 11.6*  HCT 35.8* 34.8* 36.0  MCV 98.4 98.3 98.6  MCH  32.4 31.9 31.8  MCHC 33.0 32.5 32.2  RDW 13.6 13.7 14.0  PLT 175 209 248    Cardiac Enzymes Recent Labs  Lab 10/07/17 1124 10/07/17 1835 10/08/17 0331 10/08/17 0609  TROPONINI 0.04* 0.04* 0.05* 0.04*   No results for input(s): TROPIPOC in the last 168 hours.   BNP Recent Labs  Lab 10/07/17 1124  BNP 920.6*     DDimer No results for input(s): DDIMER in the last 168 hours.   Radiology    No results found.  Cardiac Studies   2D echo 10/08/2017 Study Conclusions  - Left ventricle: The cavity size was normal. Wall thickness was increased in a pattern of mild LVH. Systolic function was mildly reduced. The estimated ejection fraction was in the range of 45% to 50%. Diffuse hypokinesis. The study is not technically sufficient to allow evaluation of LV diastolic function. Ejection fraction (MOD, 2-plane): 47%. - Aortic valve: Trileaflet. Sclerosis without stenosis. There was no regurgitation. - Mitral valve: Mildly thickened leaflets . There was moderate regurgitation. - Left atrium: The atrium was normal in size. - Inferior vena cava: The vessel was dilated.  The respirophasic diameter changes were blunted (<50%), consistent with elevated central venous pressure.  Impressions:  - LVEF 45-50%, mild LVH, global hypokinesis, moderate MR, normal LA size, dilated IVC.   TEE 10/11/2017 Left Ventricle:  Moderate LV dysfunction.   EF 35%  Mitral Valve: mod- severe MR .   PISA radius is 0.8 cm  Aortic Valve: normal ,   Tricuspid Valve: mod. TR   Pulmonic Valve: trivial PI   Left Atrium/ Left atrial appendage: no thrombi - imaged in 2 views   Atrial septum: no obvious ASD or PFO by color doppler .Marland Kitchen  Atrial septum bows left to right  Aorta: mild calcified plaue    Patient Profile     77 y.o. female with a hx of morbid obesity, HTN, PMR,who is being seen today for the evaluation ofAFRVRat the request of Dr. Sunnie Nielsen  Assessment & Plan    1. Atrial fibrillation with RVR -s/p TEE/DCCV to NSR earlier today -change lopressor to Carvedilol 12.5mg  BID due to LV dysfunction -Continue Eliquis 5 mg twice daily  -Cardizem gtt stopped due to LV dysfunction  2. Hypertension -BP well controlled on exam today at 134/86 mmHg -Cardizem stopped due to LV dysfunction -changing lopressor to carvedilol 12.5mg  BID due to LV dysfunction -Add losartan  daily  3. Elevated troponin -Troponin minimally elevated at 0.043 and 0.05 likely related to demand ischemia in the setting of A. fib with RVR -2D echo showed mild LV dysfunction with EF 45 to 50% with diffuse hypokinesis and TEE showed EF 35% likely related to tachycardia induced cardiomyopathy. Also moderate MR. -Repeat 2D echocardiogram once she is back in normal sinus rhythm for 1 to 2 months.  If LVF remains decreased then will need ischemic workup.  4. Acute systolic CHF -BNP was elevated at 520 -UOP picked up yesterday after increasing diuretic.   -UOP 1.9L yesterday and is net neg 16cc.   -Weight is down 3 pounds from admission -creatinine stable at 1.06 today.    -continue IV Lasix today and change to PO tomorrow -replete K+ with Kdur now and repeat BMET in am    For questions or updates, please contact [facility] Please consult [email or web address] for contact info under Cardiology/STEMI.      Signed, Armanda Magic, MD  10/11/2017, 1:32 PM

## 2017-10-12 ENCOUNTER — Other Ambulatory Visit: Payer: Self-pay | Admitting: Cardiology

## 2017-10-12 DIAGNOSIS — G629 Polyneuropathy, unspecified: Secondary | ICD-10-CM

## 2017-10-12 DIAGNOSIS — N289 Disorder of kidney and ureter, unspecified: Secondary | ICD-10-CM

## 2017-10-12 LAB — BASIC METABOLIC PANEL
ANION GAP: 11 (ref 5–15)
BUN: 17 mg/dL (ref 6–20)
CALCIUM: 8.4 mg/dL — AB (ref 8.9–10.3)
CO2: 34 mmol/L — AB (ref 22–32)
Chloride: 94 mmol/L — ABNORMAL LOW (ref 101–111)
Creatinine, Ser: 1.1 mg/dL — ABNORMAL HIGH (ref 0.44–1.00)
GFR calc non Af Amer: 47 mL/min — ABNORMAL LOW (ref >60)
GFR, EST AFRICAN AMERICAN: 54 mL/min — AB (ref >60)
Glucose, Bld: 129 mg/dL — ABNORMAL HIGH (ref 65–99)
POTASSIUM: 3.1 mmol/L — AB (ref 3.5–5.1)
Sodium: 139 mmol/L (ref 135–145)

## 2017-10-12 LAB — CBC
HCT: 32.2 % — ABNORMAL LOW (ref 36.0–46.0)
HEMOGLOBIN: 10.5 g/dL — AB (ref 12.0–15.0)
MCH: 32.2 pg (ref 26.0–34.0)
MCHC: 32.6 g/dL (ref 30.0–36.0)
MCV: 98.8 fL (ref 78.0–100.0)
Platelets: 216 10*3/uL (ref 150–400)
RBC: 3.26 MIL/uL — AB (ref 3.87–5.11)
RDW: 13.6 % (ref 11.5–15.5)
WBC: 8.3 10*3/uL (ref 4.0–10.5)

## 2017-10-12 MED ORDER — POTASSIUM CHLORIDE CRYS ER 20 MEQ PO TBCR: 40.0000 meq | EXTENDED_RELEASE_TABLET | Freq: Once | ORAL | Status: DC

## 2017-10-12 MED ORDER — POTASSIUM CHLORIDE CRYS ER 20 MEQ PO TBCR
40.0000 meq | EXTENDED_RELEASE_TABLET | Freq: Two times a day (BID) | ORAL | Status: DC
  Administered 2017-10-12: 40 meq via ORAL
  Filled 2017-10-12: qty 2

## 2017-10-12 MED ORDER — POTASSIUM CHLORIDE CRYS ER 20 MEQ PO TBCR
40.0000 meq | EXTENDED_RELEASE_TABLET | Freq: Once | ORAL | Status: AC
  Administered 2017-10-12: 40 meq via ORAL
  Filled 2017-10-12: qty 2

## 2017-10-12 MED ORDER — FUROSEMIDE 40 MG PO TABS: 40.0000 mg | ORAL_TABLET | Freq: Two times a day (BID) | ORAL | Status: DC

## 2017-10-12 MED ORDER — CARVEDILOL 25 MG PO TABS: 25.0000 mg | ORAL_TABLET | Freq: Two times a day (BID) | ORAL | Status: DC

## 2017-10-12 MED ORDER — CARVEDILOL 25 MG PO TABS: 25.0000 mg | ORAL_TABLET | Freq: Two times a day (BID) | ORAL | 0 refills | Status: DC

## 2017-10-12 MED ORDER — LOSARTAN POTASSIUM 25 MG PO TABS: 25.0000 mg | ORAL_TABLET | Freq: Every day | ORAL | 0 refills | Status: DC

## 2017-10-12 MED ORDER — FUROSEMIDE 40 MG PO TABS: 40.0000 mg | ORAL_TABLET | Freq: Two times a day (BID) | ORAL | 0 refills | Status: DC

## 2017-10-12 MED ORDER — CARVEDILOL 12.5 MG PO TABS
12.5000 mg | ORAL_TABLET | Freq: Once | ORAL | Status: AC
  Administered 2017-10-12: 12.5 mg via ORAL
  Filled 2017-10-12: qty 1

## 2017-10-12 MED ORDER — POTASSIUM CHLORIDE ER 20 MEQ PO TBCR: 40.0000 meq | EXTENDED_RELEASE_TABLET | Freq: Every day | ORAL | 0 refills | Status: DC

## 2017-10-12 MED ORDER — CARVEDILOL 12.5 MG PO TABS: 12.5000 mg | ORAL_TABLET | Freq: Two times a day (BID) | ORAL | Status: DC

## 2017-10-12 MED ORDER — APIXABAN 5 MG PO TABS: 5.0000 mg | ORAL_TABLET | Freq: Two times a day (BID) | ORAL | 0 refills | Status: DC

## 2017-10-12 MED ORDER — POTASSIUM CHLORIDE CRYS ER 20 MEQ PO TBCR: 40.0000 meq | EXTENDED_RELEASE_TABLET | Freq: Every day | ORAL | Status: DC

## 2017-10-12 NOTE — Care Management Note (Signed)
Case Management Note  Patient Details  Name: [name] MRN: [id number] Date of Birth: February 10, 1941  Subjective/Objective: Pt presented for A fib RVR- Initially on Cardizem gtt- successful TEE/ Cardioversion. PTA from home with husband. Plan will be to transition home today with husband and [facility]. Agency List provided and pt chose [facility] for [facility] and DME 02.                   Action/Plan: CM did call the referral to [facility] with [facility]. DME 02 will e delivered to room prior to transition home and [facility] for [facility] Services will begin within 24-48 hours post transition home. Husband to provide transportation home. No further needs from CM at this time.   Expected Discharge Date:  10/12/17               Expected Discharge Plan:  Home w Home Health Services  In-House Referral:  NA  Discharge planning Services  CM Consult  Post Acute Care Choice:  Durable Medical Equipment, Home Health Choice offered to:  Patient  DME Arranged:  Oxygen DME Agency:  [facility]  HH Arranged:  PT, OT [facility] Agency:  [facility]  Status of Service:  Completed, signed off  If discussed at Long Length of Stay Meetings, dates discussed:    Additional Comments:  Gala Lewandowsky, RN 10/12/2017, 12:20 PM

## 2017-10-12 NOTE — Discharge Summary (Signed)
Physician Discharge Summary  [name] [name] ZOX:[id number] DOB: [birth year] DOA: 10/07/2017  PCP: Leanora Ivanoff., MD  Admit date: 10/07/2017 Discharge date: 10/12/2017  Admitted From: Home Disposition: Home   Recommendations for Outpatient Follow-up:  1. Follow up with PCP in 1-2 weeks. Consider PFTs and empiric Tx COPD (long term smoker). 2. Please obtain BMP within the next week. Follow up with cardiology as scheduled.  Home Health: PT, OT Equipment/Devices: O2 Discharge Condition: Stable CODE STATUS: Full Diet recommendation: Heart healthy  Brief/Interim Summary: [name] [name] is a 77 y.o. female with a history of polymyalgia rheumatica, peripheral neuropathy who presented to [facility] with abrupt onset dyspnea found to have (new Dx) AFib in RVR, transferred to [facility] for admission, cardiology consultation. Rate was controlled with diltiazem gtt and metoprolol, though echocardiogram revealed LV dysfunction, and medications were changed to coreg. Eliquis was started and TEE/DCCV was performed 5/29, currently maintaining sinus rhythm with improvement in symptoms. She has grown more deconditioned and will require home health services and oxygen at discharge.   Discharge Diagnoses:  Active Problems:   Atrial fibrillation with RVR (HCC)   HTN (hypertension)   Neuropathy   Atrial fibrillation with rapid ventricular response (HCC)   DCM (dilated cardiomyopathy) (HCC)   Acute systolic CHF (congestive heart failure) (HCC)  Acute HFrEF: EF45 - 50% with diffuse hypokinesis. - Continue medications as below. Appears nearly euvolemic. Daily weights and return precautions reviewed with patient and husband. - Suspect an element of venous insufficiency contributing to LE edema  Per cardiology note on day of discharge: "Patient is stable from a cardiac standpoint for discharge to home.  We will set her up with early follow-up in our office in 7 to 10 days.  She will have a bmet on Monday.  Will sign  off please call with any questions Discharge cardiac medications include:             Apixaban 5 mg twice daily             carvedilol 25 mg twice daily             Lasix 40 mg p.o. twice daily             Cozaar 25 mg daily             K-Dur 40 mEq daily"  Atrial fibrillation with RVR: Now resolved. NSR following DCCV 5/29 with one episode of AFib w/aberrancy.  - Continue coreg, increasing dose for arrhythmia suppression. BP stable. - Started eliquis (CHA2DS2-VASc Scoreis 5). Please follow up CBC/signs of bleeding. - Maintain K > 4 (has had hypokalemia, giving K daily) and Mg > 2. TSH 2.315.   Troponin elevation: Not consistent with ACS, suspect demand ischemia from rapid AFib.  - No further interventions per cardiology.  - Consider LHC if LV dysfunction remains despite conversion to NSR.  Wheezing: With 30 years of tobacco use (quit 11 years ago), suspect some COPD though hasn't been diagnosed with this.  - Recommend outpatient PFTs - Follow up with PCP.   HTN:  - Continue medications, at goal  Polymyalgia rheumatica:  - Continue home prednisone (no indication for stress dosing). Restart home MTX as scheduled (Wednesdays)  Peripheral neuropathy: Not due to diabetes. Followed as outpatient - Stable. Continue gabapentin  Anemia of chronic disease: Hgb stable.  - Monitor at follow up with start of anticoagulation  Morbid obesity:  - Noted. Wt stable.   Discharge Instructions Discharge Instructions    (HEART  FAILURE PATIENTS) Call MD:  Anytime you have any of the following symptoms: 1) 3 pound weight gain in 24 hours or 5 pounds in 1 week 2) shortness of breath, with or without a dry hacking cough 3) swelling in the hands, feet or stomach 4) if you have to sleep on extra pillows at night in order to breathe.   Complete by:  As directed    Call MD for:  difficulty breathing, headache or visual disturbances   Complete by:  As directed    Call MD for:  persistant  dizziness or light-headedness   Complete by:  As directed    Diet - low sodium heart healthy   Complete by:  As directed    Discharge instructions   Complete by:  As directed    You were hospitalized for heart failure due to atrial fibrillation which has been treated with cardioversion and medications. There are several changes to medications to be noted below. - Start Apixaban 5 mg twice daily to reduce risk of stroke due to AFib - Replace metoprolol with carvedilol 25 mg twice daily - Change dosing of lasix to Lasix 40 mg twice daily - Start Cozaar 25 mg daily to help with blood pressure and remodeling the heart - Increase potassium to K-Dur 40 mEq daily - You will receive home health PT and OT services and continue using oxygen at 2 liters until you follow up with your PCP, which you should schedule in the next 1-2 weeks.  - If you don't hear from the cardiology clinic in the next few days, call the number listed to schedule an appointment.   Increase activity slowly   Complete by:  As directed      Allergies as of 10/12/2017      Reactions   Fluoxetine Other (See Comments)   Altered mental status   Adhesive [tape] Rash   Cannot tolerate any tape or bandaids more than 24 hrs.   Demerol [meperidine Hcl] Rash   Sulfa Antibiotics Rash      Medication List    STOP taking these medications   cephALEXin 500 MG capsule Commonly known as:  KEFLEX   metoprolol tartrate 25 MG tablet Commonly known as:  LOPRESSOR   nitrofurantoin (macrocrystal-monohydrate) 100 MG capsule Commonly known as:  MACROBID     TAKE these medications   alendronate 70 MG tablet Commonly known as:  FOSAMAX Take 70 mg by mouth once a week. Take with a full glass of water on an empty stomach.   apixaban 5 MG Tabs tablet Commonly known as:  ELIQUIS Take 1 tablet (5 mg total) by mouth 2 (two) times daily.   carvedilol 25 MG tablet Commonly known as:  COREG Take 1 tablet (25 mg total) by mouth 2 (two)  times daily with a meal.   cholecalciferol 1000 units tablet Commonly known as:  VITAMIN D Take 2,000 Units by mouth daily.   colestipol 1 g tablet Commonly known as:  COLESTID Take 1 g by mouth daily.   ferrous sulfate 325 (65 FE) MG tablet Take 325 mg by mouth daily with breakfast.   folic acid 1 MG tablet Commonly known as:  FOLVITE Take 1 mg by mouth daily.   furosemide 40 MG tablet Commonly known as:  LASIX Take 1 tablet (40 mg total) by mouth 2 (two) times daily. What changed:  when to take this   gabapentin 300 MG capsule Commonly known as:  NEURONTIN Take 300 mg by mouth at  bedtime.   HYDROcodone-acetaminophen 10-325 MG tablet Commonly known as:  NORCO Take 1 tablet by mouth every 6 (six) hours as needed for pain.   losartan 25 MG tablet Commonly known as:  COZAAR Take 1 tablet (25 mg total) by mouth daily. Start taking on:  10/13/2017   methotrexate 2.5 MG tablet Commonly known as:  RHEUMATREX Take 15 mg by mouth once a week.   Potassium Chloride ER 20 MEQ Tbcr Take 40 mEq by mouth daily. What changed:    medication strength  how much to take  when to take this   predniSONE 5 MG tablet Commonly known as:  DELTASONE Take 10 mg by mouth daily with breakfast.            Durable Medical Equipment  (From admission, onward)        Start     Ordered   10/12/17 1156  DME Oxygen  Once    Question Answer Comment  Mode or (Route) Nasal cannula   Liters per Minute 2   Frequency Continuous (stationary and portable oxygen unit needed)   Oxygen delivery system Gas      10/12/17 1201     Follow-up Information    Dellinger, [name], MD. Schedule an appointment as soon as possible for a visit in 1 week(s).   Specialty:  Family Medicine Contact information: [address] [place] [address] [phone]        [facility]. Schedule an appointment as soon as possible for a visit.    Contact information: [address] [place] [address] [phone]         Allergies  Allergen Reactions  . Fluoxetine Other (See Comments)    Altered mental status  . Adhesive [Tape] Rash    Cannot tolerate any tape or bandaids more than 24 hrs.  . Demerol [Meperidine Hcl] Rash  . Sulfa Antibiotics Rash    Consultations:  Cardiology  Procedures/Studies: Dg Chest Port 1 View  Result Date: 10/07/2017 CLINICAL DATA:  77 year-old female c/o feeling bloated, SOB, and weakness and decreased appetite x 3 days. EXAM: PORTABLE CHEST 1 VIEW COMPARISON:  None. FINDINGS: The heart size and mediastinal contours are within normal limits. Both lungs are clear. No pleural effusion or pneumothorax. The visualized skeletal structures are intact. IMPRESSION: No active disease. Electronically Signed   By: Amie Portland M.D.   On: 10/07/2017 11:42    2D echo 10/08/2017 Study Conclusions  - Left ventricle: The cavity size was normal. Wall thickness was increased in a pattern of mild LVH. Systolic function was mildly reduced. The estimated ejection fraction was in the range of 45% to 50%. Diffuse hypokinesis. The study is not technically sufficient to allow evaluation of LV diastolic function. Ejection fraction (MOD, 2-plane): 47%. - Aortic valve: Trileaflet. Sclerosis without stenosis. There was no regurgitation. - Mitral valve: Mildly thickened leaflets . There was moderate regurgitation. - Left atrium: The atrium was normal in size. - Inferior vena cava: The vessel was dilated. The respirophasic diameter changes were blunted (<50%), consistent with elevated central venous pressure.  Impressions:  - LVEF 45-50%, mild LVH, global hypokinesis, moderate MR, normal LA size, dilated IVC.   TEE 10/11/2017 Left Ventricle:Moderate LV dysfunction. EF 35%  Mitral Valve:mod- severe MR . PISA radius is 0.8 cm  Aortic  Valve:normal ,  Tricuspid Valve:mod. TR  Pulmonic Valve:trivial PI  Left Atrium/ Left atrial appendage:no thrombi - imaged in 2  views  Atrial septum:no obvious ASD or PFO by color doppler .Marland Kitchen Atrial septum bows left to right  Aorta:mild calcified plaue  Subjective: Breathing "much better." No chest pain, wants to go home. No palpitations.   Discharge Exam: Vitals:   10/12/17 0800 10/12/17 0827  BP: 115/77 133/80  Pulse:  80  Resp:    Temp:  97.9 F (36.6 C)  SpO2:  100%   General: Pt is alert, awake, not in acute distress Cardiovascular: RRR, S1/S2 +, no rubs, no gallops Respiratory: Nonlabored diffusely diminished without wheezes or crackles. Abdominal: Soft, NT, ND, bowel sounds + Extremities: 1+ symmetric LE edema, no cyanosis  Labs: BNP (last 3 results) Recent Labs    10/07/17 1124  BNP 920.6*   Basic Metabolic Panel: Recent Labs  Lab 10/08/17 0609 10/09/17 0946 10/10/17 0719 10/11/17 0545 10/12/17 0401  NA 140 134* 136 138 139  K 3.7 3.8 3.4* 3.6 3.1*  CL 101 95* 96* 94* 94*  CO2 34*  GLUCOSE 130* 140* 121* 136* 129*  BUN CREATININE 1.08* 1.10* 1.08* 1.06* 1.10*  CALCIUM 8.4* 8.6* 8.6* 8.9 8.4*   Liver Function Tests: Recent Labs  Lab 10/07/17 1124 10/08/17 0609  AST 62* 59*  ALT 62* 64*  ALKPHOS 63 52  BILITOT 0.6 0.8  PROT 7.3 6.2*  ALBUMIN 4.1 3.4*   Recent Labs  Lab 10/07/17 1124  LIPASE 30   No results for input(s): AMMONIA in the last 168 hours. CBC: Recent Labs  Lab 10/07/17 1124 10/08/17 0609 10/09/17 0048 10/10/17 0719 10/11/17 0545 10/12/17 0401  WBC 11.2* 10.9* 12.3* 10.5 11.5* 8.3  NEUTROABS 7.7  --   --   --  7.8*  --   HGB 13.8 11.6* 11.8* 11.3* 11.6* 10.5*  HCT 40.8 37.3 35.8* 34.8* 36.0 32.2*  MCV 98.8 101.6* 98.4 98.3 98.6 98.8  PLT 230 204 175 209 248 216   Cardiac Enzymes: Recent Labs  Lab 10/07/17 1124 10/07/17 1835 10/08/17 0331 10/08/17 0609  TROPONINI  0.04* 0.04* 0.05* 0.04*   BNP: Invalid input(s): POCBNP CBG: No results for input(s): GLUCAP in the last 168 hours. D-Dimer No results for input(s): DDIMER in the last 72 hours. Hgb A1c No results for input(s): HGBA1C in the last 72 hours. Lipid Profile No results for input(s): CHOL, HDL, LDLCALC, TRIG, CHOLHDL, LDLDIRECT in the last 72 hours. Thyroid function studies No results for input(s): TSH, T4TOTAL, T3FREE, THYROIDAB in the last 72 hours.  Invalid input(s): FREET3 Anemia work up No results for input(s): VITAMINB12, FOLATE, FERRITIN, TIBC, IRON, RETICCTPCT in the last 72 hours. Urinalysis No results found for: COLORURINE, APPEARANCEUR, LABSPEC, PHURINE, GLUCOSEU, HGBUR, BILIRUBINUR, KETONESUR, PROTEINUR, UROBILINOGEN, NITRITE, LEUKOCYTESUR  Microbiology Recent Results (from the past 240 hour(s))  MRSA PCR Screening     Status: None   Collection Time: 10/09/17  8:09 PM  Result Value Ref Range Status   MRSA by PCR NEGATIVE NEGATIVE Final    Comment:        The GeneXpert MRSA Assay (FDA approved for NASAL specimens only), is one component of a comprehensive MRSA colonization surveillance program. It is not intended to diagnose MRSA infection nor to guide or monitor treatment for MRSA infections. Performed at [facility], [address], [place], [place] [address]     Time coordinating discharge: Approximately 40 minutes  Tyrone Nine, MD  [facility] 10/12/2017, 12:01 PM Pager [phone]

## 2017-10-12 NOTE — Progress Notes (Addendum)
Progress Note  Patient Name: [name] [name] Date of Encounter: 10/12/2017  Primary Cardiologist: No primary care provider on file.   Subjective   Denies any chest pain or shortness of breath  Inpatient Medications    Scheduled Meds: . apixaban  5 mg Oral BID  . calcium carbonate  1 tablet Oral BID WC  . carvedilol  12.5 mg Oral BID WC  . colestipol  1 g Oral Daily  . furosemide  80 mg Intravenous BID  . gabapentin  300 mg Oral QHS  . losartan  25 mg Oral Daily  . potassium chloride  40 mEq Oral Once  . predniSONE  10 mg Oral Q breakfast  . sodium chloride flush  3 mL Intravenous Q12H  . sodium chloride flush  3 mL Intravenous Q12H   Continuous Infusions: . sodium chloride    . sodium chloride     PRN Meds: sodium chloride, acetaminophen **OR** acetaminophen, HYDROcodone-acetaminophen, ipratropium, levalbuterol, metoprolol tartrate, ondansetron **OR** ondansetron (ZOFRAN) IV, sodium chloride flush, sodium chloride flush   Vital Signs    Vitals:   10/12/17 0452 10/12/17 0500 10/12/17 0800 10/12/17 0827  BP: (!) 101/48  115/77 133/80  Pulse: 77   80  Resp: 18     Temp: (!) 97.5 F (36.4 C)   97.9 F (36.6 C)  TempSrc: Oral   Oral  SpO2: 94%   100%  Weight:  290 lb 1.6 oz (131.6 kg)    Height:        Intake/Output Summary (Last 24 hours) at 10/12/2017 0902 Last data filed at 10/12/2017 0115 Gross per 24 hour  Intake 1202.5 ml  Output 1900 ml  Net -697.5 ml   Filed Weights   10/11/17 0500 10/11/17 0725 10/12/17 0500  Weight: 289 lb 14.4 oz (131.5 kg) 289 lb 14.4 oz (131.5 kg) 290 lb 1.6 oz (131.6 kg)    Telemetry    Sinus rhythm with short bursts of PAF with aberration -  personally Reviewed  ECG    No new EKG to review- Personally Reviewed  Physical Exam   GEN: No acute distress.   Neck: No JVD Cardiac: RRR, no murmurs, rubs, or gallops.  Respiratory: Clear to auscultation bilaterally. GI: Soft, nontender, non-distended  MS: No edema; No  deformity. Neuro:  Nonfocal  Psych: Normal affect   Labs    Chemistry Recent Labs  Lab 10/07/17 1124 10/08/17 0609  10/10/17 0719 10/11/17 0545 10/12/17 0401  NA 139 140   < > 136 138 139  K 3.5 3.7   < > 3.4* 3.6 3.1*  CL 103 101   < > 96* 94* 94*  CO2 24 25   < > 30 31 34*  GLUCOSE 159* 130*   < > 121* 136* 129*  BUN 14 11   < > CREATININE 1.00 1.08*   < > 1.08* 1.06* 1.10*  CALCIUM 8.6* 8.4*   < > 8.6* 8.9 8.4*  PROT 7.3 6.2*  --   --   --   --   ALBUMIN 4.1 3.4*  --   --   --   --   AST 62* 59*  --   --   --   --   ALT 62* 64*  --   --   --   --   ALKPHOS 63 52  --   --   --   --   BILITOT 0.6 0.8  --   --   --   --  GFRNONAA 53* 48*   < > 48* 49* 47*  GFRAA >60 55*   < > 55* 57* 54*  ANIONGAP 12 14   < > < > = values in this interval not displayed.     Hematology Recent Labs  Lab 10/10/17 0719 10/11/17 0545 10/12/17 0401  WBC 10.5 11.5* 8.3  RBC 3.54* 3.65* 3.26*  HGB 11.3* 11.6* 10.5*  HCT 34.8* 36.0 32.2*  MCV 98.3 98.6 98.8  MCH 31.9 31.8 32.2  MCHC 32.5 32.2 32.6  RDW 13.7 14.0 13.6  PLT 209 248 216    Cardiac Enzymes Recent Labs  Lab 10/07/17 1124 10/07/17 1835 10/08/17 0331 10/08/17 0609  TROPONINI 0.04* 0.04* 0.05* 0.04*   No results for input(s): TROPIPOC in the last 168 hours.   BNP Recent Labs  Lab 10/07/17 1124  BNP 920.6*     DDimer No results for input(s): DDIMER in the last 168 hours.   Radiology    No results found.  Cardiac Studies   2D echo 10/08/2017 Study Conclusions  - Left ventricle: The cavity size was normal. Wall thickness was increased in a pattern of mild LVH. Systolic function was mildly reduced. The estimated ejection fraction was in the range of 45% to 50%. Diffuse hypokinesis. The study is not technically sufficient to allow evaluation of LV diastolic function. Ejection fraction (MOD, 2-plane): 47%. - Aortic valve: Trileaflet. Sclerosis without stenosis. There  was no regurgitation. - Mitral valve: Mildly thickened leaflets . There was moderate regurgitation. - Left atrium: The atrium was normal in size. - Inferior vena cava: The vessel was dilated. The respirophasic diameter changes were blunted (<50%), consistent with elevated central venous pressure.  Impressions:  - LVEF 45-50%, mild LVH, global hypokinesis, moderate MR, normal LA size, dilated IVC.   TEE 10/11/2017 Left Ventricle:Moderate LV dysfunction. EF 35%  Mitral Valve:mod- severe MR . PISA radius is 0.8 cm  Aortic Valve:normal ,  Tricuspid Valve:mod. TR  Pulmonic Valve:trivial PI  Left Atrium/ Left atrial appendage:no thrombi - imaged in 2 views  Atrial septum:no obvious ASD or PFO by color doppler .Marland Kitchen Atrial septum bows left to right  Aorta:mild calcified plaue  Patient Profile     77 y.o. female with a hx of morbid obesity, HTN, PMR,who is being seen today for the evaluation ofAFRVRat the request of Dr. Sunnie Nielsen  Assessment & Plan    1. Atrial fibrillation with RVR -s/p TEE/DCCV to NSR this admission -She is maintaining normal sinus rhythm although did have a short burst of PAF with aberration since cardioversion last night. -Lopressor changed to Carvedilol 12.5mg  BID due to LV dysfunction -Increase carvedilol to 25 mg twice daily for suppression of arrhythmias -Continue Eliquis 5 mg twice daily  -Cardizem gtt stopped due to LV dysfunction  2. Hypertension -BP remains controlled at 133/80 mmHg on current medical therapy -Cardizem stopped due to LV dysfunction -Continue carvedilol  and losartan 25 mg daily  3. Elevated troponin -Troponin minimally elevated at 0.043 and 0.05 likely related to demand ischemia in the setting of A. fib with RVR -2D echo showed mild LV dysfunction with EF 45 to 50% with diffuse hypokinesis and TEE showed EF 35% likely related to tachycardia induced cardiomyopathy. Also moderate  MR. -Repeat 2D echocardiogram once she is back in normal sinus rhythm for 1 to 2 months.  If LVF remains decreased then will need ischemic workup.  4. Acute systolic CHF -BNP was elevated at 520 -UOPpicked up after increasing  diuretic.   -UOP 1.9L yesterday and is net neg 720 cc -weight is down 2 pounds from admission -Creatinine remains stable at 1.1 -change Lasix to 40 mg p.o. twice daily  5.  Hypokalemia -start  K-Dur 40 mEq p.o. twice daily today and then 40 mEq daily starting tomorrow  Patient is stable from a cardiac standpoint for discharge to home.  We will set her up with early follow-up in our office in 7 to 10 days.  She will have a bmet on Monday.  Will sign off please call with any questions Discharge cardiac medications include:  Apixaban 5 mg twice daily  carvedilol 25 mg twice daily  Lasix 40 mg p.o. twice daily  Cozaar 25 mg daily  K-Dur 40 mEq daily   For questions or updates, please contact [facility] Please consult [email or web address] for contact info under Cardiology/STEMI.      Signed, Armanda Magic, MD  10/12/2017, 9:02 AM

## 2017-10-12 NOTE — Progress Notes (Signed)
SATURATION QUALIFICATIONS: (This note is used to comply with regulatory documentation for home oxygen)  Patient Saturations on Room Air at Rest = 85%  Patient Saturations on Room Air while Ambulating = 78%  Patient Saturations on 2 Liters of oxygen while Ambulating = 92%  Please briefly explain why patient needs home oxygen: MD Grunz aware of home O2 need. Pt states that she uses a scooter at home but that she has been on oxygen during her hospitalization. During ambulation pt had to take several breaks to stop to catch her breath and for her O2 sat to recover.

## 2017-10-17 ENCOUNTER — Emergency Department (HOSPITAL_COMMUNITY): Payer: Medicare Other

## 2017-10-17 ENCOUNTER — Inpatient Hospital Stay (HOSPITAL_COMMUNITY)
Admission: EM | Admit: 2017-10-17 | Discharge: 2017-10-26 | DRG: 308 | Disposition: A | Discharge status: Home Health | Payer: Medicare Other | Attending: Internal Medicine | Admitting: Internal Medicine

## 2017-10-17 ENCOUNTER — Other Ambulatory Visit: Payer: Self-pay

## 2017-10-17 ENCOUNTER — Ambulatory Visit: Payer: Self-pay | Admitting: Cardiology

## 2017-10-17 ENCOUNTER — Encounter (HOSPITAL_COMMUNITY): Payer: Self-pay | Admitting: Family Medicine

## 2017-10-17 DIAGNOSIS — Z7952 Long term (current) use of systemic steroids: Secondary | ICD-10-CM

## 2017-10-17 DIAGNOSIS — Z885 Allergy status to narcotic agent status: Secondary | ICD-10-CM | POA: Diagnosis not present

## 2017-10-17 DIAGNOSIS — N390 Urinary tract infection, site not specified: Secondary | ICD-10-CM | POA: Diagnosis present

## 2017-10-17 DIAGNOSIS — R0602 Shortness of breath: Secondary | ICD-10-CM | POA: Diagnosis present

## 2017-10-17 DIAGNOSIS — E876 Hypokalemia: Secondary | ICD-10-CM | POA: Diagnosis not present

## 2017-10-17 DIAGNOSIS — Z6841 Body Mass Index (BMI) 40.0 and over, adult: Secondary | ICD-10-CM

## 2017-10-17 DIAGNOSIS — B952 Enterococcus as the cause of diseases classified elsewhere: Secondary | ICD-10-CM | POA: Diagnosis present

## 2017-10-17 DIAGNOSIS — I480 Paroxysmal atrial fibrillation: Secondary | ICD-10-CM | POA: Diagnosis present

## 2017-10-17 DIAGNOSIS — J449 Chronic obstructive pulmonary disease, unspecified: Secondary | ICD-10-CM | POA: Diagnosis present

## 2017-10-17 DIAGNOSIS — I340 Nonrheumatic mitral (valve) insufficiency: Secondary | ICD-10-CM | POA: Diagnosis present

## 2017-10-17 DIAGNOSIS — I481 Persistent atrial fibrillation: Secondary | ICD-10-CM | POA: Diagnosis present

## 2017-10-17 DIAGNOSIS — D638 Anemia in other chronic diseases classified elsewhere: Secondary | ICD-10-CM | POA: Diagnosis present

## 2017-10-17 DIAGNOSIS — I4891 Unspecified atrial fibrillation: Secondary | ICD-10-CM

## 2017-10-17 DIAGNOSIS — I5022 Chronic systolic (congestive) heart failure: Secondary | ICD-10-CM | POA: Diagnosis present

## 2017-10-17 DIAGNOSIS — G8929 Other chronic pain: Secondary | ICD-10-CM | POA: Diagnosis present

## 2017-10-17 DIAGNOSIS — Z888 Allergy status to other drugs, medicaments and biological substances status: Secondary | ICD-10-CM | POA: Diagnosis not present

## 2017-10-17 DIAGNOSIS — I420 Dilated cardiomyopathy: Secondary | ICD-10-CM | POA: Diagnosis present

## 2017-10-17 DIAGNOSIS — Z91048 Other nonmedicinal substance allergy status: Secondary | ICD-10-CM

## 2017-10-17 DIAGNOSIS — Z7983 Long term (current) use of bisphosphonates: Secondary | ICD-10-CM | POA: Diagnosis not present

## 2017-10-17 DIAGNOSIS — Z87891 Personal history of nicotine dependence: Secondary | ICD-10-CM | POA: Diagnosis not present

## 2017-10-17 DIAGNOSIS — I5043 Acute on chronic combined systolic (congestive) and diastolic (congestive) heart failure: Secondary | ICD-10-CM | POA: Diagnosis present

## 2017-10-17 DIAGNOSIS — M353 Polymyalgia rheumatica: Secondary | ICD-10-CM | POA: Diagnosis present

## 2017-10-17 DIAGNOSIS — Z7901 Long term (current) use of anticoagulants: Secondary | ICD-10-CM

## 2017-10-17 DIAGNOSIS — Z9981 Dependence on supplemental oxygen: Secondary | ICD-10-CM | POA: Diagnosis not present

## 2017-10-17 DIAGNOSIS — Z882 Allergy status to sulfonamides status: Secondary | ICD-10-CM

## 2017-10-17 DIAGNOSIS — I5042 Chronic combined systolic (congestive) and diastolic (congestive) heart failure: Secondary | ICD-10-CM

## 2017-10-17 DIAGNOSIS — I110 Hypertensive heart disease with heart failure: Secondary | ICD-10-CM | POA: Diagnosis present

## 2017-10-17 DIAGNOSIS — Z9049 Acquired absence of other specified parts of digestive tract: Secondary | ICD-10-CM | POA: Diagnosis not present

## 2017-10-17 DIAGNOSIS — Z90710 Acquired absence of both cervix and uterus: Secondary | ICD-10-CM

## 2017-10-17 DIAGNOSIS — G629 Polyneuropathy, unspecified: Secondary | ICD-10-CM | POA: Diagnosis present

## 2017-10-17 DIAGNOSIS — I509 Heart failure, unspecified: Secondary | ICD-10-CM

## 2017-10-17 DIAGNOSIS — M064 Inflammatory polyarthropathy: Secondary | ICD-10-CM | POA: Diagnosis present

## 2017-10-17 HISTORY — DX: Dependence on supplemental oxygen: Z99.81

## 2017-10-17 HISTORY — DX: Cystocele, unspecified: N81.10

## 2017-10-17 HISTORY — DX: Urinary tract infection, site not specified: N39.0

## 2017-10-17 HISTORY — DX: Polyneuropathy, unspecified: G62.9

## 2017-10-17 LAB — BASIC METABOLIC PANEL
Anion gap: 11 (ref 5–15)
BUN: 17 mg/dL (ref 6–20)
CO2: 29 mmol/L (ref 22–32)
Calcium: 8.9 mg/dL (ref 8.9–10.3)
Chloride: 97 mmol/L — ABNORMAL LOW (ref 101–111)
Creatinine, Ser: 1.09 mg/dL — ABNORMAL HIGH (ref 0.44–1.00)
GFR calc Af Amer: 55 mL/min — ABNORMAL LOW (ref >60)
GFR, EST NON AFRICAN AMERICAN: 47 mL/min — AB (ref >60)
GLUCOSE: 126 mg/dL — AB (ref 65–99)
Potassium: 3.7 mmol/L (ref 3.5–5.1)
Sodium: 137 mmol/L (ref 135–145)

## 2017-10-17 LAB — URINALYSIS, ROUTINE W REFLEX MICROSCOPIC
Bilirubin Urine: NEGATIVE
GLUCOSE, UA: NEGATIVE mg/dL
HGB URINE DIPSTICK: NEGATIVE
Ketones, ur: NEGATIVE mg/dL
NITRITE: NEGATIVE
PH: 7 (ref 5.0–8.0)
Protein, ur: NEGATIVE mg/dL
Specific Gravity, Urine: 1.005 (ref 1.005–1.030)

## 2017-10-17 LAB — CBC
HCT: 36.6 % (ref 36.0–46.0)
HEMOGLOBIN: 11.8 g/dL — AB (ref 12.0–15.0)
MCH: 31.7 pg (ref 26.0–34.0)
MCHC: 32.2 g/dL (ref 30.0–36.0)
MCV: 98.4 fL (ref 78.0–100.0)
Platelets: 274 10*3/uL (ref 150–400)
RBC: 3.72 MIL/uL — ABNORMAL LOW (ref 3.87–5.11)
RDW: 14 % (ref 11.5–15.5)
WBC: 8.5 10*3/uL (ref 4.0–10.5)

## 2017-10-17 LAB — I-STAT TROPONIN, ED: TROPONIN I, POC: 0.02 ng/mL (ref 0.00–0.08)

## 2017-10-17 LAB — BRAIN NATRIURETIC PEPTIDE: B Natriuretic Peptide: 606.4 pg/mL — ABNORMAL HIGH (ref 0.0–100.0)

## 2017-10-17 LAB — MAGNESIUM: Magnesium: 2 mg/dL (ref 1.7–2.4)

## 2017-10-17 MED ORDER — AMIODARONE HCL IN DEXTROSE 360-4.14 MG/200ML-% IV SOLN
30.0000 mg/h | INTRAVENOUS | Status: DC
  Administered 2017-10-17 – 2017-10-21 (×7): 30 mg/h via INTRAVENOUS
  Filled 2017-10-17 (×8): qty 200

## 2017-10-17 MED ORDER — AMIODARONE HCL IN DEXTROSE 360-4.14 MG/200ML-% IV SOLN
60.0000 mg/h | INTRAVENOUS | Status: AC
  Administered 2017-10-17: 60 mg/h via INTRAVENOUS

## 2017-10-17 MED ORDER — VITAMIN D 1000 UNITS PO TABS
2000.0000 [IU] | ORAL_TABLET | Freq: Every day | ORAL | Status: DC
  Administered 2017-10-17 – 2017-10-26 (×10): 2000 [IU] via ORAL
  Filled 2017-10-17 (×12): qty 2

## 2017-10-17 MED ORDER — METOPROLOL TARTRATE 5 MG/5ML IV SOLN
5.0000 mg | Freq: Once | INTRAVENOUS | Status: AC
  Administered 2017-10-17: 5 mg via INTRAVENOUS
  Filled 2017-10-17: qty 5

## 2017-10-17 MED ORDER — ONDANSETRON HCL 4 MG PO TABS: 4.0000 mg | ORAL_TABLET | Freq: Four times a day (QID) | ORAL | Status: DC | PRN

## 2017-10-17 MED ORDER — SODIUM CHLORIDE 0.9 % IV SOLN: 250.0000 mL | INTRAVENOUS | Status: DC | PRN

## 2017-10-17 MED ORDER — ORAL CARE MOUTH RINSE
15.0000 mL | Freq: Two times a day (BID) | OROMUCOSAL | Status: DC
  Administered 2017-10-17 – 2017-10-26 (×13): 15 mL via OROMUCOSAL

## 2017-10-17 MED ORDER — HYDROCODONE-ACETAMINOPHEN 10-325 MG PO TABS
1.0000 | ORAL_TABLET | Freq: Four times a day (QID) | ORAL | Status: DC | PRN
  Administered 2017-10-17 – 2017-10-26 (×31): 1 via ORAL
  Filled 2017-10-17 (×32): qty 1

## 2017-10-17 MED ORDER — SENNOSIDES-DOCUSATE SODIUM 8.6-50 MG PO TABS: 1.0000 | ORAL_TABLET | Freq: Every evening | ORAL | Status: DC | PRN

## 2017-10-17 MED ORDER — BISACODYL 5 MG PO TBEC: 5.0000 mg | DELAYED_RELEASE_TABLET | Freq: Every day | ORAL | Status: DC | PRN

## 2017-10-17 MED ORDER — PREDNISONE 10 MG PO TABS
10.0000 mg | ORAL_TABLET | Freq: Every day | ORAL | Status: DC
  Administered 2017-10-17 – 2017-10-26 (×10): 10 mg via ORAL
  Filled 2017-10-17 (×10): qty 1

## 2017-10-17 MED ORDER — COLESTIPOL HCL 1 G PO TABS
1.0000 g | ORAL_TABLET | Freq: Every day | ORAL | Status: DC
Start: 2017-10-17 — End: 2017-10-26
  Administered 2017-10-17 – 2017-10-26 (×10): 1 g via ORAL
  Filled 2017-10-17 (×10): qty 1

## 2017-10-17 MED ORDER — LOSARTAN POTASSIUM 25 MG PO TABS
25.0000 mg | ORAL_TABLET | Freq: Every day | ORAL | Status: DC
  Administered 2017-10-17 – 2017-10-18 (×2): 25 mg via ORAL
  Filled 2017-10-17 (×2): qty 1

## 2017-10-17 MED ORDER — METOPROLOL TARTRATE 5 MG/5ML IV SOLN: 5.0000 mg | Freq: Once | INTRAVENOUS | Status: DC

## 2017-10-17 MED ORDER — ACETAMINOPHEN 650 MG RE SUPP: 650.0000 mg | Freq: Four times a day (QID) | RECTAL | Status: DC | PRN

## 2017-10-17 MED ORDER — AMIODARONE HCL IN DEXTROSE 360-4.14 MG/200ML-% IV SOLN
60.0000 mg/h | Freq: Once | INTRAVENOUS | Status: AC
  Administered 2017-10-17: 60 mg/h via INTRAVENOUS
  Filled 2017-10-17: qty 200

## 2017-10-17 MED ORDER — CARVEDILOL 25 MG PO TABS
37.5000 mg | ORAL_TABLET | Freq: Two times a day (BID) | ORAL | Status: DC
  Administered 2017-10-17 – 2017-10-18 (×2): 37.5 mg via ORAL
  Filled 2017-10-17 (×2): qty 1

## 2017-10-17 MED ORDER — POTASSIUM CHLORIDE CRYS ER 20 MEQ PO TBCR
40.0000 meq | EXTENDED_RELEASE_TABLET | Freq: Every day | ORAL | Status: DC
  Administered 2017-10-17 – 2017-10-20 (×4): 40 meq via ORAL
  Filled 2017-10-17 (×5): qty 2

## 2017-10-17 MED ORDER — DILTIAZEM LOAD VIA INFUSION
10.0000 mg | Freq: Once | INTRAVENOUS | Status: AC
  Administered 2017-10-17: 10 mg via INTRAVENOUS
  Filled 2017-10-17: qty 10

## 2017-10-17 MED ORDER — APIXABAN 5 MG PO TABS
5.0000 mg | ORAL_TABLET | Freq: Two times a day (BID) | ORAL | Status: DC
  Administered 2017-10-17 – 2017-10-26 (×19): 5 mg via ORAL
  Filled 2017-10-17 (×19): qty 1

## 2017-10-17 MED ORDER — GABAPENTIN 300 MG PO CAPS
300.0000 mg | ORAL_CAPSULE | Freq: Every day | ORAL | Status: DC
Start: 2017-10-17 — End: 2017-10-26
  Administered 2017-10-17 – 2017-10-25 (×9): 300 mg via ORAL
  Filled 2017-10-17 (×9): qty 1

## 2017-10-17 MED ORDER — DILTIAZEM HCL-DEXTROSE 100-5 MG/100ML-% IV SOLN (PREMIX)
5.0000 mg/h | INTRAVENOUS | Status: DC
  Administered 2017-10-17: 5 mg/h via INTRAVENOUS
  Filled 2017-10-17: qty 100

## 2017-10-17 MED ORDER — SODIUM CHLORIDE 0.9 % IV SOLN
1.0000 g | INTRAVENOUS | Status: DC
  Administered 2017-10-17 – 2017-10-18 (×2): 1 g via INTRAVENOUS
  Filled 2017-10-17 (×3): qty 10

## 2017-10-17 MED ORDER — SODIUM CHLORIDE 0.9% FLUSH
3.0000 mL | Freq: Two times a day (BID) | INTRAVENOUS | Status: DC
  Administered 2017-10-18 – 2017-10-26 (×13): 3 mL via INTRAVENOUS

## 2017-10-17 MED ORDER — ONDANSETRON HCL 4 MG/2ML IJ SOLN
4.0000 mg | Freq: Four times a day (QID) | INTRAMUSCULAR | Status: DC | PRN
  Administered 2017-10-21 (×2): 4 mg via INTRAVENOUS
  Filled 2017-10-17 (×2): qty 2

## 2017-10-17 MED ORDER — FUROSEMIDE 10 MG/ML IJ SOLN
40.0000 mg | Freq: Two times a day (BID) | INTRAMUSCULAR | Status: DC
  Administered 2017-10-17 – 2017-10-19 (×5): 40 mg via INTRAVENOUS
  Filled 2017-10-17 (×5): qty 4

## 2017-10-17 MED ORDER — SODIUM CHLORIDE 0.9% FLUSH
3.0000 mL | Freq: Two times a day (BID) | INTRAVENOUS | Status: DC
  Administered 2017-10-17 – 2017-10-19 (×5): 3 mL via INTRAVENOUS

## 2017-10-17 MED ORDER — CARVEDILOL 25 MG PO TABS
25.0000 mg | ORAL_TABLET | Freq: Two times a day (BID) | ORAL | Status: DC
Start: 2017-10-17 — End: 2017-10-17
  Administered 2017-10-17: 25 mg via ORAL
  Filled 2017-10-17: qty 1

## 2017-10-17 MED ORDER — ACETAMINOPHEN 325 MG PO TABS: 650.0000 mg | ORAL_TABLET | Freq: Four times a day (QID) | ORAL | Status: DC | PRN

## 2017-10-17 MED ORDER — FUROSEMIDE 20 MG PO TABS
40.0000 mg | ORAL_TABLET | Freq: Two times a day (BID) | ORAL | Status: DC
  Filled 2017-10-17: qty 2

## 2017-10-17 MED ORDER — SODIUM CHLORIDE 0.9% FLUSH: 3.0000 mL | INTRAVENOUS | Status: DC | PRN

## 2017-10-17 MED ORDER — FOLIC ACID 1 MG PO TABS
1.0000 mg | ORAL_TABLET | Freq: Every day | ORAL | Status: DC
  Administered 2017-10-17 – 2017-10-26 (×10): 1 mg via ORAL
  Filled 2017-10-17 (×11): qty 1

## 2017-10-17 NOTE — Consult Note (Signed)
History & Physical    Patient ID: [name] [name] MRN: [id number], DOB/AGE: 10-05-1940   Admit date: 10/17/2017   Primary Physician: [name] [name]., MD  Patient Profile    77 year old woman with A. fib with RVR  Past Medical History    Past Medical History:  Diagnosis Date  . Arthritis   . Complication of anesthesia   . Hypertension   . Neuropathy   . Polymyalgia (HCC)   . PONV (postoperative nausea and vomiting)     Past Surgical History:  Procedure Laterality Date  . ABDOMINAL HYSTERECTOMY    . CARDIOVERSION N/A 10/11/2017   Procedure: CARDIOVERSION;  Surgeon: [name] Hashimoto Deloris Ping, MD;  Location: [facility] ENDOSCOPY;  Service: Cardiovascular;  Laterality: N/A;  . CHOLECYSTECTOMY    . KNEE ARTHROSCOPY    . TEE WITHOUT CARDIOVERSION N/A 10/11/2017   Procedure: TRANSESOPHAGEAL ECHOCARDIOGRAM (TEE);  Surgeon: [name] Hashimoto Deloris Ping, MD;  Location: [facility] ENDOSCOPY;  Service: Cardiovascular;  Laterality: N/A;     Allergies  Allergies  Allergen Reactions  . Fluoxetine Other (See Comments)    Altered mental status  . Adhesive [Tape] Rash    Cannot tolerate any tape or bandaids more than 24 hrs.  . Demerol [Meperidine Hcl] Rash  . Sulfa Antibiotics Rash    History of Present Illness    77 year old woman with a past medical history of COPD, paroxysmal afib.  She presents with acute onset of shortness of breath and noted herself to have recurrence of A. fib.  She has been admitted a week ago for the same symptoms and was found to be in A. fib which was her first recurrence from 4 to 5 years prior.  She was to be cardioverted and started on apixaban.  During the initial echo while in A. fib she was noted to have an LVEF of 45%.  No echo was done post cardioversion.  She has been on carvedilol and losartan.  No recent changes in medications otherwise.  No recent stressors. Home Medications    Prior to Admission medications   Medication Sig Start Date End Date Taking? Authorizing  Provider  alendronate (FOSAMAX) 70 MG tablet Take 70 mg by mouth once a week. Take with a full glass of water on an empty stomach.   Yes [provider]  apixaban (ELIQUIS) 5 MG TABS tablet Take 1 tablet (5 mg total) by mouth 2 (two) times daily. 10/12/17  Yes [name] Nine, MD  carvedilol (COREG) 25 MG tablet Take 1 tablet (25 mg total) by mouth 2 (two) times daily with a meal. 10/12/17  Yes [name] Nine, MD  cholecalciferol (VITAMIN D) 1000 units tablet Take 2,000 Units by mouth daily.   Yes [provider]  colestipol (COLESTID) 1 g tablet Take 1 g by mouth daily.  10/02/17  Yes [provider]  ferrous sulfate 325 (65 FE) MG tablet Take 325 mg by mouth daily with breakfast.   Yes [provider]  folic acid (FOLVITE) 1 MG tablet Take 1 mg by mouth daily. 09/19/17  Yes [provider]  furosemide (LASIX) 40 MG tablet Take 1 tablet (40 mg total) by mouth 2 (two) times daily. 10/12/17  Yes [name] Nine, MD  gabapentin (NEURONTIN) 300 MG capsule Take 300 mg by mouth at bedtime. 08/22/17  Yes [provider]  HYDROcodone-acetaminophen (NORCO) 10-325 MG tablet Take 1 tablet by mouth every 6 (six) hours as needed for pain. 09/29/17  Yes [provider]  losartan (COZAAR)  25 MG tablet Take 1 tablet (25 mg total) by mouth daily. 10/13/17  Yes [name] Nine, MD  methotrexate (RHEUMATREX) 2.5 MG tablet Take 15 mg by mouth once a week. 09/19/17  Yes [provider]  potassium chloride 20 MEQ TBCR Take 40 mEq by mouth daily. 10/12/17  Yes [name] Nine, MD  predniSONE (DELTASONE) 5 MG tablet Take 10 mg by mouth daily with breakfast.  09/04/17  Yes [provider]    Family History    No family history on file. has no family status information on file.    Social History    Social History   Socioeconomic History  . Marital status: Married    Spouse name: Not on file  . Number of children: Not on file  . Years of education: Not  on file  . Highest education level: Not on file  Occupational History  . Not on file  Social Needs  . Financial resource strain: Not on file  . Food insecurity:    Worry: Not on file    Inability: Not on file  . Transportation needs:    Medical: Not on file    Non-medical: Not on file  Tobacco Use  . Smoking status: Former Games developer  . Smokeless tobacco: Never Used  Substance and Sexual Activity  . Alcohol use: Not on file  . Drug use: Not on file  . Sexual activity: Not on file  Lifestyle  . Physical activity:    Days per week: Not on file    Minutes per session: Not on file  . Stress: Not on file  Relationships  . Social connections:    Talks on phone: Not on file    Gets together: Not on file    Attends religious service: Not on file    Active member of club or organization: Not on file    Attends meetings of clubs or organizations: Not on file    Relationship status: Not on file  . Intimate partner violence:    Fear of current or ex partner: Not on file    Emotionally abused: Not on file    Physically abused: Not on file    Forced sexual activity: Not on file  Other Topics Concern  . Not on file  Social History Narrative  . Not on file     Review of Systems    General:  No chills, fever, night sweats or weight changes.  Cardiovascular:  No chest pain, dyspnea on exertion, edema, orthopnea, palpitations, paroxysmal nocturnal dyspnea. Dermatological: No rash, lesions/masses Respiratory: No cough, dyspnea Urologic: No hematuria, dysuria Abdominal:   No nausea, vomiting, diarrhea, bright red blood per rectum, melena, or hematemesis Neurologic:  No visual changes, wkns, changes in mental status. All other systems reviewed and are otherwise negative except as noted above.  Physical Exam    Blood pressure 94/66, pulse (!) 57, temperature (!) 97.4 F (36.3 C), temperature source Oral, resp. rate (!) 21, height 5\' 4"  (1.626 m), weight 131.5 kg (290 lb), SpO2 97 %.    General: Pleasant, NAD Psych: Normal affect. Neuro: Alert and oriented X 3. Moves all extremities spontaneously. HEENT: Normal  Neck: Supple without bruits or JVD. Lungs:  Resp regular and unlabored, CTA. Heart: RRR no s3, s4, or murmurs. Abdomen: Soft, non-tender, non-distended, BS + x 4.  Extremities: No clubbing, cyanosis or edema. DP/PT/Radials 2+ and equal bilaterally.  Labs    Troponin [facility] of Care Test) Recent Labs  10/17/17 0257  TROPIPOC 0.02   No results for input(s): CKTOTAL, CKMB, TROPONINI in the last 72 hours. Lab Results  Component Value Date   WBC 8.5 10/17/2017   HGB 11.8 (L) 10/17/2017   HCT 36.6 10/17/2017   MCV 98.4 10/17/2017   PLT 274 10/17/2017    Recent Labs  Lab 10/17/17 0251  NA 137  K 3.7  CL 97*  CO2 29  BUN 17  CREATININE 1.09*  CALCIUM 8.9  GLUCOSE 126*   No results found for: CHOL, HDL, LDLCALC, TRIG No results found for: [facility]   Radiology Studies    Dg Chest Port 1 View  Result Date: 10/17/2017 CLINICAL DATA:  Acute onset of shortness of breath. Atrial fibrillation. EXAM: PORTABLE CHEST 1 VIEW COMPARISON:  Chest radiograph performed 11/17/2015 FINDINGS: The lungs are well-aerated. Minimal bibasilar atelectasis is noted. Mild vascular congestion is seen. There is no evidence of pleural effusion or pneumothorax. The cardiomediastinal silhouette is borderline normal in size. No acute osseous abnormalities are seen. IMPRESSION: Minimal bibasilar atelectasis noted. Mild vascular congestion seen. Electronically Signed   By: Roanna RaiderJeffery  Chang M.D.   On: 10/17/2017 03:02   Dg Chest Port 1 View  Result Date: 10/07/2017 CLINICAL DATA:  77 year-old female c/o feeling bloated, SOB, and weakness and decreased appetite x 3 days. EXAM: PORTABLE CHEST 1 VIEW COMPARISON:  None. FINDINGS: The heart size and mediastinal contours are within normal limits. Both lungs are clear. No pleural effusion or pneumothorax. The visualized skeletal structures are  intact. IMPRESSION: No active disease. Electronically Signed   By: Amie Portlandavid  Ormond M.D.   On: 10/07/2017 11:42    ECG & Cardiac Imaging    A. fib with RVR  Assessment & Plan    A. Fib: paroxysmal, rates in the 140s.  Previously diagnosed with reduced ejection fraction in the setting of prior episode.  Thus I am not sure that this represents a true reduced LVEF.  She had no evaluation of coronary anatomy yet.  It appears that she failed rate control as a single strategy.  I would recommend initiation of amiodarone drip at this time point.  I discussed with her additional rhythm control strategies including atrial fibrillation ablation which could be a potential solution for her in the near future.  Would also recommend to switch from diltiazem to metoprolol pushes given prior evidence for slightly reduced LVEF.  Signed, Macario GoldsMarat Serenity Fortner, MD 10/17/2017, 4:22 AM

## 2017-10-17 NOTE — H&P (Signed)
History and Physical    [name] RocheBetty Conkright WGN:[id number] DOB: 12/08/1940 DOA: 10/17/2017  PCP: Leanora Ivanoffellinger, Robert C. Jr., MD   Patient coming from: Home   Chief Complaint: DOE, palpitations   HPI: [name] [name] is a 77 y.o. female with medical history significant for polymyalgia, hypertension, chronic systolic CHF, and atrial fibrillation on Eliquis, now presenting to the emergency department with exertional dyspnea and palpitations.  Patient was admitted to the hospital late last month with atrial fibrillation and RVR, started on anticoagulation, cardioverted on 10/11/2017, and discharged home in sinus rhythm.  She reports the insidious return of exertional dyspnea and palpitations since her hospital discharge on 10/12/2017.  She denies chest pain, cough, fevers, or chills.  She reports continued adherence with her medications.  ED Course: Upon arrival to the ED, patient is found to be afebrile, saturating adequately on her usual 2 L/min of supplemental oxygen, tachycardic to 140, and with systolic blood pressure in the 90s.  EKG features atrial fibrillation with RVR, rate 139, and PVCs.  Chest x-ray is notable for mild vascular congestion.  Chemistry panel reveals a creatinine 1.09, similar to priors.  CBC is unremarkable, troponin is normal, and BNP is elevated to 606.  Patient was given 10 mg IV diltiazem and 5 mg IV Lopressor in the ED.  Cardiology was consulted by the ED physician, evaluated the patient in the emergency department, and recommends starting amiodarone infusion and having hospitalist admit.  Review of Systems:  All other systems reviewed and apart from HPI, are negative.  Past Medical History:  Diagnosis Date  . Arthritis   . Complication of anesthesia   . Hypertension   . Neuropathy   . Polymyalgia (HCC)   . PONV (postoperative nausea and vomiting)     Past Surgical History:  Procedure Laterality Date  . ABDOMINAL HYSTERECTOMY    . CARDIOVERSION N/A 10/11/2017   Procedure:  CARDIOVERSION;  Surgeon: Elease HashimotoNahser, Deloris PingPhilip J, MD;  [facility];  Service: Cardiovascular;  Laterality: N/A;  . CHOLECYSTECTOMY    . KNEE ARTHROSCOPY    . TEE WITHOUT CARDIOVERSION N/A 10/11/2017   Procedure: TRANSESOPHAGEAL ECHOCARDIOGRAM (TEE);  Surgeon: Elease HashimotoNahser, Deloris PingPhilip J, MD;  [facility];  Service: Cardiovascular;  Laterality: N/A;     reports that she has quit smoking. She has never used smokeless tobacco. Her alcohol and drug histories are not on file.  Allergies  Allergen Reactions  . Fluoxetine Other (See Comments)    Altered mental status  . Adhesive [Tape] Rash    Cannot tolerate any tape or bandaids more than 24 hrs.  . Demerol [Meperidine Hcl] Rash  . Sulfa Antibiotics Rash    History reviewed. No pertinent family history.   Prior to Admission medications   Medication Sig Start Date End Date Taking? Authorizing Provider  alendronate (FOSAMAX) 70 MG tablet Take 70 mg by mouth once a week. Take with a full glass of water on an empty stomach.   Yes [provider]  apixaban (ELIQUIS) 5 MG TABS tablet Take 1 tablet (5 mg total) by mouth 2 (two) times daily. 10/12/17  Yes Tyrone NineGrunz, Ryan B, MD  carvedilol (COREG) 25 MG tablet Take 1 tablet (25 mg total) by mouth 2 (two) times daily with a meal. 10/12/17  Yes Tyrone NineGrunz, Ryan B, MD  cholecalciferol (VITAMIN D) 1000 units tablet Take 2,000 Units by mouth daily.   Yes [provider]  colestipol (COLESTID) 1 g tablet Take 1 g by mouth daily.  10/02/17  Yes [provider]  ferrous sulfate 325 (65 FE) MG tablet Take 325 mg by mouth daily with breakfast.   Yes [provider]  folic acid (FOLVITE) 1 MG tablet Take 1 mg by mouth daily. 09/19/17  Yes [provider]  furosemide (LASIX) 40 MG tablet Take 1 tablet (40 mg total) by mouth 2 (two) times daily. 10/12/17  Yes Tyrone Nine, MD  gabapentin (NEURONTIN) 300 MG capsule Take 300 mg by mouth at bedtime. 08/22/17  Yes [provider]  HYDROcodone-acetaminophen (NORCO) 10-325 MG tablet Take 1 tablet by mouth every 6 (six) hours as needed for pain. 09/29/17  Yes [provider]  losartan (COZAAR) 25 MG tablet Take 1 tablet (25 mg total) by mouth daily. 10/13/17  Yes Tyrone Nine, MD  methotrexate (RHEUMATREX) 2.5 MG tablet Take 15 mg by mouth once a week. 09/19/17  Yes [provider]  potassium chloride 20 MEQ TBCR Take 40 mEq by mouth daily. 10/12/17  Yes Tyrone Nine, MD  predniSONE (DELTASONE) 5 MG tablet Take 10 mg by mouth daily with breakfast.  09/04/17  Yes [provider]    Physical Exam: Vitals:   10/17/17 0345 10/17/17 0400 10/17/17 0415 10/17/17 0500  BP:  109/79 97/74 (!) 110/57  Pulse: (!) 57 79 64   Resp: (!) 21 (!) 23 20 18   Temp:      TempSrc:      SpO2: 97% 96% 97% 98%  Weight:      Height:          Constitutional: NAD, calm, obese Eyes: PERTLA, lids and conjunctivae normal ENMT: Mucous membranes are moist. Posterior pharynx clear of any exudate or lesions.   Neck: normal, supple, no masses, no thyromegaly Respiratory: no wheezing, no rhonchi. Normal respiratory effort. No accessory muscle use.  Cardiovascular: Rate ~120 and irregular. Pretibial pitting edema bilaterally. Abdomen: No distension, no tenderness, soft. Bowel sounds normal.  Musculoskeletal: no clubbing / cyanosis. No joint deformity upper and lower extremities.    Skin: no significant rashes, lesions, ulcers. Warm, dry, well-perfused. Neurologic: No facial asymmetry. Sensation intact. Moving all extremities.  Psychiatric: Alert and oriented to person, place, and situation. Calm and cooperative.     Labs on Admission: I have personally reviewed following labs and imaging studies  CBC: Recent Labs  Lab 10/10/17 0719 10/11/17 0545 10/12/17 0401 10/17/17 0251  WBC 10.5 11.5* 8.3 8.5  NEUTROABS  --  7.8*  --   --   HGB 11.3* 11.6* 10.5* 11.8*  HCT 34.8* 36.0 32.2* 36.6  MCV 98.3 98.6  98.8 98.4  PLT 209 248 216 274   Basic Metabolic Panel: Recent Labs  Lab 10/10/17 0719 10/11/17 0545 10/12/17 0401 10/17/17 0251  NA 136 138 139 137  K 3.4* 3.6 3.1* 3.7  CL 96* 94* 94* 97*  CO2 30 31 34* 29  GLUCOSE 121* 136* 129* 126*  BUN 16 17 17 17   CREATININE 1.08* 1.06* 1.10* 1.09*  CALCIUM 8.6* 8.9 8.4* 8.9  MG  --   --   --  2.0   GFR: Estimated Creatinine Clearance: 57.3 mL/min (A) (by C-G formula based on SCr of 1.09 mg/dL (H)). Liver Function Tests: No results for input(s): AST, ALT, ALKPHOS, BILITOT, PROT, ALBUMIN in the last 168 hours. No results for input(s): LIPASE, AMYLASE in the last 168 hours. No results for input(s): AMMONIA in the last 168 hours. Coagulation Profile: No results for input(s): INR, PROTIME in the last 168 hours. Cardiac  Enzymes: No results for input(s): CKTOTAL, CKMB, CKMBINDEX, TROPONINI in the last 168 hours. BNP (last 3 results) No results for input(s): PROBNP in the last 8760 hours. HbA1C: No results for input(s): HGBA1C in the last 72 hours. CBG: No results for input(s): GLUCAP in the last 168 hours. Lipid Profile: No results for input(s): CHOL, HDL, LDLCALC, TRIG, CHOLHDL, LDLDIRECT in the last 72 hours. Thyroid Function Tests: No results for input(s): TSH, T4TOTAL, FREET4, T3FREE, THYROIDAB in the last 72 hours. Anemia Panel: No results for input(s): VITAMINB12, FOLATE, FERRITIN, TIBC, IRON, RETICCTPCT in the last 72 hours. Urine analysis:    Component Value Date/Time   COLORURINE YELLOW 10/27/2006 1337   APPEARANCEUR CLEAR 10/27/2006 1337   LABSPEC 1.008 10/27/2006 1337   PHURINE 6.5 10/27/2006 1337   GLUCOSEU NEGATIVE 10/27/2006 1337   HGBUR TRACE 10/27/2006 1337   BILIRUBINUR NEGATIVE 10/27/2006 1337   KETONESUR NEGATIVE 10/27/2006 1337   PROTEINUR NEGATIVE 10/27/2006 1337   UROBILINOGEN 0.2 10/27/2006 1337   NITRITE NEGATIVE 10/27/2006 1337   LEUKOCYTESUR SMALL 10/27/2006 1337   Sepsis  Labs: @LABRCNTIP (procalcitonin:4,lacticidven:4) ) Recent Results (from the past 240 hour(s))  MRSA PCR Screening     Status: None   Collection Time: 10/09/17  8:09 PM  Result Value Ref Range Status   MRSA by PCR NEGATIVE NEGATIVE Final    Comment:        The GeneXpert MRSA Assay (FDA approved for NASAL specimens only), is one component of a comprehensive MRSA colonization surveillance program. It is not intended to diagnose MRSA infection nor to guide or monitor treatment for MRSA infections. Performed at [facility], [address] [place], [place] [address]      Radiological Exams on Admission: Dg Chest Port 1 View  Result Date: 10/17/2017 CLINICAL DATA:  Acute onset of shortness of breath. Atrial fibrillation. EXAM: PORTABLE CHEST 1 VIEW COMPARISON:  Chest radiograph performed 11/17/2015 FINDINGS: The lungs are well-aerated. Minimal bibasilar atelectasis is noted. Mild vascular congestion is seen. There is no evidence of pleural effusion or pneumothorax. The cardiomediastinal silhouette is borderline normal in size. No acute osseous abnormalities are seen. IMPRESSION: Minimal bibasilar atelectasis noted. Mild vascular congestion seen. Electronically Signed   By: Roanna Raider M.D.   On: 10/17/2017 03:02    EKG: Independently reviewed. Atrial fibrillation with RVR (rate 139), PVC's.   Assessment/Plan   1. Atrial fibrillation with RVR  - Presents with DOE and palpitations  - Found to be in atrial fibrillation with rate 130's  - CHADS-VASc at least 9 (age x2, gender, CHF)  - She had been cardioverted on 5/29 and started on Eliquis  - Cardiology consulting and much appreciated, recommending amiodarone infusion and prn Lopressor IVP's  - Continue amiodarone infusion, continue Eliquis, use Lopressor IVP's prn    2. Chronic systolic CHF  - Peripheral edema, elevated BNP, and mild vascular congestion on CXR are noted  - She is not in any respiratory distress and BP is  low-normal in ED, so hesitant to give IV Lasix  - Continue her usual dose oral Lasix and Coreg as BP allows, follow daily wt and I/O's    3. Polymyalgia; chronic pain  - No pain complaints on admission  - She is managed with methotrexate, prednisone, and prn Norco, will continue     DVT prophylaxis: Eliquis  Code Status: Full  Family Communication: Husband and son updated at bedside Consults called: Cardiology  Admission status: Inpatient    Briscoe Deutscher, MD [facility] Pager [phone]  If 7PM-7AM, please contact night-coverage [email or web address] Password [email or web address]  10/17/2017, 5:04 AM

## 2017-10-17 NOTE — Progress Notes (Addendum)
Progress Note  Patient Name: [name] RocheBetty [name] Date of Encounter: 10/17/2017  Primary Cardiologist: Dr. Josiah Loboevenkar  Subjective   Patient denies chest pain.  Continues with mild palpitations secondary to A fib RVR.  Inpatient Medications    Scheduled Meds: . apixaban  5 mg Oral BID  . carvedilol  25 mg Oral BID WC  . cholecalciferol  2,000 Units Oral Daily  . colestipol  1 g Oral Daily  . folic acid  1 mg Oral Daily  . furosemide  40 mg Oral BID  . gabapentin  300 mg Oral QHS  . losartan  25 mg Oral Daily  . potassium chloride SA  40 mEq Oral Daily  . predniSONE  10 mg Oral Q breakfast  . sodium chloride flush  3 mL Intravenous Q12H  . sodium chloride flush  3 mL Intravenous Q12H   Continuous Infusions: . sodium chloride    . amiodarone    . amiodarone     PRN Meds: sodium chloride, acetaminophen **OR** acetaminophen, bisacodyl, HYDROcodone-acetaminophen, ondansetron **OR** ondansetron (ZOFRAN) IV, senna-docusate, sodium chloride flush   Vital Signs    Vitals:   10/17/17 0500 10/17/17 0530 10/17/17 0615 10/17/17 0645  BP: (!) 110/57 106/79 117/76 105/85  Pulse:  98 87 (!) 151  Resp: 18 (!) 22 (!) 21 (!) 23  Temp:      TempSrc:      SpO2: 98% 97% 98% 99%  Weight:      Height:       No intake or output data in the 24 hours ending 10/17/17 0708 Filed Weights   10/17/17 0235  Weight: 290 lb (131.5 kg)   Physical Exam   General: Obese,  NAD Skin: Warm, dry, intact  Head: Normocephalic, atraumatic, clear, moist mucus membranes. Neck: Negative for carotid bruits. No JVD Lungs: Bilateral LL crackles. No wheezes. Breathing is unlabored. Cardiovascular: Irregularly irregular with S1 S2. No murmurs, rubs, gallops Abdomen: Soft, non-tender, non-distended with normoactive bowel sounds.  No obvious abdominal masses. MSK: Strength and tone appear normal for age. 5/5 in all extremities Extremities 2+ BLE edema. No clubbing or cyanosis. DP/PT pulses 1 +  bilaterally Neuro: Alert and oriented. No focal deficits. No facial asymmetry. MAE spontaneously. Psych: Responds to questions appropriately with normal affect.    Labs    Chemistry Recent Labs  Lab 10/11/17 0545 10/12/17 0401 10/17/17 0251  NA 138 139 137  K 3.6 3.1* 3.7  CL 94* 94* 97*  CO2 31 34* 29  GLUCOSE 136* 129* 126*  BUN 17 17 17   CREATININE 1.06* 1.10* 1.09*  CALCIUM 8.9 8.4* 8.9  GFRNONAA 49* 47* 47*  GFRAA 57* 54* 55*  ANIONGAP 13 11 11     Hematology Recent Labs  Lab 10/11/17 0545 10/12/17 0401 10/17/17 0251  WBC 11.5* 8.3 8.5  RBC 3.65* 3.26* 3.72*  HGB 11.6* 10.5* 11.8*  HCT 36.0 32.2* 36.6  MCV 98.6 98.8 98.4  MCH 31.8 32.2 31.7  MCHC 32.2 32.6 32.2  RDW 14.0 13.6 14.0  PLT 248 216 274   Cardiac EnzymesNo results for input(s): TROPONINI in the last 168 hours.  Recent Labs  Lab 10/17/17 0257  TROPIPOC 0.02    BNP Recent Labs  Lab 10/17/17 0251  BNP 606.4*    DDimer No results for input(s): DDIMER in the last 168 hours.   Radiology    Dg Chest Port 1 View  Result Date: 10/17/2017 CLINICAL DATA:  Acute onset of shortness of breath. Atrial fibrillation.  EXAM: PORTABLE CHEST 1 VIEW COMPARISON:  Chest radiograph performed 11/17/2015 FINDINGS: The lungs are well-aerated. Minimal bibasilar atelectasis is noted. Mild vascular congestion is seen. There is no evidence of pleural effusion or pneumothorax. The cardiomediastinal silhouette is borderline normal in size. No acute osseous abnormalities are seen. IMPRESSION: Minimal bibasilar atelectasis noted. Mild vascular congestion seen. Electronically Signed   By: Roanna Raider M.D.   On: 10/17/2017 03:02   Telemetry    Atrial fibrillation HR 124- Personally Reviewed  ECG    10/17/17: Atrial fibrillation with RVR HR 139 with PVC - Personally Reviewed  Cardiac Studies   ECHO: TEE cardioversion 10/11/2017: Study Conclusions  - Left ventricle: No evidence of thrombus. - Mitral valve: There  was moderate to severe regurgitation. - Left atrium: No evidence of thrombus in the atrial cavity or   appendage. - Tricuspid valve: There was mild-moderate regurgitation.  Impressions:  - Successful cardioversion. No cardiac source of emboli was   indentified.  Echocardiogram 10/08/2017: Study Conclusions  - Left ventricle: The cavity size was normal. Wall thickness was   increased in a pattern of mild LVH. Systolic function was mildly   reduced. The estimated ejection fraction was in the range of 45%   to 50%. Diffuse hypokinesis. The study is not technically   sufficient to allow evaluation of LV diastolic function. Ejection   fraction (MOD, 2-plane): 47%. - Aortic valve: Trileaflet. Sclerosis without stenosis. There was   no regurgitation. - Mitral valve: Mildly thickened leaflets . There was moderate   regurgitation. - Left atrium: The atrium was normal in size. - Inferior vena cava: The vessel was dilated. The respirophasic   diameter changes were blunted (< 50%), consistent with elevated   central venous pressure.  Impressions:  - LVEF 45-50%, mild LVH, global hypokinesis, moderate MR, normal LA   size, dilated IVC.  Cardiac catheterization: None   Patient Profile     77 y.o. female with a hx of HTN, chronic systolic heart failure, recent recurrence atrial fibrillation on Eliquis (intial dx 4-5 years ago) and polymyalgia who is being seen by Cardiology for the evaluation of atrial fibrillation.   Assessment & Plan    1. Atrial fibrillation with RVR: -Pt presented to [facility] 10/17/2017 with c/o dyspnea on exertion and palpitations. She was recently admitted to the hospital one week ago for atrial fibrillation with RVR (first recurrence since initial diagnosis 4 to 5 years ago), started on Eliquis and cardioverted on 10/11/2017 to normal sinus rhythm. Echocardiogram at that time revealed an LVEF of 45%, mild LVH, global hypokinesis, moderate MR, normal LA size, dilated  IVC.  She has not had an ischemic work-up and was started on carvedilol and losartan. Patient returns with complaints of dyspnea and palpitations since discharge on 10/12/2017. In the ED she was initially started on diltiazem gtt and IV lopressor>>now transitioned to Amiodarone gtt with IV Lopressor as needed -Will need to stabilize acute systolic heart failure symptoms then consider DCCV given that she has been compliant with Eliquis since initiation  -Continue Eliquis, carvedilol 25 -CHA2DS2VASc = 5 (age x2, sex, CHF, hypertension)  2. Acute on chronic systolic heart failure: -Echocardiogram 10/08/2017 with LVEF 45-50%, mild LVH, global hypokinesis, moderate MR, normal LA size, dilated IVC with clinical signs of acute systolic failure, CXR with minimal bibasilar atelectasis and mild vascular congestion with a BNP of 606 on admission -Continue carvedilol 25, losartan 25 -Will give IV Lasix 40 mg twice daily given her acute CHF symptoms with K  supplementation -Weight, 290lb today, prior to admission weights 292lb and 289lb -I&O, none recorded so far -Consider ischemic work-up once more stable secondary to global kinesis per echocardiogram and worsening acute CHF symptoms versus atrial fibrillation with RVR  3.  HTN: -Stable, 105/85, 117/76, 106/79 -Continue carvedilol, losartan  4.  Polymyalgia: -Denies pain, on chronic methotrexate, prednisone  Signed, Georgie Chard NP-C [facility] Pager: [phone] 10/17/2017, 7:08 AM     For questions or updates, please contact   Please consult [email or web address] for contact info under Cardiology/STEMI.  Attending Note:   The patient was seen and examined.  Agree with assessment and plan as noted above.  Changes made to the above note as needed.  Patient seen and independently examined with Georgie Chard, NP.   We discussed all aspects of the encounter. I agree with the assessment and plan as stated above.  1.  Atrial fibrillation with rapid  ventricular response: Patient with presents with recurrent atrial fibrillation.  She was cardioverted last week.  She is now on amiodarone drip.  Her heart rate is still rapid.  She is getting amiodarone infusion.  I think we need to wait till she receives at least several grams of amiodarone before we retry cardioversion.  I do not think that it will be successful in the next several days.  2.  Acute on chronic combined systolic and diastolic congestive heart failure: The patient presents with worsening shortness of breath.  It is quite likely that her heart failure has been exacerbated by her rapid atrial fibrillation.  We will work on getting her atrial fibrillation rate better controlled.  Continue Lasix.  We will increase the dose of carvedilol to 37.5 mg twice a day.  Continue losartan and titrate upward as tolerated.    I have spent a total of 40 minutes with patient reviewing hospital  notes , telemetry, EKGs, labs and examining patient as well as establishing an assessment and plan that was discussed with the patient. > 50% of time was spent in direct patient care.    [name], Montez Hageman., MD, [facility] 10/17/2017, 10:02 AM [address]  [address] Office [phone] Pager [phone]

## 2017-10-17 NOTE — ED Triage Notes (Signed)
Pt arrives from home, SOB with any exertion. Hx CHF and Afib with RVR, in Afib on arrival.  Pt is on 2L at home, was placed briefly on non-rebreather to transfer by [facility] but on 2L upon arrival.  Was seen on Thursday, cardioverted out of Afib and d/cd. HR per [facility] 130-170's.  Pt had recent medication change, labetalol to carvedilol and lasix increased to BID with no relief from fluid overload. Bilateral edema in legs on arrival.  [facility] reports rales in bases of lower lungs. 18LAC IV and EMS blood pressure 127/79

## 2017-10-17 NOTE — Discharge Instructions (Addendum)
Information on my medicine - ELIQUIS (apixaban)  Why was Eliquis prescribed for you? Eliquis was prescribed for you to reduce the risk of a blood clot forming that can cause a stroke if you have a medical condition called atrial fibrillation (a type of irregular heartbeat).  What do You need to know about Eliquis ? Take your Eliquis TWICE DAILY - one tablet in the morning and one tablet in the evening with or without food. If you have difficulty swallowing the tablet whole please discuss with your pharmacist how to take the medication safely.  Take Eliquis exactly as prescribed by your doctor and DO NOT stop taking Eliquis without talking to the doctor who prescribed the medication.  Stopping may increase your risk of developing a stroke.  Refill your prescription before you run out.  After discharge, you should have regular check-up appointments with your healthcare provider that is prescribing your Eliquis.  In the future your dose may need to be changed if your kidney function or weight changes by a significant amount or as you get older.  What do you do if you miss a dose? If you miss a dose, take it as soon as you remember on the same day and resume taking twice daily.  Do not take more than one dose of ELIQUIS at the same time to make up a missed dose.  Important Safety Information A possible side effect of Eliquis is bleeding. You should call your healthcare provider right away if you experience any of the following: ? Bleeding from an injury or your nose that does not stop. ? Unusual colored urine (red or dark brown) or unusual colored stools (red or black). ? Unusual bruising for unknown reasons. ? A serious fall or if you hit your head (even if there is no bleeding).  Some medicines may interact with Eliquis and might increase your risk of bleeding or clotting while on Eliquis. To help avoid this, consult your healthcare provider or pharmacist prior to using any new  prescription or non-prescription medications, including herbals, vitamins, non-steroidal anti-inflammatory drugs (NSAIDs) and supplements.  This website has more information on Eliquis (apixaban): http://www.eliquis.com/eliquis/home    Follow with [name] [name], [name] Starcherobert C. Jr., MD in 5-7 days  Please get a complete blood count and chemistry panel checked by your Primary MD at your next visit, and again as instructed by your Primary MD. Please get your medications reviewed and adjusted by your Primary MD.  Please request your Primary MD to go over all Hospital Tests and Procedure/Radiological results at the follow up, please get all Hospital records sent to your Prim MD by signing hospital release before you go home.  If you had Pneumonia of Lung problems at the Hospital: Please get a 2 view Chest X ray done in 6-8 weeks after hospital discharge or sooner if instructed by your Primary MD.  If you have Congestive Heart Failure: Please call your Cardiologist or Primary MD anytime you have any of the following symptoms:  1) 3 pound weight gain in 24 hours or 5 pounds in 1 week  2) shortness of breath, with or without a dry hacking cough  3) swelling in the hands, feet or stomach  4) if you have to sleep on extra pillows at night in order to breathe  Follow cardiac low salt diet and 1.5 lit/day fluid restriction.  If you have diabetes Accuchecks 4 times/day, Once in AM empty stomach and then before each meal. Log in all results and show  them to your primary doctor at your next visit. If any glucose reading is under 80 or above 300 call your primary MD immediately.  If you have Seizure/Convulsions/Epilepsy: Please do not drive, operate heavy machinery, participate in activities at heights or participate in high speed sports until you have seen by Primary MD or a Neurologist and advised to do so again.  If you had Gastrointestinal Bleeding: Please ask your Primary MD to check a complete blood  count within one week of discharge or at your next visit. Your endoscopic/colonoscopic biopsies that are pending at the time of discharge, will also need to followed by your Primary MD.  Get Medicines reviewed and adjusted. Please take all your medications with you for your next visit with your Primary MD  Please request your Primary MD to go over all hospital tests and procedure/radiological results at the follow up, please ask your Primary MD to get all Hospital records sent to his/her office.  If you experience worsening of your admission symptoms, develop shortness of breath, life threatening emergency, suicidal or homicidal thoughts you must seek medical attention immediately by calling 911 or calling your MD immediately  if symptoms less severe.  You must read complete instructions/literature along with all the possible adverse reactions/side effects for all the Medicines you take and that have been prescribed to you. Take any new Medicines after you have completely understood and accpet all the possible adverse reactions/side effects.   Do not drive or operate heavy machinery when taking Pain medications.   Do not take more than prescribed Pain, Sleep and Anxiety Medications  Special Instructions: If you have smoked or chewed Tobacco  in the last 2 yrs please stop smoking, stop any regular Alcohol  and or any Recreational drug use.  Wear Seat belts while driving.  Please note You were cared for by a hospitalist during your hospital stay. If you have any questions about your discharge medications or the care you received while you were in the hospital after you are discharged, you can call the unit and asked to speak with the hospitalist on call if the hospitalist that took care of you is not available. Once you are discharged, your primary care physician will handle any further medical issues. Please note that NO REFILLS for any discharge medications will be authorized once you are  discharged, as it is imperative that you return to your primary care physician (or establish a relationship with a primary care physician if you do not have one) for your aftercare needs so that they can reassess your need for medications and monitor your lab values.  You can reach the hospitalist office at phone [phone] or fax [phone]   If you do not have a primary care physician, you can call [phone] for a physician referral.  Activity: As tolerated with Full fall precautions use walker/cane & assistance as needed  Diet: low salt, heart healthy  Disposition Home

## 2017-10-17 NOTE — ED Notes (Signed)
Assisted patient to bedside commode. Pt states she feels short of breath with exertion but tolerated well. SPO2% stayed above 95. Pt alert and oriented. Other than HR vitals remain stable.

## 2017-10-17 NOTE — ED Notes (Signed)
Pt sitting up on the side of the bed, stating that she just needs to change positions. Pt steady and son at bedside. Pt and family informed to let us know when she is ready to lay down.

## 2017-10-17 NOTE — Progress Notes (Addendum)
Patient was admitted earlier this morning.  H&P reviewed.  Patient seen and examined.  S: Patient states that she is feeling better this morning compared to last night.  Still short of breath.  Denies any chest pain.  No nausea vomiting.  O: Heart rate continues to vary between 1 20-1 50.  Blood pressure is 114/60.  Saturating in the 90s on oxygen by nasal cannula. Lungs reveal crackles bilateral bases.  Normal effort at rest.  No wheezing.  No rhonchi. S1-S2 irregularly irregular.  Tachycardic. Abdomen soft nontender nondistended. Does have pedal edema bilateral lower extremity with chronic venous stasis. No focal neurological deficits  Troponin 0.02.  BNP elevated at 606.  Other labs reviewed.  A/P: Patient was recently in the hospital for atrial fibrillation and was cardioverted.  She was discharged in stable condition.  However has reverted back to atrial fibrillation and now with RVR.  Patient is currently on amiodarone infusion which will be continued.  She is getting carvedilol which will be continued.  She is on Lasix for history of chronic systolic CHF.  To her dyspnea and concern for pulmonary edema this has been changed over to intravenous formulation by cardiology which we agree with.    Discussed with patient and family.  Rest of the plan as per H&P.  We will continue to follow on a daily basis.  Called by RN stating that patient with dysuria for 2 days. UA is equivocal. Will send off urine culture. Since patient is symptomatic, will treat with ceftriaxone.  Osvaldo ShipperGokul Jerremy Maione 10/17/2017

## 2017-10-17 NOTE — ED Notes (Signed)
Admitting at bedside 

## 2017-10-17 NOTE — ED Provider Notes (Signed)
[facility] EMERGENCY DEPARTMENT Provider Note   CSN: [id number] Arrival date & time: 10/17/17  0225     History   Chief Complaint Chief Complaint  Patient presents with  . Atrial Fibrillation    HPI [name] [name] is a 77 y.o. female with a hx of obesity, anemia, hypertension, polymyalgia rheumatica, A. fib with RVR, dilated cardiomyopathy, acute systolic congestive heart failure presents to the Emergency Department complaining of gradual, persistent, progressively worsening and shortness of breath persistent since her discharge from the hospital on 10/12/17 with a shortness of breath acutely worsening tonight.  Patient reports walking and movement makes her symptoms significantly worse.  Resting only makes them slightly better.  She denies fevers or chills, cough, congestion, chest pain, abdominal pain,  vomiting, diarrhea.  Does endorse some associated nausea tonight.  Record review shows that patient was admitted on 10/11/2017 for a new diagnosis of A. fib with RVR.  At that time she was placed on diltiazem drip and metoprolol.  Eliquis was started and patient had TEE/DCCV performed on 10/11/17 with conversion to normal sinus rhythm.  At that time her EF was noted to be 45%.  Patient remained in normal sinus rhythm and was discharged the next day.  Due to her deconditioning, patient was discharged home on oxygen at 2 L via nasal cannula.  Patient discharged home on Eliquis, carvedilol, Lasix, Cozaar, Coreg and K-Dur.  She reports that she has not felt herself since her discharge from the hospital but her shortness of breath acutely worsened tonight.  She reports compliance with all of her medications and has been wearing her oxygen around-the-clock.  She denies a known weight gain.  She reports her leg swelling is at baseline.  She reports she had scheduled follow-up with cardiology this morning.          The history is provided by the patient, medical records and the  spouse. No language interpreter was used.    Past Medical History:  Diagnosis Date  . Arthritis   . Complication of anesthesia   . Hypertension   . Neuropathy   . Polymyalgia (HCC)   . PONV (postoperative nausea and vomiting)     Patient Active Problem List   Diagnosis Date Noted  . Polymyalgia (HCC) 10/17/2017  . Chronic systolic CHF (congestive heart failure) (HCC) 10/17/2017  . DCM (dilated cardiomyopathy) (HCC)   . Atrial fibrillation with RVR (HCC) 10/07/2017  . HTN (hypertension) 10/07/2017  . Neuropathy 10/07/2017    Past Surgical History:  Procedure Laterality Date  . ABDOMINAL HYSTERECTOMY    . CARDIOVERSION N/A 10/11/2017   Procedure: CARDIOVERSION;  Surgeon: Elease Hashimoto Deloris Ping, MD;  Location: [facility] ENDOSCOPY;  Service: Cardiovascular;  Laterality: N/A;  . CHOLECYSTECTOMY    . KNEE ARTHROSCOPY    . TEE WITHOUT CARDIOVERSION N/A 10/11/2017   Procedure: TRANSESOPHAGEAL ECHOCARDIOGRAM (TEE);  Surgeon: Elease Hashimoto Deloris Ping, MD;  Location: [facility] ENDOSCOPY;  Service: Cardiovascular;  Laterality: N/A;     OB History   None      Home Medications    Prior to Admission medications   Medication Sig Start Date End Date Taking? Authorizing Provider  alendronate (FOSAMAX) 70 MG tablet Take 70 mg by mouth once a week. Take with a full glass of water on an empty stomach.   Yes [provider]  apixaban (ELIQUIS) 5 MG TABS tablet Take 1 tablet (5 mg total) by mouth 2 (two) times daily. 10/12/17  Yes Tyrone Nine, MD  carvedilol (  COREG) 25 MG tablet Take 1 tablet (25 mg total) by mouth 2 (two) times daily with a meal. 10/12/17  Yes Tyrone Nine, MD  cholecalciferol (VITAMIN D) 1000 units tablet Take 2,000 Units by mouth daily.   Yes [provider]  colestipol (COLESTID) 1 g tablet Take 1 g by mouth daily.  10/02/17  Yes [provider]  ferrous sulfate 325 (65 FE) MG tablet Take 325 mg by mouth daily with breakfast.   Yes [provider]  folic acid  (FOLVITE) 1 MG tablet Take 1 mg by mouth daily. 09/19/17  Yes [provider]  furosemide (LASIX) 40 MG tablet Take 1 tablet (40 mg total) by mouth 2 (two) times daily. 10/12/17  Yes Tyrone Nine, MD  gabapentin (NEURONTIN) 300 MG capsule Take 300 mg by mouth at bedtime. 08/22/17  Yes [provider]  HYDROcodone-acetaminophen (NORCO) 10-325 MG tablet Take 1 tablet by mouth every 6 (six) hours as needed for pain. 09/29/17  Yes [provider]  losartan (COZAAR) 25 MG tablet Take 1 tablet (25 mg total) by mouth daily. 10/13/17  Yes Tyrone Nine, MD  methotrexate (RHEUMATREX) 2.5 MG tablet Take 15 mg by mouth once a week. 09/19/17  Yes [provider]  potassium chloride 20 MEQ TBCR Take 40 mEq by mouth daily. 10/12/17  Yes Tyrone Nine, MD  predniSONE (DELTASONE) 5 MG tablet Take 10 mg by mouth daily with breakfast.  09/04/17  Yes [provider]    Family History History reviewed. No pertinent family history.  Social History Social History   Tobacco Use  . Smoking status: Former Games developer  . Smokeless tobacco: Never Used  Substance Use Topics  . Alcohol use: Not on file  . Drug use: Not on file     Allergies   Fluoxetine; Adhesive [tape]; Demerol [meperidine hcl]; and Sulfa antibiotics   Review of Systems Review of Systems  Constitutional: Positive for fever. Negative for appetite change, diaphoresis, fatigue and unexpected weight change.  HENT: Negative for mouth sores.   Eyes: Negative for visual disturbance.  Respiratory: Positive for shortness of breath. Negative for cough, chest tightness and wheezing.   Cardiovascular: Positive for leg swelling. Negative for chest pain.  Gastrointestinal: Negative for abdominal pain, constipation, diarrhea, nausea and vomiting.  Endocrine: Negative for polydipsia, polyphagia and polyuria.  Genitourinary: Negative for dysuria, frequency, hematuria and urgency.  Musculoskeletal: Negative for back pain and  neck stiffness.  Skin: Negative for rash.  Allergic/Immunologic: Negative for immunocompromised state.  Neurological: Negative for syncope, light-headedness and headaches.  Hematological: Does not bruise/bleed easily.  Psychiatric/Behavioral: Negative for sleep disturbance. The patient is not nervous/anxious.      Physical Exam Updated Vital Signs BP 97/74   Pulse 64   Temp (!) 97.4 F (36.3 C) (Oral)   Resp 20   Ht 5\' 4"  (1.626 m)   Wt 131.5 kg (290 lb)   SpO2 97%   BMI 49.78 kg/m   Physical Exam  Constitutional: She appears well-developed and well-nourished. No distress.  Awake, alert, nontoxic appearance Obese female  HENT:  Head: Normocephalic and atraumatic.  Mouth/Throat: Oropharynx is clear and moist. No oropharyngeal exudate.  Eyes: Conjunctivae are normal. No scleral icterus.  Neck: Normal range of motion. Neck supple.  Cardiovascular: Intact distal pulses. An irregularly irregular rhythm present. Tachycardia present.  Pulses:      Radial pulses are 2+ on the right side, and 2+ on the left side.  Dorsalis pedis pulses are 2+ on the right side, and 2+ on the left side.  HR 130-150  Pulmonary/Chest: Effort normal and breath sounds normal. Tachypnea noted. No respiratory distress. She has no wheezes. She has no rales.  Equal chest expansion  Abdominal: Soft. Bowel sounds are normal. She exhibits no mass. There is no tenderness. There is no rebound and no guarding.  Musculoskeletal: Normal range of motion. She exhibits edema ( 2+ pitting, bilateral).  Neurological: She is alert.  Speech is clear and goal oriented Moves extremities without ataxia  Skin: Skin is warm and dry. She is not diaphoretic.  Psychiatric: She has a normal mood and affect.  Nursing note and vitals reviewed.    ED Treatments / Results  Labs (all labs ordered are listed, but only abnormal results are displayed) Labs Reviewed  BASIC METABOLIC PANEL - Abnormal; Notable for the following  components:      Result Value   Chloride 97 (*)    Glucose, Bld 126 (*)    Creatinine, Ser 1.09 (*)    GFR calc non Af Amer 47 (*)    GFR calc Af Amer 55 (*)    All other components within normal limits  CBC - Abnormal; Notable for the following components:   RBC 3.72 (*)    Hemoglobin 11.8 (*)    All other components within normal limits  BRAIN NATRIURETIC PEPTIDE - Abnormal; Notable for the following components:   B Natriuretic Peptide 606.4 (*)    All other components within normal limits  MAGNESIUM  I-STAT TROPONIN, ED    EKG EKG Interpretation  Date/Time:  Tuesday October 17 2017 03:07:01 EDT Ventricular Rate:  139 PR Interval:    QRS Duration: 83 QT Interval:  329 QTC Calculation: 501 R Axis:   -9 Text Interpretation:  Atrial fibrillation with rapid V-rate Paired ventricular premature complexes Low voltage, precordial leads Repolarization abnormality, prob rate related Confirmed by Drema Pry [phone]) on 10/17/2017 4:15:04 AM   Radiology Dg Chest Port 1 View  Result Date: 10/17/2017 CLINICAL DATA:  Acute onset of shortness of breath. Atrial fibrillation. EXAM: PORTABLE CHEST 1 VIEW COMPARISON:  Chest radiograph performed 11/17/2015 FINDINGS: The lungs are well-aerated. Minimal bibasilar atelectasis is noted. Mild vascular congestion is seen. There is no evidence of pleural effusion or pneumothorax. The cardiomediastinal silhouette is borderline normal in size. No acute osseous abnormalities are seen. IMPRESSION: Minimal bibasilar atelectasis noted. Mild vascular congestion seen. Electronically Signed   By: Roanna Raider M.D.   On: 10/17/2017 03:02    Procedures .Critical Care Performed by: Dierdre Forth, PA-C Authorized by: Dierdre Forth, PA-C   Critical care provider statement:    Critical care time (minutes):  45   Critical care time was exclusive of:  Separately billable procedures and treating other patients and teaching time   Critical care was  necessary to treat or prevent imminent or life-threatening deterioration of the following conditions:  Cardiac failure   Critical care was time spent personally by me on the following activities:  Development of treatment plan with patient or surrogate, discussions with consultants, evaluation of patient's response to treatment, examination of patient, obtaining history from patient or surrogate, ordering and performing treatments and interventions, ordering and review of laboratory studies, ordering and review of radiographic studies, pulse oximetry, re-evaluation of patient's condition and review of old charts   I assumed direction of critical care for this patient from another provider in my specialty: no     (including critical  care time)  Medications Ordered in ED Medications  apixaban (ELIQUIS) tablet 5 mg (has no administration in time range)  carvedilol (COREG) tablet 25 mg (has no administration in time range)  cholecalciferol (VITAMIN D) tablet 2,000 Units (has no administration in time range)  colestipol (COLESTID) tablet 1 g (has no administration in time range)  folic acid (FOLVITE) tablet 1 mg (has no administration in time range)  furosemide (LASIX) tablet 40 mg (has no administration in time range)  gabapentin (NEURONTIN) capsule 300 mg (has no administration in time range)  HYDROcodone-acetaminophen (NORCO) 10-325 MG per tablet 1 tablet (has no administration in time range)  losartan (COZAAR) tablet 25 mg (has no administration in time range)  potassium chloride SA (K-DUR,KLOR-CON) CR tablet 40 mEq (has no administration in time range)  predniSONE (DELTASONE) tablet 10 mg (has no administration in time range)  sodium chloride flush (NS) 0.9 % injection 3 mL (has no administration in time range)  sodium chloride flush (NS) 0.9 % injection 3 mL (has no administration in time range)  sodium chloride flush (NS) 0.9 % injection 3 mL (has no administration in time range)  0.9 %   sodium chloride infusion (has no administration in time range)  acetaminophen (TYLENOL) tablet 650 mg (has no administration in time range)    Or  acetaminophen (TYLENOL) suppository 650 mg (has no administration in time range)  ondansetron (ZOFRAN) tablet 4 mg (has no administration in time range)    Or  ondansetron (ZOFRAN) injection 4 mg (has no administration in time range)  senna-docusate (Senokot-S) tablet 1 tablet (has no administration in time range)  bisacodyl (DULCOLAX) EC tablet 5 mg (has no administration in time range)  amiodarone (NEXTERONE PREMIX) 360-4.14 MG/200ML-% (1.8 mg/mL) IV infusion (60 mg/hr Intravenous Not Given 10/17/17 0532)  amiodarone (NEXTERONE PREMIX) 360-4.14 MG/200ML-% (1.8 mg/mL) IV infusion (has no administration in time range)  diltiazem (CARDIZEM) 1 mg/mL load via infusion 10 mg (10 mg Intravenous Bolus from Bag 10/17/17 0301)  metoprolol tartrate (LOPRESSOR) injection 5 mg (5 mg Intravenous Given 10/17/17 0426)  amiodarone (NEXTERONE PREMIX) 360-4.14 MG/200ML-% (1.8 mg/mL) IV infusion (60 mg/hr Intravenous New Bag/Given 10/17/17 0438)     Initial Impression / Assessment and Plan / ED Course  I have reviewed the triage vital signs and the nursing notes.  Pertinent labs & imaging results that were available during my care of the patient were reviewed by me and considered in my medical decision making (see chart for details).  Clinical Course as of Oct 18 607  Tue Oct 17, 2017  0330 Pt receiving diltiazem.  She reports she is feeling better and heart rate has improved some but she remains in A. fib with RVR.   [HM]  0408 Discussed with Dr. Hal [facility] who will consult.    [HM]  0431 She continues to be in A. fib with RVR.  Diltiazem stopped.  Patient will receive Toprol IV push and amiodarone.   [HM]  0431 Improved from previous.  B Natriuretic Peptide(!): 606.4 [HM]  0431 Negative troponin  Troponin i, poc: 0.02 [HM]  0442 Discussed with Dr. Antionette Charpyd who will  admit   [HM]    Clinical Course User Index [HM] Feliberto Stockley, Dahlia ClientHannah, PA-C    Patient presents with A. fib with RVR.  She was cardioverted last week for new diagnosis of A. fib.  At that time she had TEE which showed EF of 45%.  Patient has chronic swelling in her legs.  She had acute worsening  of shortness of breath tonight however she was unable to pinpoint the time at which her A. fib began.  She has not missed any doses of her Eliquis but due to unknown onset and anticoagulation of less than 3 weeks she will not be cardioverted here in the emergency department.  Patient was started on diltiazem drip with some improvement in heart rate but no conversion.  Discussed with cardiology who recommends metoprolol and amiodarone drip.  Also, cardiology recommends hospitalist admission.  Patient has had steady improvement in her work of breathing and comfort throughout her time here in the emergency department.  The patient was discussed with and seen by Dr. Eudelia Bunch who agrees with the treatment plan.  BP 106/79   Pulse 98   Temp (!) 97.4 F (36.3 C) (Oral)   Resp (!) 22   Ht 5\' 4"  (1.626 m)   Wt 131.5 kg (290 lb)   SpO2 97%   BMI 49.78 kg/m     Final Clinical Impressions(s) / ED Diagnoses   Final diagnoses:  Atrial fibrillation with RVR (HCC)  SOB (shortness of breath)  Chronic congestive heart failure, unspecified heart failure type [facility])    ED Discharge Orders    None       Milta Deiters 10/17/17 4098    Nira Conn, MD 10/18/17 [phone]

## 2017-10-18 ENCOUNTER — Encounter: Payer: Self-pay | Admitting: Cardiology

## 2017-10-18 LAB — CBC
HCT: 36.3 % (ref 36.0–46.0)
HEMOGLOBIN: 11.4 g/dL — AB (ref 12.0–15.0)
MCH: 31.8 pg (ref 26.0–34.0)
MCHC: 31.4 g/dL (ref 30.0–36.0)
MCV: 101.1 fL — ABNORMAL HIGH (ref 78.0–100.0)
PLATELETS: 239 10*3/uL (ref 150–400)
RBC: 3.59 MIL/uL — AB (ref 3.87–5.11)
RDW: 14.2 % (ref 11.5–15.5)
WBC: 7.8 10*3/uL (ref 4.0–10.5)

## 2017-10-18 LAB — BASIC METABOLIC PANEL
Anion gap: 9 (ref 5–15)
BUN: 16 mg/dL (ref 6–20)
CHLORIDE: 100 mmol/L — AB (ref 101–111)
CO2: 28 mmol/L (ref 22–32)
CREATININE: 1.12 mg/dL — AB (ref 0.44–1.00)
Calcium: 8.4 mg/dL — ABNORMAL LOW (ref 8.9–10.3)
GFR, EST AFRICAN AMERICAN: 53 mL/min — AB (ref >60)
GFR, EST NON AFRICAN AMERICAN: 46 mL/min — AB (ref >60)
Glucose, Bld: 111 mg/dL — ABNORMAL HIGH (ref 65–99)
POTASSIUM: 3.5 mmol/L (ref 3.5–5.1)
SODIUM: 137 mmol/L (ref 135–145)

## 2017-10-18 MED ORDER — CARVEDILOL 25 MG PO TABS
50.0000 mg | ORAL_TABLET | Freq: Two times a day (BID) | ORAL | Status: DC
  Administered 2017-10-18 – 2017-10-21 (×6): 50 mg via ORAL
  Filled 2017-10-18 (×8): qty 2

## 2017-10-18 MED ORDER — ALUM & MAG HYDROXIDE-SIMETH 200-200-20 MG/5ML PO SUSP
30.0000 mL | Freq: Four times a day (QID) | ORAL | Status: DC | PRN
  Administered 2017-10-18 – 2017-10-25 (×9): 30 mL via ORAL
  Filled 2017-10-18 (×9): qty 30

## 2017-10-18 NOTE — Progress Notes (Signed)
PROGRESS NOTE    [name] [name]  YNW:[id number] DOB: [birth year] DOA: 10/17/2017 PCP: Leanora Ivanoff., MD      Brief Narrative:  [name] [name] is a 77 y.o. F with polymyalgia, hypertension, chronic systolic CHF, and atrial fibrillation on Eliquis, now presenting to the emergency department with exertional dyspnea and palpitations, found to be back in Afib with RVR.   Assessment & Plan:  Atrial for ablation with RVR HR still elevated -Continue Eliquis -Continue amiodarone load, Cardiology are mulling repeat Cardioversion -Continue BB, increased by Cardiology today  Chronic systolic CHF Appears euvolemic -Continue fursoemide  Inflammatory arthritis -Continue methotrexate and folate-continue prednisone    DVT prophylaxis: Not applicable Code Status: Full code Family Communication: Has been at the bedside MDM and disposition Plan: The below labs and imaging reports were reviewed and reviewed above.  The patient's status is clinically improved.     Consultants:   Cardiology    Subjective: Feeling less dyspneic today, no chest pain, no chest pressure.  No confusion, fever, cough, sputum.  Leg swelling Is improved, leg discoloration is better.  Objective: Vitals:   10/18/17 0402 10/18/17 0406 10/18/17 0758 10/18/17 1218  BP:  98/75 99/71 107/82  Pulse:  82 (!) 107 87  Resp:  16    Temp:  98.1 F (36.7 C) 97.8 F (36.6 C) 98.6 F (37 C)  TempSrc:  Oral Oral Oral  SpO2:  100% 94% 99%  Weight: 132 kg (291 lb)     Height:        Intake/Output Summary (Last 24 hours) at 10/18/2017 1709 Last data filed at 10/18/2017 1234 Gross per 24 hour  Intake 1637.34 ml  Output 1300 ml  Net 337.34 ml   Filed Weights   10/17/17 0235 10/18/17 0402  Weight: 131.5 kg (290 lb) 132 kg (291 lb)    Examination: General appearance: Obese adult female, alert and in no acute distress.   Sitting on side of bed. HEENT: Anicteric, conjunctiva pink, lids and lashes normal. No nasal  deformity, discharge, epistaxis.  Lips moist, teeth normal, OP moist, no oral elsions.   Skin: Warm and dry.  no jaundice.  No suspicious rashes or lesions.  Moderate chronic venous stasis changes to bilateral legs Cardiac: RRR, nl S1-S2, no murmurs appreciated.  Capillary refill is brisk.  JVP normal.  3+ LE edema.  Radial pulses 2+ and symmetric. Respiratory: Normal respiratory rate and rhythm.  No wheezes, scant rales at bilateral bases. Abdomen: Abdomen soft.  No TTP. No ascites, distension, hepatosplenomegaly.   MSK: No deformities or effusions. Neuro: Awake and alert.  EOMI, moves all extremities. Speech fluent.    Psych: Sensorium intact and responding to questions, attention normal. Affect normal.  Judgment and insight appear normal.    Data Reviewed: I have personally reviewed following labs and imaging studies:  CBC: Recent Labs  Lab 10/12/17 0401 10/17/17 0251 10/18/17 0431  WBC 8.3 8.5 7.8  HGB 10.5* 11.8* 11.4*  HCT 32.2* 36.6 36.3  MCV 98.8 98.4 101.1*  PLT 216 274 239   Basic Metabolic Panel: Recent Labs  Lab 10/12/17 0401 10/17/17 0251 10/18/17 0431  NA 139 137 137  K 3.1* 3.7 3.5  CL 94* 97* 100*  CO2 34* 29 28  GLUCOSE 129* 126* 111*  BUN 17 17 16   CREATININE 1.10* 1.09* 1.12*  CALCIUM 8.4* 8.9 8.4*  MG  --  2.0  --    GFR: Estimated Creatinine Clearance: 55.9 mL/min (A) (by C-G formula based on  SCr of 1.12 mg/dL (H)). Liver Function Tests: No results for input(s): AST, ALT, ALKPHOS, BILITOT, PROT, ALBUMIN in the last 168 hours. No results for input(s): LIPASE, AMYLASE in the last 168 hours. No results for input(s): AMMONIA in the last 168 hours. Coagulation Profile: No results for input(s): INR, PROTIME in the last 168 hours. Cardiac Enzymes: No results for input(s): CKTOTAL, CKMB, CKMBINDEX, TROPONINI in the last 168 hours. BNP (last 3 results) No results for input(s): PROBNP in the last 8760 hours. HbA1C: No results for input(s): HGBA1C in  the last 72 hours. CBG: No results for input(s): GLUCAP in the last 168 hours. Lipid Profile: No results for input(s): CHOL, HDL, LDLCALC, TRIG, CHOLHDL, LDLDIRECT in the last 72 hours. Thyroid Function Tests: No results for input(s): TSH, T4TOTAL, FREET4, T3FREE, THYROIDAB in the last 72 hours. Anemia Panel: No results for input(s): VITAMINB12, FOLATE, FERRITIN, TIBC, IRON, RETICCTPCT in the last 72 hours. Urine analysis:    Component Value Date/Time   COLORURINE STRAW (A) 10/17/2017 1616   APPEARANCEUR CLEAR 10/17/2017 1616   LABSPEC 1.005 10/17/2017 1616   PHURINE 7.0 10/17/2017 1616   GLUCOSEU NEGATIVE 10/17/2017 1616   HGBUR NEGATIVE 10/17/2017 1616   BILIRUBINUR NEGATIVE 10/17/2017 1616   KETONESUR NEGATIVE 10/17/2017 1616   PROTEINUR NEGATIVE 10/17/2017 1616   UROBILINOGEN 0.2 10/27/2006 1337   NITRITE NEGATIVE 10/17/2017 1616   LEUKOCYTESUR SMALL (A) 10/17/2017 1616   Sepsis Labs: @LABRCNTIP (procalcitonin:4,lacticacidven:4)  ) Recent Results (from the past 240 hour(s))  MRSA PCR Screening     Status: None   Collection Time: 10/09/17  8:09 PM  Result Value Ref Range Status   MRSA by PCR NEGATIVE NEGATIVE Final    Comment:        The GeneXpert MRSA Assay (FDA approved for NASAL specimens only), is one component of a comprehensive MRSA colonization surveillance program. It is not intended to diagnose MRSA infection nor to guide or monitor treatment for MRSA infections. Performed at [facility], [address] [place], [place] [address]   Culture, Urine     Status: Abnormal (Preliminary result)   Collection Time: 10/17/17  6:01 PM  Result Value Ref Range Status   Specimen Description URINE, RANDOM  Final   Special Requests   Final    NONE Performed at [facility], [address] [place], [place] [address]    Culture >=100,000 COLONIES/mL ENTEROCOCCUS FAECALIS (A)  Final   Report Status PENDING  Incomplete         Radiology Studies: Dg  Chest Port 1 View  Result Date: 10/17/2017 CLINICAL DATA:  Acute onset of shortness of breath. Atrial fibrillation. EXAM: PORTABLE CHEST 1 VIEW COMPARISON:  Chest radiograph performed 11/17/2015 FINDINGS: The lungs are well-aerated. Minimal bibasilar atelectasis is noted. Mild vascular congestion is seen. There is no evidence of pleural effusion or pneumothorax. The cardiomediastinal silhouette is borderline normal in size. No acute osseous abnormalities are seen. IMPRESSION: Minimal bibasilar atelectasis noted. Mild vascular congestion seen. Electronically Signed   By: Roanna Raider M.D.   On: 10/17/2017 03:02        Scheduled Meds: . apixaban  5 mg Oral BID  . carvedilol  50 mg Oral BID WC  . cholecalciferol  2,000 Units Oral Daily  . colestipol  1 g Oral Daily  . folic acid  1 mg Oral Daily  . furosemide  40 mg Intravenous BID  . gabapentin  300 mg Oral QHS  . mouth rinse  15  mL Mouth Rinse BID  . potassium chloride SA  40 mEq Oral Daily  . predniSONE  10 mg Oral Q breakfast  . sodium chloride flush  3 mL Intravenous Q12H  . sodium chloride flush  3 mL Intravenous Q12H   Continuous Infusions: . sodium chloride    . amiodarone 30 mg/hr (10/18/17 0814)  . cefTRIAXone (ROCEPHIN)  IV Stopped (10/17/17 1904)     LOS: 1 day    Time spent: 25 minutes    Alberteen Samhristopher P Misheel Gowans, MD [facility] 10/18/2017, 5:09 PM     Pager [phone] --- please page though AMION:  [email or web address] Password [email or web address] If 7PM-7AM, please contact night-coverage

## 2017-10-18 NOTE — Progress Notes (Addendum)
Progress Note  Patient Name: [name] [name] Date of Encounter: 10/18/2017  Primary Cardiologist: Dr. Tomie [name]  Subjective   Continues to be fatigued.  Reports breathing is better today.  Denies chest pain or palpitations.  Inpatient Medications    Scheduled Meds: . apixaban  5 mg Oral BID  . carvedilol  37.5 mg Oral BID WC  . cholecalciferol  2,000 Units Oral Daily  . colestipol  1 g Oral Daily  . folic acid  1 mg Oral Daily  . furosemide  40 mg Intravenous BID  . gabapentin  300 mg Oral QHS  . losartan  25 mg Oral Daily  . mouth rinse  15 mL Mouth Rinse BID  . potassium chloride SA  40 mEq Oral Daily  . predniSONE  10 mg Oral Q breakfast  . sodium chloride flush  3 mL Intravenous Q12H  . sodium chloride flush  3 mL Intravenous Q12H   Continuous Infusions: . sodium chloride    . amiodarone 30 mg/hr (10/18/17 0814)  . cefTRIAXone (ROCEPHIN)  IV Stopped (10/17/17 1904)   PRN Meds: sodium chloride, acetaminophen **OR** acetaminophen, bisacodyl, HYDROcodone-acetaminophen, ondansetron **OR** ondansetron (ZOFRAN) IV, senna-docusate, sodium chloride flush   Vital Signs    Vitals:   10/17/17 2319 10/18/17 0402 10/18/17 0406 10/18/17 0758  BP: 114/71  98/75 99/71  Pulse: 85  82 (!) 107  Resp: 18  16   Temp: 97.7 F (36.5 C)  98.1 F (36.7 C) 97.8 F (36.6 C)  TempSrc: Oral  Oral Oral  SpO2: 100%  100% 94%  Weight:  291 lb (132 kg)    Height:        Intake/Output Summary (Last 24 hours) at 10/18/2017 0825 Last data filed at 10/18/2017 0659 Gross per 24 hour  Intake 1011.83 ml  Output 2450 ml  Net -1438.17 ml   Filed Weights   10/17/17 0235 10/18/17 0402  Weight: 290 lb (131.5 kg) 291 lb (132 kg)   Physical Exam   General: Obese, NAD Skin: Warm, dry, intact  Head: Normocephalic, atraumatic,  clear, moist mucus membranes. Neck: Negative for carotid bruits. No JVD Lungs: Bilateral upper/lower lobe crackles. Breathing is unlabored. Cardiovascular: Irregularly  irregular with S1 S2. No murmurs, rubs or, gallops Abdomen: Soft, non-tender, non-distended with normoactive bowel sounds. No obvious abdominal masses. MSK: Strength and tone appear normal for age. 5/5 in all extremities Extremities: BLE 2+ edema. No clubbing or cyanosis. DP/PT pulses 1+ bilaterally Neuro: Alert and oriented. No focal deficits. No facial asymmetry. MAE spontaneously. Psych: Responds to questions appropriately with normal affect.    Labs    Chemistry Recent Labs  Lab 10/12/17 0401 10/17/17 0251 10/18/17 0431  NA 139 137 137  K 3.1* 3.7 3.5  CL 94* 97* 100*  CO2 34* 29 28  GLUCOSE 129* 126* 111*  BUN 17 17 16   CREATININE 1.10* 1.09* 1.12*  CALCIUM 8.4* 8.9 8.4*  GFRNONAA 47* 47* 46*  GFRAA 54* 55* 53*  ANIONGAP 11 11 9      Hematology Recent Labs  Lab 10/12/17 0401 10/17/17 0251 10/18/17 0431  WBC 8.3 8.5 7.8  RBC 3.26* 3.72* 3.59*  HGB 10.5* 11.8* 11.4*  HCT 32.2* 36.6 36.3  MCV 98.8 98.4 101.1*  MCH 32.2 31.7 31.8  MCHC 32.6 32.2 31.4  RDW 13.6 14.0 14.2  PLT 216 274 239    Cardiac EnzymesNo results for input(s): TROPONINI in the last 168 hours.  Recent Labs  Lab 10/17/17 0257  TROPIPOC 0.02  BNP Recent Labs  Lab 10/17/17 0251  BNP 606.4*     DDimer No results for input(s): DDIMER in the last 168 hours.   Radiology    Dg Chest Port 1 View  Result Date: 10/17/2017 CLINICAL DATA:  Acute onset of shortness of breath. Atrial fibrillation. EXAM: PORTABLE CHEST 1 VIEW COMPARISON:  Chest radiograph performed 11/17/2015 FINDINGS: The lungs are well-aerated. Minimal bibasilar atelectasis is noted. Mild vascular congestion is seen. There is no evidence of pleural effusion or pneumothorax. The cardiomediastinal silhouette is borderline normal in size. No acute osseous abnormalities are seen. IMPRESSION: Minimal bibasilar atelectasis noted. Mild vascular congestion seen. Electronically Signed   By: Roanna Raider M.D.   On: 10/17/2017 03:02    Telemetry    10/18/2017: Atrial fibrillation with HR variable, 100-120- Personally Reviewed  ECG    New tracing as of 10/18/2017- Personally Reviewed  Cardiac Studies   ECHO: TEE cardioversion 10/11/2017: Study Conclusions  - Left ventricle: No evidence of thrombus. - Mitral valve: There was moderate to severe regurgitation. - Left atrium: No evidence of thrombus in the atrial cavity or appendage. - Tricuspid valve: There was mild-moderate regurgitation.  Impressions:  - Successful cardioversion. No cardiac source of emboli was indentified.  Echocardiogram 10/08/2017: Study Conclusions  - Left ventricle: The cavity size was normal. Wall thickness was increased in a pattern of mild LVH. Systolic function was mildly reduced. The estimated ejection fraction was in the range of 45% to 50%. Diffuse hypokinesis. The study is not technically sufficient to allow evaluation of LV diastolic function. Ejection fraction (MOD, 2-plane): 47%. - Aortic valve: Trileaflet. Sclerosis without stenosis. There was no regurgitation. - Mitral valve: Mildly thickened leaflets . There was moderate regurgitation. - Left atrium: The atrium was normal in size. - Inferior vena cava: The vessel was dilated. The respirophasic diameter changes were blunted (<50%), consistent with elevated central venous pressure.  Impressions:  - LVEF 45-50%, mild LVH, global hypokinesis, moderate MR, normal LA size, dilated IVC.  Patient Profile     76 y.o. female with a hx of HTN, chronic systolic heart failure, recent recurrence atrial fibrillation on Eliquis (intial dx 4-5 years ago) and polymyalgia who is being seen by Cardiology for the evaluation of atrial fibrillation.   Assessment & Plan    1.  Atrial fibrillation with RVR: -Rates continue to be variable, maintaining in the low 100s at baseline -We will need to stabilize acute systolic heart failure symptoms, continue  amiodarone gtt for loading and then consider possible DCCV in several days -Continue Eliquis, carvedilol 37.5 -Consider stopping losartan and increasing carvedilol for greater rate control? -CHA2DS2VASc = 5 (age x2, sex, CHF, hypertension)  2.  Acute on chronic systolic heart failure: -Per echocardiogram 10/08/2017 with LVEF 45 to 50% with diffuse hypokinesis, moderate MR, normal LA size and dilated IVC -Weight, 291lb today, 290lb on admission -I&O, net -1.2 L since admission, total 24-hour output 2.6 L -Creatinine, 1.12, slightly up from yesterday at 1.09 -IV Lasix 40 mg twice daily -Crackers in BLL and BUL  3.  HTN: -Low normal, 99/71, 98/75, 114/71 -Reports dizziness when changing positions from the lying to sitting positions -Educated on calling for assistance for ambulation -Carvedilol 37.5, losartan 25  4.  Polymyalgia: -Denies pain, on chronic methotrexate, prednisone  5.  UTI: -She reports a long history of chronic UTI. -UA on admission positive for hyaline casts, antibiotics per primary team   Signed, Georgie Chard NP-C [facility] Pager: [phone] 10/18/2017, 8:25 AM  For questions or updates, please contact   Please consult [email or web address] for contact info under Cardiology/STEMI.   Attending Note:   The patient was seen and examined.  Agree with assessment and plan as noted above.  Changes made to the above note as needed.  Patient seen and independently examined with Georgie Chard. NP .   We discussed all aspects of the encounter. I agree with the assessment and plan as stated above.  1. Atrial fib: The patient's heart rate is still fairly rapid. Seems to have slowed a bit.   We increase the carvedilol yesterday to 37.5 mg twice a day.  We will increase it further to 50 mg twice a day today. Continue amio drip .   2.  Acute on chronic systolic and diastolic congestive heart failure: The patient is diuresed 1.2 L so far during this admission.   EF should  improve as we control her HR better.     I have spent a total of 40 minutes with patient reviewing hospital  notes , telemetry, EKGs, labs and examining patient as well as establishing an assessment and plan that was discussed with the patient. > 50% of time was spent in direct patient care.    [name], Montez Hageman., MD, [facility] 10/18/2017, 9:01 AM [address]  [address] Office [phone] Pager [phone]

## 2017-10-19 LAB — URINE CULTURE: Culture: >=100000 — AB

## 2017-10-19 LAB — BASIC METABOLIC PANEL
ANION GAP: 9 (ref 5–15)
BUN: 16 mg/dL (ref 6–20)
CHLORIDE: 100 mmol/L — AB (ref 101–111)
CO2: 29 mmol/L (ref 22–32)
Calcium: 8.3 mg/dL — ABNORMAL LOW (ref 8.9–10.3)
Creatinine, Ser: 1.06 mg/dL — ABNORMAL HIGH (ref 0.44–1.00)
GFR calc non Af Amer: 49 mL/min — ABNORMAL LOW (ref >60)
GFR, EST AFRICAN AMERICAN: 57 mL/min — AB (ref >60)
GLUCOSE: 114 mg/dL — AB (ref 65–99)
Potassium: 3.3 mmol/L — ABNORMAL LOW (ref 3.5–5.1)
Sodium: 138 mmol/L (ref 135–145)

## 2017-10-19 MED ORDER — AMOXICILLIN 250 MG PO CAPS
500.0000 mg | ORAL_CAPSULE | Freq: Two times a day (BID) | ORAL | Status: AC
  Administered 2017-10-19 – 2017-10-23 (×9): 500 mg via ORAL
  Filled 2017-10-19 (×3): qty 1
  Filled 2017-10-19: qty 2
  Filled 2017-10-19 (×5): qty 1
  Filled 2017-10-19 (×2): qty 2

## 2017-10-19 MED ORDER — FUROSEMIDE 10 MG/ML IJ SOLN
60.0000 mg | Freq: Two times a day (BID) | INTRAMUSCULAR | Status: DC
  Administered 2017-10-19 – 2017-10-20 (×2): 60 mg via INTRAVENOUS
  Filled 2017-10-19 (×2): qty 6

## 2017-10-19 NOTE — Plan of Care (Signed)
  Problem: Clinical Measurements: Goal: Respiratory complications will improve Outcome: Progressing Note:  No s/s of respiratory complications noted.  Oxygen saturation WNL on 2L/.   Problem: Coping: Goal: Level of anxiety will decrease Outcome: Progressing Note:  No s/s of anxiety noted.   Problem: Elimination: Goal: Will not experience complications related to urinary retention Outcome: Progressing Note:  No s/s of urinary retention noted.

## 2017-10-19 NOTE — Plan of Care (Signed)
Diuresing with IV lasix. Monitor Intake and Output

## 2017-10-19 NOTE — Progress Notes (Addendum)
Progress Note  Patient Name: [name] [name] Date of Encounter: 10/19/2017  Primary Cardiologist: Dr. Tomie China  Subjective   Pt reports feeling "full" in her abdomen today, as if she is holding onto more fluid.  She had poor UO yesterday with an increase in her weight today.  Reports breathing better and no chest pain.  Inpatient Medications    Scheduled Meds: . apixaban  5 mg Oral BID  . carvedilol  50 mg Oral BID WC  . cholecalciferol  2,000 Units Oral Daily  . colestipol  1 g Oral Daily  . folic acid  1 mg Oral Daily  . furosemide  40 mg Intravenous BID  . gabapentin  300 mg Oral QHS  . mouth rinse  15 mL Mouth Rinse BID  . potassium chloride SA  40 mEq Oral Daily  . predniSONE  10 mg Oral Q breakfast  . sodium chloride flush  3 mL Intravenous Q12H  . sodium chloride flush  3 mL Intravenous Q12H   Continuous Infusions: . sodium chloride    . amiodarone 30 mg/hr (10/18/17 2305)  . cefTRIAXone (ROCEPHIN)  IV 1 g (10/18/17 1738)   PRN Meds: sodium chloride, acetaminophen **OR** acetaminophen, alum & mag hydroxide-simeth, bisacodyl, HYDROcodone-acetaminophen, ondansetron **OR** ondansetron (ZOFRAN) IV, senna-docusate, sodium chloride flush   Vital Signs    Vitals:   10/19/17 0022 10/19/17 0436 10/19/17 0500 10/19/17 0722  BP: 108/74 107/73  102/69  Pulse: 99 84  (!) 116  Resp:      Temp: 97.7 F (36.5 C) (!) 97.5 F (36.4 C)  97.7 F (36.5 C)  TempSrc: Oral Oral  Oral  SpO2: 97% 99%  96%  Weight:   293 lb 12.8 oz (133.3 kg)   Height:        Intake/Output Summary (Last 24 hours) at 10/19/2017 0757 Last data filed at 10/19/2017 0600 Gross per 24 hour  Intake 1463.9 ml  Output 750 ml  Net 713.9 ml   Filed Weights   10/17/17 0235 10/18/17 0402 10/19/17 0500  Weight: 290 lb (131.5 kg) 291 lb (132 kg) 293 lb 12.8 oz (133.3 kg)    Physical Exam   General: Obese, NAD Skin: Warm, dry, intact  Head: Normocephalic, atraumatic, clear, moist mucus  membranes. Neck: Negative for carotid bruits. No JVD Lungs: Mild crackles in LLL. No wheezes. Breathing is unlabored. Cardiovascular: Irregularly irregular with S1 S2. No murmurs, rubs or   Abdomen: Firm, non-tender, non-distended with normoactive bowel sounds. No obvious abdominal masses. MSK: Strength and tone appear normal for age. 5/5 in all extremities Extremities: BLE 2+ edema. No clubbing or cyanosis. DP/PT pulses 1+ bilaterally Neuro: Alert and oriented. No focal deficits. No facial asymmetry. MAE spontaneously. Psych: Responds to questions appropriately with normal affect.    Labs    Chemistry Recent Labs  Lab 10/17/17 0251 10/18/17 0431 10/19/17 0346  NA 137 137 138  K 3.7 3.5 3.3*  CL 97* 100* 100*  CO2 29 28 29   GLUCOSE 126* 111* 114*  BUN 17 16 16   CREATININE 1.09* 1.12* 1.06*  CALCIUM 8.9 8.4* 8.3*  GFRNONAA 47* 46* 49*  GFRAA 55* 53* 57*  ANIONGAP 11 9 9      Hematology Recent Labs  Lab 10/17/17 0251 10/18/17 0431  WBC 8.5 7.8  RBC 3.72* 3.59*  HGB 11.8* 11.4*  HCT 36.6 36.3  MCV 98.4 101.1*  MCH 31.7 31.8  MCHC 32.2 31.4  RDW 14.0 14.2  PLT 274 239  Cardiac EnzymesNo results for input(s): TROPONINI in the last 168 hours.  Recent Labs  Lab 10/17/17 0257  TROPIPOC 0.02     BNP Recent Labs  Lab 10/17/17 0251  BNP 606.4*     DDimer No results for input(s): DDIMER in the last 168 hours.   Radiology    No results found.  Telemetry    10/19/2017 atrial fibrillation HR 110s- Personally Reviewed  ECG    No new tracings as of 10/19/2017- Personally Reviewed  Cardiac Studies   ECHO: TEE cardioversion 10/11/2017: Study Conclusions  - Left ventricle: No evidence of thrombus. - Mitral valve: There was moderate to severe regurgitation. - Left atrium: No evidence of thrombus in the atrial cavity or appendage. - Tricuspid valve: There was mild-moderate regurgitation.  Impressions:  - Successful cardioversion. No cardiac source  of emboli was indentified.  Echocardiogram 10/08/2017: Study Conclusions  - Left ventricle: The cavity size was normal. Wall thickness was increased in a pattern of mild LVH. Systolic function was mildly reduced. The estimated ejection fraction was in the range of 45% to 50%. Diffuse hypokinesis. The study is not technically sufficient to allow evaluation of LV diastolic function. Ejection fraction (MOD, 2-plane): 47%. - Aortic valve: Trileaflet. Sclerosis without stenosis. There was no regurgitation. - Mitral valve: Mildly thickened leaflets . There was moderate regurgitation. - Left atrium: The atrium was normal in size. - Inferior vena cava: The vessel was dilated. The respirophasic diameter changes were blunted (<50%), consistent with elevated central venous pressure.  Impressions:  - LVEF 45-50%, mild LVH, global hypokinesis, moderate MR, normal LA size, dilated IVC.  Patient Profile     77 y.o. female  with a hx of HTN, chronic systolic heart failure, recent recurrence atrial fibrillation on Eliquis (intial dx 4-5 years ago) and polymyalgia who is being seen by Cardiology for the evaluation of atrial fibrillation.  Assessment & Plan    1.  Atrial fibrillation with RVR: -Rates continue to be variable, maintaining in the low 110s at baseline -Continue amiodarone gtt for loading and then consider possible DCCV in several days -Continue Eliquis, carvedilol increased to 50 mg twice daily on 10/18/2017 -With her stable BP consider further increasing her carvedilol -BP stable, 102/69, 107/73, 108/74 -CHA2DS2VASc =5 (agex2, sex, CHF, hypertension)  2.  Acute on chronic systolic heart failure: -Per echocardiogram 10/08/2017 with LVEF 45 to 50% with diffuse hypokinesis, moderate MR, normal LA size and dilated IVC -Weight, 293lb today, 290lb on admission -I&O, net -924 mL since admission, total 24-hour output 750 mL -Creatinine, 1.06, slightly down  from yesterday at 1.12 -IV Lasix 40 mg twice daily>> consider increasing to 60 twice daily and monitor output closely -Continues to have LLL crackles and BLE edema  3.  HTN: -Stable, 102/69, 107/73, 108/74 -Reports no dizziness when changing positions from the lying to sitting positions, improved from yesterday -Educated on calling for assistance for ambulation -Carvedilol 50, losartan stopped 10/18/2017  4.  Polymyalgia: -Denies pain, on chronic methotrexate, prednisone  5.  UTI: -She reports a long history of chronic UTI. -UA on admission positive for hyaline casts, antibiotics per primary team   Signed, Georgie Chard NP-C [facility] Pager: [phone] 10/19/2017, 7:57 AM     For questions or updates, please contact   Please consult [email or web address] for contact info under Cardiology/STEMI.  Attending Note:   The patient was seen and examined.  Agree with assessment and plan as noted above.  Changes made to the above note as needed.  Patient seen  and independently examined with Georgie ChardJill McDaniel, NP .   We discussed all aspects of the encounter. I agree with the assessment and plan as stated above.  1.  Atrial fibrillation: Her rate is slowly improving but is still in the 100-1 10 range.  She remains on IV amiodarone.  She had a cardioversion a week ago which did not hold. Continue carvedilol for rate control.  Continue amiodarone. Continue Eliquis  2.  Acute on chronic systolic congestive heart failure: She is diuresed some.  Continue IV Lasix.    I have spent a total of 40 minutes with patient reviewing hospital  notes , telemetry, EKGs, labs and examining patient as well as establishing an assessment and plan that was discussed with the patient. > 50% of time was spent in direct patient care.    Vesta MixerPhilip J. Shericka Johnstone, Montez HagemanJr., MD, [facility] 10/19/2017, 12:19 PM [address],  [address] Office [phone] Pager [phone]

## 2017-10-19 NOTE — Progress Notes (Signed)
PROGRESS NOTE    [name] [name]  GNF:[id number] DOB: [birth year] DOA: 10/17/2017 PCP: [name] [name]., MD      Brief Narrative:  [name] [name] is a 77 y.o. F with inflammatory arthritis on methotrexate, prednisone and Norco, hypertension, CHF EF 45%, and atrial fibrillation on Eliquis who presented to the emergency department with exertional dyspnea and palpitations, found to be back in Afib with RVR.   Assessment & Plan:  Atrial fibrillation with RVR HR improved but still elevated.  CHADS2Vasc 5.   -Continue Eliqius -Continue IV amiodarone, per Cardiology -Cardioversion per Cardiology -Continue BB   Chronic systolic CHF EF 45%.  No previous history of CHF (was evaluated in Aug 2018 for weight gain, EF 50-55% at that time, did not appear congested to [facility]).  Today is more swollen, weight has increased. -Increase furosemide to 60 mg IV twice a day  -K supplement -Strict I/Os, daily weights, telemetry  -Daily monitoring renal function  Inflammatory arthritis -Continue methotrexate and folate -Continue prednisone    Normocytic anemia From chronic disease.  Stable.  UTI Dysuria plus urine culture with enterococcus. -Stop CTX, amoxicillin          DVT prophylaxis: Not applicable Code Status: Full code Family Communication: [name] [name] at bedside MDM and disposition Plan: The below labs and imaging reports were reviewed and summarized above.     She was admitted with A. fib with RVR, heart rate is now being slowed with IV amiodarone, and cardiology are planning inpatient cardioversion next few days.  In the meantime today she is showing new signs of congestive heart failure, for which we are increasing her IV diuresis.   Consultants:   Cardiology    Subjective: Abdominal distention and congestion feels worse today, leg swelling is worse.  Her breathing is unchanged, somewhat labored.  She is very out of breath with minimal exertion.  She has no chest  pain, palpitations, her heart rate is slowing, she feels better.  No fever, vomiting, flank pain.  Objective: Vitals:   10/19/17 0500 10/19/17 0722 10/19/17 0800 10/19/17 1126  BP:  102/69  93/71  Pulse:  (!) 116 (!) 52 78  Resp:      Temp:  97.7 F (36.5 C)  (!) 97.5 F (36.4 C)  TempSrc:  Oral  Oral  SpO2:  96% 97% 98%  Weight: 133.3 kg (293 lb 12.8 oz)     Height:        Intake/Output Summary (Last 24 hours) at 10/19/2017 1504 Last data filed at 10/19/2017 1226 Gross per 24 hour  Intake 1136.82 ml  Output 800 ml  Net 336.82 ml   Filed Weights   10/17/17 0235 10/18/17 0402 10/19/17 0500  Weight: 131.5 kg (290 lb) 132 kg (291 lb) 133.3 kg (293 lb 12.8 oz)    Examination: General appearance: Obese adult female, sitting on the side of the bed, conversational. HEENT: Anicteric, conjunctive are pink, lids and lashes normal.  No nasal deformity, discharge, epistaxis.  Lips moist, teeth normal, OP moist, no oral lesions. Skin: Skin warm and dry, chronic venous stasis changes to bilateral lower extremities. Cardiac: Tachycardic, regular, no murmurs appreciated.  JVP not visible.  3+ lower extremity edema bilaterally to the shin. Respiratory: Respiratory effort somewhat labored.  No wheezes.  Rales bilaterally just at the bases. Abdomen: Abdomen soft, no tenderness to palpation,.   MSK: No deformities or effusions in the large joints of the upper or lower extremities bilaterally. Neuro: Awake and alert, moves all  extremities equally, speech fluent.    Psych: Sensorium intact and responding to questions, attention normal, affect normal, judgment insight normal.    Data Reviewed: I have personally reviewed following labs and imaging studies:  CBC: Recent Labs  Lab 10/17/17 0251 10/18/17 0431  WBC 8.5 7.8  HGB 11.8* 11.4*  HCT 36.6 36.3  MCV 98.4 101.1*  PLT 274 239   Basic Metabolic Panel: Recent Labs  Lab 10/17/17 0251 10/18/17 0431 10/19/17 0346  NA 137 137 138  K  3.7 3.5 3.3*  CL 97* 100* 100*  CO2 29 28 29   GLUCOSE 126* 111* 114*  BUN 17 16 16   CREATININE 1.09* 1.12* 1.06*  CALCIUM 8.9 8.4* 8.3*  MG 2.0  --   --    GFR: Estimated Creatinine Clearance: 59.5 mL/min (A) (by C-G formula based on SCr of 1.06 mg/dL (H)). Liver Function Tests: No results for input(s): AST, ALT, ALKPHOS, BILITOT, PROT, ALBUMIN in the last 168 hours. No results for input(s): LIPASE, AMYLASE in the last 168 hours. No results for input(s): AMMONIA in the last 168 hours. Coagulation Profile: No results for input(s): INR, PROTIME in the last 168 hours. Cardiac Enzymes: No results for input(s): CKTOTAL, CKMB, CKMBINDEX, TROPONINI in the last 168 hours. BNP (last 3 results) No results for input(s): PROBNP in the last 8760 hours. HbA1C: No results for input(s): HGBA1C in the last 72 hours. CBG: No results for input(s): GLUCAP in the last 168 hours. Lipid Profile: No results for input(s): CHOL, HDL, LDLCALC, TRIG, CHOLHDL, LDLDIRECT in the last 72 hours. Thyroid Function Tests: No results for input(s): TSH, T4TOTAL, FREET4, T3FREE, THYROIDAB in the last 72 hours. Anemia Panel: No results for input(s): VITAMINB12, FOLATE, FERRITIN, TIBC, IRON, RETICCTPCT in the last 72 hours. Urine analysis:    Component Value Date/Time   COLORURINE STRAW (A) 10/17/2017 1616   APPEARANCEUR CLEAR 10/17/2017 1616   LABSPEC 1.005 10/17/2017 1616   PHURINE 7.0 10/17/2017 1616   GLUCOSEU NEGATIVE 10/17/2017 1616   HGBUR NEGATIVE 10/17/2017 1616   BILIRUBINUR NEGATIVE 10/17/2017 1616   KETONESUR NEGATIVE 10/17/2017 1616   PROTEINUR NEGATIVE 10/17/2017 1616   UROBILINOGEN 0.2 10/27/2006 1337   NITRITE NEGATIVE 10/17/2017 1616   LEUKOCYTESUR SMALL (A) 10/17/2017 1616   Sepsis Labs: @LABRCNTIP (procalcitonin:4,lacticacidven:4)  ) Recent Results (from the past 240 hour(s))  MRSA PCR Screening     Status: None   Collection Time: 10/09/17  8:09 PM  Result Value Ref Range Status    MRSA by PCR NEGATIVE NEGATIVE Final    Comment:        The GeneXpert MRSA Assay (FDA approved for NASAL specimens only), is one component of a comprehensive MRSA colonization surveillance program. It is not intended to diagnose MRSA infection nor to guide or monitor treatment for MRSA infections. Performed at [facility], [address] [place], [place] [address]   Culture, Urine     Status: Abnormal   Collection Time: 10/17/17  6:01 PM  Result Value Ref Range Status   Specimen Description URINE, RANDOM  Final   Special Requests   Final    NONE Performed at [facility], [address] [place], [place] [address]    Culture >=100,000 COLONIES/mL ENTEROCOCCUS FAECALIS (A)  Final   Report Status 10/19/2017 FINAL  Final   Organism ID, Bacteria ENTEROCOCCUS FAECALIS (A)  Final      Susceptibility   Enterococcus faecalis - MIC*    AMPICILLIN <=2 SENSITIVE Sensitive  LEVOFLOXACIN 2 SENSITIVE Sensitive     NITROFURANTOIN <=16 SENSITIVE Sensitive     VANCOMYCIN 2 SENSITIVE Sensitive     * >=100,000 COLONIES/mL ENTEROCOCCUS FAECALIS         Radiology Studies: No results found.      Scheduled Meds: . apixaban  5 mg Oral BID  . carvedilol  50 mg Oral BID WC  . cholecalciferol  2,000 Units Oral Daily  . colestipol  1 g Oral Daily  . folic acid  1 mg Oral Daily  . furosemide  60 mg Intravenous BID  . gabapentin  300 mg Oral QHS  . mouth rinse  15 mL Mouth Rinse BID  . potassium chloride SA  40 mEq Oral Daily  . predniSONE  10 mg Oral Q breakfast  . sodium chloride flush  3 mL Intravenous Q12H  . sodium chloride flush  3 mL Intravenous Q12H   Continuous Infusions: . sodium chloride    . amiodarone 30 mg/hr (10/19/17 0905)     LOS: 2 days    Time spent: 25 minutes    Alberteen Sam, MD [facility] 10/19/2017, 3:04 PM     Pager [phone] --- please page though AMION:  [email or web address] Password [email or web address] If 7PM-7AM, please contact  night-coverage

## 2017-10-20 LAB — BASIC METABOLIC PANEL
Anion gap: 9 (ref 5–15)
BUN: 14 mg/dL (ref 6–20)
CHLORIDE: 99 mmol/L — AB (ref 101–111)
CO2: 30 mmol/L (ref 22–32)
Calcium: 8.5 mg/dL — ABNORMAL LOW (ref 8.9–10.3)
Creatinine, Ser: 1.12 mg/dL — ABNORMAL HIGH (ref 0.44–1.00)
GFR calc Af Amer: 53 mL/min — ABNORMAL LOW (ref >60)
GFR calc non Af Amer: 46 mL/min — ABNORMAL LOW (ref >60)
GLUCOSE: 112 mg/dL — AB (ref 65–99)
Potassium: 3.3 mmol/L — ABNORMAL LOW (ref 3.5–5.1)
SODIUM: 138 mmol/L (ref 135–145)

## 2017-10-20 MED ORDER — SODIUM CHLORIDE 0.9% FLUSH: 3.0000 mL | Freq: Two times a day (BID) | INTRAVENOUS | Status: DC

## 2017-10-20 MED ORDER — FUROSEMIDE 10 MG/ML IJ SOLN
80.0000 mg | Freq: Two times a day (BID) | INTRAMUSCULAR | Status: DC
  Administered 2017-10-20 – 2017-10-21 (×2): 80 mg via INTRAVENOUS
  Filled 2017-10-20 (×2): qty 8

## 2017-10-20 MED ORDER — POTASSIUM CHLORIDE CRYS ER 20 MEQ PO TBCR
40.0000 meq | EXTENDED_RELEASE_TABLET | Freq: Two times a day (BID) | ORAL | Status: DC
  Administered 2017-10-20 – 2017-10-21 (×2): 40 meq via ORAL
  Filled 2017-10-20 (×2): qty 2

## 2017-10-20 MED ORDER — SODIUM CHLORIDE 0.9 % IV SOLN: 250.0000 mL | INTRAVENOUS | Status: DC

## 2017-10-20 MED ORDER — LORAZEPAM 1 MG PO TABS
1.0000 mg | ORAL_TABLET | Freq: Every evening | ORAL | Status: DC | PRN
  Administered 2017-10-21 – 2017-10-22 (×2): 1 mg via ORAL
  Filled 2017-10-20 (×3): qty 1

## 2017-10-20 MED ORDER — METHOTREXATE 2.5 MG PO TABS
15.0000 mg | ORAL_TABLET | ORAL | Status: DC
  Filled 2017-10-20: qty 6

## 2017-10-20 MED ORDER — SODIUM CHLORIDE 0.9% FLUSH: 3.0000 mL | INTRAVENOUS | Status: DC | PRN

## 2017-10-20 NOTE — Evaluation (Signed)
Physical Therapy Evaluation Patient Details Name: [name] [name] MRN: [id number] DOB: 10/26/1940 Today's Date: 10/20/2017   History of Present Illness  Mrs. [name] HampshireSpencer is a 77 y.o. F with inflammatory arthritis on methotrexate, prednisone and Norco, hypertension, CHF EF 45%, and atrial fibrillation on Eliquis who presented to the emergency department with exertional dyspnea and palpitations, found to be back in Afib with RVR.  Has had two previous cardioversions.   Clinical Impression  Pt admitted with above diagnosis. Pt currently with functional limitations due to the deficits listed below (see PT Problem List). Pt limited by HR to 147 bpm with movements.  Only able to stand x 2 and exercise as HR in afib and DOE 3/4 with minimal activity. Will follow.  Pt will benefit from skilled PT to increase their independence and safety with mobility to allow discharge to the venue listed below.      Follow Up Recommendations Home health PT;Supervision/Assistance - 24 hour    Equipment Recommendations  None recommended by PT    Recommendations for Other Services       Precautions / Restrictions Precautions Precautions: Fall Restrictions Weight Bearing Restrictions: No      Mobility  Bed Mobility Overal bed mobility: Independent             General bed mobility comments: Pt gets winded with transitions and needs rest breaks to recover  Transfers Overall transfer level: Needs assistance Equipment used: Rolling walker (2 wheeled) Transfers: Sit to/from Stand Sit to Stand: Min guard         General transfer comment: no assist needed  Ambulation/Gait                Stairs            Wheelchair Mobility    Modified Rankin (Stroke Patients Only)       Balance Overall balance assessment: Needs assistance Sitting-balance support: No upper extremity supported;Feet supported Sitting balance-Leahy Scale: Good     Standing balance support: Bilateral upper extremity  supported;During functional activity Standing balance-Leahy Scale: Poor Standing balance comment: relies on UE support                             Pertinent Vitals/Pain Pain Assessment: Faces Faces Pain Scale: Hurts even more Pain Location: right knee Pain Descriptors / Indicators: Aching;Grimacing;Guarding Pain Intervention(s): Limited activity within patient's tolerance;Monitored during session;Repositioned    Home Living Family/patient expects to be discharged to:: Private residence Living Arrangements: Spouse/significant other Available Help at Discharge: Family;Available 24 hours/day Type of Home: House Home Access: Ramped entrance     Home Layout: One level Home Equipment: Walker - 4 wheels;Cane - single point;Electric scooter;Shower seat;Adaptive equipment(home O2, pulse ox)      Prior Function Level of Independence: Independent with assistive device(s)         Comments: used scooter outdoors, rollator indoors     Hand Dominance        Extremity/Trunk Assessment   Upper Extremity Assessment Upper Extremity Assessment: Defer to OT evaluation    Lower Extremity Assessment Lower Extremity Assessment: Generalized weakness    Cervical / Trunk Assessment Cervical / Trunk Assessment: Normal  Communication   Communication: No difficulties  Cognition Arousal/Alertness: Awake/alert Behavior During Therapy: WFL for tasks assessed/performed Overall Cognitive Status: Within Functional Limits for tasks assessed  General Comments      Exercises General Exercises - Lower Extremity Ankle Circles/Pumps: AROM;Both;5 reps;Seated Long Arc Quad: AROM;Both;10 reps;Seated Hip Flexion/Marching: AROM;Both;10 reps;Standing   Assessment/Plan    PT Assessment Patient needs continued PT services  PT Problem List Decreased strength;Decreased activity tolerance;Decreased balance;Decreased mobility;Decreased  knowledge of use of DME;Decreased safety awareness;Decreased knowledge of precautions;Cardiopulmonary status limiting activity;Pain;Obesity       PT Treatment Interventions DME instruction;Gait training;Functional mobility training;Therapeutic activities;Therapeutic exercise;Balance training;Patient/family education    PT Goals (Current goals can be found in the Care Plan section)  Acute Rehab PT Goals Patient Stated Goal: to go home PT Goal Formulation: With patient Time For Goal Achievement: 11/03/17 Potential to Achieve Goals: Good    Frequency Min 3X/week   Barriers to discharge        Co-evaluation               AM-PAC PT "6 Clicks" Daily Activity  Outcome Measure Difficulty turning over in bed (including adjusting bedclothes, sheets and blankets)?: None Difficulty moving from lying on back to sitting on the side of the bed? : A Little Difficulty sitting down on and standing up from a chair with arms (e.g., wheelchair, bedside commode, etc,.)?: A Little Help needed moving to and from a bed to chair (including a wheelchair)?: A Little Help needed walking in hospital room?: Total Help needed climbing 3-5 steps with a railing? : Total 6 Click Score: 15    End of Session Equipment Utilized During Treatment: Gait belt;Oxygen Activity Tolerance: Patient limited by fatigue;Patient limited by pain Patient left: in bed;with call bell/phone within reach;with family/visitor present(on EOB) Nurse Communication: Mobility status PT Visit Diagnosis: Unsteadiness on feet (R26.81);Muscle weakness (generalized) (M62.81);Pain Pain - Right/Left: Right Pain - part of body: Knee    Time: 1610-9604 PT Time Calculation (min) (ACUTE ONLY): 28 min   Charges:   PT Evaluation $PT Eval Moderate Complexity: 1 Mod PT Treatments $Therapeutic Exercise: 8-22 mins   PT G Codes:        Yakir Wenke,PT Acute Rehabilitation [phone] (pager)   Berline Lopes 10/20/2017,  4:35 PM

## 2017-10-20 NOTE — Care Management Important Message (Signed)
Important Message  Patient Details  Name: [name] [name] MRN: [id number] Date of Birth: 10/10/1940   Medicare Important Message Given:  Yes    Oralia RudMegan P Mattheo Swindle 10/20/2017, 3:36 PM

## 2017-10-20 NOTE — Progress Notes (Signed)
PROGRESS NOTE    [name] RocheBetty [name]  RUE:[id number] DOB: 04/09/1941 DOA: 10/17/2017 PCP: Leanora Ivanoffellinger, Robert C. Jr., MD      Brief Narrative:  [name] [name] is a 77 y.o. F with inflammatory arthritis on methotrexate, prednisone and Norco, hypertension, CHF EF 45%, and atrial fibrillation on Eliquis who presented to the emergency department with exertional dyspnea and palpitations, found to be back in Afib with RVR.   Assessment & Plan:  Atrial fibrillation with RVR Chads 2 vascular 5.  Heart rate still elevated, likely stretch from CHF. -Continue Eliquis -Continue amiodarone, will discuss switching oral amiodarone per cardiology -Cardioversion on Monday -Continue beta-blocker   Chronic systolic CHF EF 45%.  No previous history of CHF (was evaluated in Aug 2018 for weight gain, EF 50-55% at that time, did not appear congested to [facility]).  More swollen again today. -Lasix 80 IV BID -K supplement -Strict I/Os, daily weights, telemetry  -Daily monitoring renal function  Inflammatory arthritis -Continue methotrexate, folate -Continue prednisone  Normocytic anemia From chronic disease.  No change in Hgb.  UTI Dysuria plus urine culture with enterococcus. -Continue amoxicillin, 5 days        DVT prophylaxis: Eliquis Code Status: Full code Family Communication: Sister at bedside MDM and disposition Plan: The below labs and imaging reports were reviewed and summarized above.     Patient was admitted with atrial fibrillation with RVR, having recently failed cardioversion.  She was loaded with IV amiodarone, and planning inpatient cardioversion here in the next few days.  In the meantime she is having progressive congestive heart failure, for which we are again augmenting diuresis, monitoring her ins and outs closely and her renal function daily.     Consultants:   Cardiology    Subjective: Some orthopnea overnight, worse dyspnea, leg swelling is persistent, abdominal  distention is worse.  No chest pain, confusion, fever, sputum.  Objective: Vitals:   10/20/17 0441 10/20/17 0500 10/20/17 0810 10/20/17 1204  BP: 109/81  (!) 111/97 113/86  Pulse: 92  (!) 124 (!) 141  Resp: 17  18 20   Temp: (!) 97.5 F (36.4 C)  98.1 F (36.7 C) 98.2 F (36.8 C)  TempSrc: Oral  Oral Oral  SpO2: 98%   97%  Weight:  134.3 kg (296 lb)    Height:        Intake/Output Summary (Last 24 hours) at 10/20/2017 1509 Last data filed at 10/20/2017 1400 Gross per 24 hour  Intake 1663.78 ml  Output 2100 ml  Net -436.22 ml   Filed Weights   10/18/17 0402 10/19/17 0500 10/20/17 0500  Weight: 132 kg (291 lb) 133.3 kg (293 lb 12.8 oz) 134.3 kg (296 lb)    Examination: General appearance: Obese adult female, sitting in the bed of the bed again, conversational. HEENT: Anicteric, conjunctive are pink, lids and lashes normal.  No nasal deformity, discharge, or epistaxis.  Lips moist, teeth normal, OP moist, no oral lesions. Skin: Warm and dry, no suspicious rashes, chronic venous stasis changes to the bilateral lower extremities. Cardiac: Cardiac, irregular, no murmurs appreciated.  JVP not visible due to habitus.  3+ lower pitting. Respiratory: Respiratory effort labored.  No wheezes.  Rales bases bilaterally. Abdomen: Abdomen soft, no tenderness to palpation,.   MSK: No deformities or effusions in the large joints of the upper or lower extremities bilaterally. Neuro: Awake and alert, moves all extremities equally, speech fluent. Psych: Sensorium intact and responding to questions, attention normal, affect normal, judgment and insight normal.  Data Reviewed: I have personally reviewed following labs and imaging studies:  CBC: Recent Labs  Lab 10/17/17 0251 10/18/17 0431  WBC 8.5 7.8  HGB 11.8* 11.4*  HCT 36.6 36.3  MCV 98.4 101.1*  PLT 274 239   Basic Metabolic Panel: Recent Labs  Lab 10/17/17 0251 10/18/17 0431 10/19/17 0346 10/20/17 0543  NA 137 137 138 138  K  3.7 3.5 3.3* 3.3*  CL 97* 100* 100* 99*  CO2 29 28 29 30   GLUCOSE 126* 111* 114* 112*  BUN 17 16 16 14   CREATININE 1.09* 1.12* 1.06* 1.12*  CALCIUM 8.9 8.4* 8.3* 8.5*  MG 2.0  --   --   --    GFR: Estimated Creatinine Clearance: 56.5 mL/min (A) (by C-G formula based on SCr of 1.12 mg/dL (H)). Liver Function Tests: No results for input(s): AST, ALT, ALKPHOS, BILITOT, PROT, ALBUMIN in the last 168 hours. No results for input(s): LIPASE, AMYLASE in the last 168 hours. No results for input(s): AMMONIA in the last 168 hours. Coagulation Profile: No results for input(s): INR, PROTIME in the last 168 hours. Cardiac Enzymes: No results for input(s): CKTOTAL, CKMB, CKMBINDEX, TROPONINI in the last 168 hours. BNP (last 3 results) No results for input(s): PROBNP in the last 8760 hours. HbA1C: No results for input(s): HGBA1C in the last 72 hours. CBG: No results for input(s): GLUCAP in the last 168 hours. Lipid Profile: No results for input(s): CHOL, HDL, LDLCALC, TRIG, CHOLHDL, LDLDIRECT in the last 72 hours. Thyroid Function Tests: No results for input(s): TSH, T4TOTAL, FREET4, T3FREE, THYROIDAB in the last 72 hours. Anemia Panel: No results for input(s): VITAMINB12, FOLATE, FERRITIN, TIBC, IRON, RETICCTPCT in the last 72 hours. Urine analysis:    Component Value Date/Time   COLORURINE STRAW (A) 10/17/2017 1616   APPEARANCEUR CLEAR 10/17/2017 1616   LABSPEC 1.005 10/17/2017 1616   PHURINE 7.0 10/17/2017 1616   GLUCOSEU NEGATIVE 10/17/2017 1616   HGBUR NEGATIVE 10/17/2017 1616   BILIRUBINUR NEGATIVE 10/17/2017 1616   KETONESUR NEGATIVE 10/17/2017 1616   PROTEINUR NEGATIVE 10/17/2017 1616   UROBILINOGEN 0.2 10/27/2006 1337   NITRITE NEGATIVE 10/17/2017 1616   LEUKOCYTESUR SMALL (A) 10/17/2017 1616   Sepsis Labs: @LABRCNTIP (procalcitonin:4,lacticacidven:4)  ) Recent Results (from the past 240 hour(s))  Culture, Urine     Status: Abnormal   Collection Time: 10/17/17  6:01 PM    Result Value Ref Range Status   Specimen Description URINE, RANDOM  Final   Special Requests   Final    NONE Performed at [facility], [address] [place], [place] [address]    Culture >=100,000 COLONIES/mL ENTEROCOCCUS FAECALIS (A)  Final   Report Status 10/19/2017 FINAL  Final   Organism ID, Bacteria ENTEROCOCCUS FAECALIS (A)  Final      Susceptibility   Enterococcus faecalis - MIC*    AMPICILLIN <=2 SENSITIVE Sensitive     LEVOFLOXACIN 2 SENSITIVE Sensitive     NITROFURANTOIN <=16 SENSITIVE Sensitive     VANCOMYCIN 2 SENSITIVE Sensitive     * >=100,000 COLONIES/mL ENTEROCOCCUS FAECALIS         Radiology Studies: No results found.      Scheduled Meds: . amoxicillin  500 mg Oral Q12H  . apixaban  5 mg Oral BID  . carvedilol  50 mg Oral BID WC  . cholecalciferol  2,000 Units Oral Daily  . colestipol  1 g Oral Daily  . folic acid  1 mg Oral Daily  . furosemide  80  mg Intravenous BID  . gabapentin  300 mg Oral QHS  . mouth rinse  15 mL Mouth Rinse BID  . methotrexate  15 mg Oral Weekly  . potassium chloride SA  40 mEq Oral Daily  . predniSONE  10 mg Oral Q breakfast  . sodium chloride flush  3 mL Intravenous Q12H  . sodium chloride flush  3 mL Intravenous Q12H  . sodium chloride flush  3 mL Intravenous Q12H   Continuous Infusions: . sodium chloride    . [START ON 10/23/2017] sodium chloride    . amiodarone 30 mg/hr (10/20/17 0836)     LOS: 3 days    Time spent: 25 minutes    Alberteen Sam, MD [facility] 10/20/2017, 3:09 PM     Pager [phone] --- please page though AMION:  [email or web address] Password [email or web address] If 7PM-7AM, please contact night-coverage

## 2017-10-20 NOTE — Progress Notes (Addendum)
Progress Note  Patient Name: [name] [name] Date of Encounter: 10/20/2017  Primary Cardiologist: Dr. Tomie China  Subjective   Pt continues to feel "full" with low UO. No chest pain of SOB.   Inpatient Medications    Scheduled Meds: . amoxicillin  500 mg Oral Q12H  . apixaban  5 mg Oral BID  . carvedilol  50 mg Oral BID WC  . cholecalciferol  2,000 Units Oral Daily  . colestipol  1 g Oral Daily  . folic acid  1 mg Oral Daily  . furosemide  60 mg Intravenous BID  . gabapentin  300 mg Oral QHS  . mouth rinse  15 mL Mouth Rinse BID  . potassium chloride SA  40 mEq Oral Daily  . predniSONE  10 mg Oral Q breakfast  . sodium chloride flush  3 mL Intravenous Q12H  . sodium chloride flush  3 mL Intravenous Q12H   Continuous Infusions: . sodium chloride    . amiodarone 30 mg/hr (10/19/17 2000)   PRN Meds: sodium chloride, acetaminophen **OR** acetaminophen, alum & mag hydroxide-simeth, bisacodyl, HYDROcodone-acetaminophen, ondansetron **OR** ondansetron (ZOFRAN) IV, senna-docusate, sodium chloride flush   Vital Signs    Vitals:   10/19/17 2010 10/20/17 0052 10/20/17 0441 10/20/17 0500  BP: 102/73 (!) 126/93 109/81   Pulse: (!) 118 (!) 121 92   Resp: 18 16 17    Temp: (!) 97.5 F (36.4 C) 97.6 F (36.4 C) (!) 97.5 F (36.4 C)   TempSrc: Oral Oral Oral   SpO2: 99% 98% 98%   Weight:    296 lb (134.3 kg)  Height:        Intake/Output Summary (Last 24 hours) at 10/20/2017 0737 Last data filed at 10/20/2017 0600 Gross per 24 hour  Intake 1843.78 ml  Output 1500 ml  Net 343.78 ml   Filed Weights   10/18/17 0402 10/19/17 0500 10/20/17 0500  Weight: 291 lb (132 kg) 293 lb 12.8 oz (133.3 kg) 296 lb (134.3 kg)    Physical Exam   General: Obese,  NAD Skin: Warm, dry, intact  Head: Normocephalic, atraumatic, clear, moist mucus membranes. Neck: Negative for carotid bruits. No JVD Lungs:Bilateral LL crackles. No wheezes. Breathing is unlabored. Cardiovascular: Irregularly  irregular with S1 S2. No murmurs or rubs, gallops Abdomen: Firm, non-tender, non-distended with normoactive bowel sounds.  No obvious abdominal masses. MSK: Strength and tone appear normal for age. 5/5 in all extremities Extremities: BLE 3+edema. No clubbing or cyanosis. DP/PT pulses 1+ bilaterally Neuro: Alert and oriented. No focal deficits. No facial asymmetry. MAE spontaneously. Psych: Responds to questions appropriately with normal affect.    Labs    Chemistry Recent Labs  Lab 10/18/17 0431 10/19/17 0346 10/20/17 0543  NA 137 138 138  K 3.5 3.3* 3.3*  CL 100* 100* 99*  CO2 28 29 30   GLUCOSE 111* 114* 112*  BUN 16 16 14   CREATININE 1.12* 1.06* 1.12*  CALCIUM 8.4* 8.3* 8.5*  GFRNONAA 46* 49* 46*  GFRAA 53* 57* 53*  ANIONGAP 9 9 9      Hematology Recent Labs  Lab 10/17/17 0251 10/18/17 0431  WBC 8.5 7.8  RBC 3.72* 3.59*  HGB 11.8* 11.4*  HCT 36.6 36.3  MCV 98.4 101.1*  MCH 31.7 31.8  MCHC 32.2 31.4  RDW 14.0 14.2  PLT 274 239    Cardiac EnzymesNo results for input(s): TROPONINI in the last 168 hours.  Recent Labs  Lab 10/17/17 0257  TROPIPOC 0.02     BNP  Recent Labs  Lab 10/17/17 0251  BNP 606.4*     DDimer No results for input(s): DDIMER in the last 168 hours.   Radiology    No results found.  Telemetry    Atrial fibrillation HR 101 - Personally Reviewed  ECG    No new tracings as of 10/20/2017- Personally Reviewed  Cardiac Studies   ECHO: TEE cardioversion 10/11/2017: Study Conclusions  - Left ventricle: No evidence of thrombus. - Mitral valve: There was moderate to severe regurgitation. - Left atrium: No evidence of thrombus in the atrial cavity or appendage. - Tricuspid valve: There was mild-moderate regurgitation.  Impressions:  - Successful cardioversion. No cardiac source of emboli was indentified.  Echocardiogram 10/08/2017: Study Conclusions  - Left ventricle: The cavity size was normal. Wall thickness  was increased in a pattern of mild LVH. Systolic function was mildly reduced. The estimated ejection fraction was in the range of 45% to 50%. Diffuse hypokinesis. The study is not technically sufficient to allow evaluation of LV diastolic function. Ejection fraction (MOD, 2-plane): 47%. - Aortic valve: Trileaflet. Sclerosis without stenosis. There was no regurgitation. - Mitral valve: Mildly thickened leaflets . There was moderate regurgitation. - Left atrium: The atrium was normal in size. - Inferior vena cava: The vessel was dilated. The respirophasic diameter changes were blunted (<50%), consistent with elevated central venous pressure.  Impressions:  - LVEF 45-50%, mild LVH, global hypokinesis, moderate MR, normal LA size, dilated IVC.  Patient Profile     [age] y.o. female with a hx of HTN, chronic systolic heart failure, recent recurrence atrial fibrillation on Eliquis (intial dx 4-5 years ago) and polymyalgia who is being seen by Cardiology for the evaluation of atrial fibrillation.  Assessment & Plan    1. Atrial fibrillation with RVR: -Rates continue to be variable, maintaining in the low 100-110s at baseline -Continue amiodaronegttfor loading. Plan for DCCV on 10/23/17 -Continue Eliquis, carvedilol increased to 50 mg twice daily on 10/18/2017 -BP stable, 111/97>109/81>126/93 -CHA2DS2VASc =5 (agex2, sex, CHF, hypertension)  2. Acute on chronic systolic heart failure: -Per echocardiogram 10/08/2017 with LVEF 45 to 50% with diffuse hypokinesis, moderate MR, normal LA size and dilated IVC -[id number]-Weight,296lbtoday, 290lbon admission.  Weight continues to increase despite IV diuretics she is up a total of 6 pounds, 3 pounds from yesterday -I&O, net  -580 mL since admission, total 24-hour output  1.5 L -Creatinine, 1.12, slightly  up from yesterday at 1. 06 -Will increase IV Lasix to 80 mg twice daily and monitor for improvement IV -Continues to have LL  crackles and BLE edema  3. HTN: -Stable, 111/97>109/81>126/93 -Educated on calling for assistance for ambulation -Carvedilol 50, losartan stopped 10/18/2017  4. Polymyalgia: -Denies pain, on chronic methotrexate, prednisone  5. UTI: -She reports a long history of chronic UTI. -UA on admission positive for hyaline casts,antibiotics per primary team  Signed, Georgie ChardJill McDaniel NP-C [facility] Pager: [phone] 10/20/2017, 7:37 AM    For questions or updates, please contact   Please consult [email or web address] for contact info under Cardiology/STEMI.  Attending Note:   The patient was seen and examined.  Agree with assessment and plan as noted above.  Changes made to the above note as needed.  Patient seen and independently examined with Georgie ChardJill McDaniel, NP .   We discussed all aspects of the encounter. I agree with the assessment and plan as stated above.  1.  Atrial fib : It is still difficult to control her ventricular response.  She is been on amiodarone.  She is scheduled for a cardioversion on Monday.  2.  Acute on chronic combined CHF:   She has 3+ pitting edema in her legs.  I suspect that she is third spacing a lot of the fluid.  I have advised her to elevate her legs through the weekend.  We will be increasing her Lasix.   I have spent a total of 40 minutes with patient reviewing hospital  notes , telemetry, EKGs, labs and examining patient as well as establishing an assessment and plan that was discussed with the patient. > 50% of time was spent in direct patient care.    [name], Montez Hageman., MD, [facility] 10/20/2017, 9:00 AM [address],  [address] Office [phone] Pager [phone]

## 2017-10-21 LAB — BASIC METABOLIC PANEL
Anion gap: 12 (ref 5–15)
BUN: 13 mg/dL (ref 6–20)
CALCIUM: 8.5 mg/dL — AB (ref 8.9–10.3)
CO2: 31 mmol/L (ref 22–32)
Chloride: 97 mmol/L — ABNORMAL LOW (ref 101–111)
Creatinine, Ser: 1.13 mg/dL — ABNORMAL HIGH (ref 0.44–1.00)
GFR calc Af Amer: 53 mL/min — ABNORMAL LOW (ref >60)
GFR, EST NON AFRICAN AMERICAN: 45 mL/min — AB (ref >60)
GLUCOSE: 113 mg/dL — AB (ref 65–99)
Potassium: 3.4 mmol/L — ABNORMAL LOW (ref 3.5–5.1)
Sodium: 140 mmol/L (ref 135–145)

## 2017-10-21 MED ORDER — FUROSEMIDE 10 MG/ML IJ SOLN
120.0000 mg | Freq: Two times a day (BID) | INTRAVENOUS | Status: DC
  Administered 2017-10-21 – 2017-10-23 (×4): 120 mg via INTRAVENOUS
  Filled 2017-10-21 (×3): qty 12
  Filled 2017-10-21: qty 10
  Filled 2017-10-21: qty 2

## 2017-10-21 MED ORDER — CARVEDILOL 25 MG PO TABS
25.0000 mg | ORAL_TABLET | Freq: Two times a day (BID) | ORAL | Status: DC
  Administered 2017-10-21 – 2017-10-26 (×10): 25 mg via ORAL
  Filled 2017-10-21 (×10): qty 1

## 2017-10-21 MED ORDER — POTASSIUM CHLORIDE CRYS ER 20 MEQ PO TBCR
40.0000 meq | EXTENDED_RELEASE_TABLET | Freq: Four times a day (QID) | ORAL | Status: AC
  Administered 2017-10-21 – 2017-10-22 (×3): 40 meq via ORAL
  Filled 2017-10-21 (×3): qty 2

## 2017-10-21 MED ORDER — AMIODARONE HCL 200 MG PO TABS
400.0000 mg | ORAL_TABLET | Freq: Two times a day (BID) | ORAL | Status: DC
  Administered 2017-10-21 – 2017-10-26 (×11): 400 mg via ORAL
  Filled 2017-10-21 (×11): qty 2

## 2017-10-21 NOTE — Plan of Care (Signed)
Converted to NSR with PAC's. Amio gtt changed to PO. IV lasix increased.

## 2017-10-21 NOTE — Progress Notes (Signed)
Progress Note  Patient Name: [name] RocheBetty [name] Date of Encounter: 10/21/2017  Primary Cardiologist: Dr. Tomie Chinaevankar Patient Profile     77 y.o. female with a hx of HTN, chronic systolic heart failure, recent recurrence atrial fibrillation on Eliquis (intial dx 4-5 years ago) underwent TEE cardioversion 5/19 atrial fibrillation has been associated with shortness of breath Has a history of polymyalgia rheumatica 5/19 TTE EF 47% normal LA size  Subjective  Low BP and woozy following conversion to sinus rhythm.  Blood pressure is better.  Inpatient Medications    Scheduled Meds: . amoxicillin  500 mg Oral Q12H  . apixaban  5 mg Oral BID  . carvedilol  50 mg Oral BID WC  . cholecalciferol  2,000 Units Oral Daily  . colestipol  1 g Oral Daily  . folic acid  1 mg Oral Daily  . furosemide  80 mg Intravenous BID  . gabapentin  300 mg Oral QHS  . mouth rinse  15 mL Mouth Rinse BID  . methotrexate  15 mg Oral Weekly  . potassium chloride SA  40 mEq Oral BID  . predniSONE  10 mg Oral Q breakfast  . sodium chloride flush  3 mL Intravenous Q12H   Continuous Infusions: . amiodarone 30 mg/hr (10/21/17 0801)   PRN Meds: acetaminophen **OR** acetaminophen, alum & mag hydroxide-simeth, bisacodyl, HYDROcodone-acetaminophen, LORazepam, ondansetron **OR** ondansetron (ZOFRAN) IV, senna-docusate   Vital Signs    Vitals:   10/21/17 1124 10/21/17 1138 10/21/17 1139 10/21/17 1142  BP: (!) 86/60  101/81 98/72  Pulse: 79  73 66  Resp:      Temp:  97.7 F (36.5 C)    TempSrc:  Oral    SpO2: 96%  97% 99%  Weight:      Height:        Intake/Output Summary (Last 24 hours) at 10/21/2017 1203 Last data filed at 10/21/2017 1100 Gross per 24 hour  Intake 1160 ml  Output 2000 ml  Net -840 ml   Filed Weights   10/19/17 0500 10/20/17 0500 10/21/17 0456  Weight: 293 lb 12.8 oz (133.3 kg) 296 lb (134.3 kg) 296 lb 4.8 oz (134.4 kg)  I's and O's net negative about 2.5 L since admission not  withstanding weight up 6 pounds???  Physical Exam   Well developed and nourished in no acute distress HENT normal Neck supple with JVP-8 Clear Regular rate and rhythm, no murmurs or gallops Abd-soft with active BS No Clubbing cyanosis swelling but also involving the fore foot B  Skin-warm and dry A & Oriented  Grossly normal sensory and motor function   Labs    Chemistry Recent Labs  Lab 10/19/17 0346 10/20/17 0543 10/21/17 0814  NA 138 138 140  K 3.3* 3.3* 3.4*  CL 100* 99* 97*  CO2 29 30 31   GLUCOSE 114* 112* 113*  BUN 16 14 13   CREATININE 1.06* 1.12* 1.13*  CALCIUM 8.3* 8.5* 8.5*  GFRNONAA 49* 46* 45*  GFRAA 57* 53* 53*  ANIONGAP 9 9 12      Hematology Recent Labs  Lab 10/17/17 0251 10/18/17 0431  WBC 8.5 7.8  RBC 3.72* 3.59*  HGB 11.8* 11.4*  HCT 36.6 36.3  MCV 98.4 101.1*  MCH 31.7 31.8  MCHC 32.2 31.4  RDW 14.0 14.2  PLT 274 239    Cardiac EnzymesNo results for input(s): TROPONINI in the last 168 hours.  Recent Labs  Lab 10/17/17 0257  TROPIPOC 0.02     BNP Recent  Labs  Lab 10/17/17 0251  BNP 606.4*     DDimer No results for input(s): DDIMER in the last 168 hours.   Radiology    No results found.  Telemetry    Atrial fibrillation HR 101 - Personally Reviewed  ECG    No new tracings as of 10/20/2017- Personally Reviewed  Cardiac Studies   ECHO: TEE cardioversion 10/11/2017: Study Conclusions  - Left ventricle: No evidence of thrombus. - Mitral valve: There was moderate to severe regurgitation. - Left atrium: No evidence of thrombus in the atrial cavity or appendage. - Tricuspid valve: There was mild-moderate regurgitation.  Impressions:  - Successful cardioversion. No cardiac source of emboli was indentified.  Echocardiogram 10/08/2017: Study Conclusions  - Left ventricle: The cavity size was normal. Wall thickness was increased in a pattern of mild LVH. Systolic function was mildly reduced. The  estimated ejection fraction was in the range of 45% to 50%. Diffuse hypokinesis. The study is not technically sufficient to allow evaluation of LV diastolic function. Ejection fraction (MOD, 2-plane): 47%. - Aortic valve: Trileaflet. Sclerosis without stenosis. There was no regurgitation. - Mitral valve: Mildly thickened leaflets . There was moderate regurgitation. - Left atrium: The atrium was normal in size. - Inferior vena cava: The vessel was dilated. The respirophasic diameter changes were blunted (<50%), consistent with elevated central venous pressure.  Impressions:  - LVEF 45-50%, mild LVH, global hypokinesis, moderate MR, normal LA size, dilated IVC.    Assessment & Plan    1. Atrial fibrillation with RVR:    2. Acute on chronic diastolic heart failure:    3. HTN:   4. Polymyalgia:   Hypokalemia  Lymphedema    She has converted to sinus rhythm.  We will change her to oral amiodarone.  Part of her swelling is lymphedema as manifested by a swelling on the forefoot.  However, she does have evidence of volume overload and will continue to diurese her.  She may benefit from wrapping of her lower extremities.  Potassium is low; will replete.  Blood pressure stable.  Hopefully her cardiomyopathy will improve with restoration and maintenance of sinus rhythm with a controlled ventricular rate  We will decrease her carvedilol and hopefully tomorrow be able to initiate an ARB given her left ventricular dysfunction

## 2017-10-21 NOTE — Progress Notes (Signed)
PROGRESS NOTE    Cala Kruckenberg  RUE:[id number] DOB: [birth year] DOA: 10/17/2017 PCP: Leanora Ivanoff., MD      Brief Narrative:  Mrs. Leeth is a 77 y.o. F with inflammatory arthritis on methotrexate, prednisone and Norco, hypertension, CHF EF 45%, and atrial fibrillation on Eliquis who presented to the emergency department with exertional dyspnea and palpitations, found to be back in Afib with RVR.   Assessment & Plan:  Atrial fibrillation with RVR Chads 2Vasc 5.  Still tachycardic. -Continue Eliquis -Continue amiodarone, per cardiology -Plan for cardioversion on Monday -Continue beta-blocker   Chronic systolic CHF EF 45%.  No previous history of CHF (was evaluated in Aug 2018 for weight gain, EF 50-55% at that time, did not appear congested to [facility]).  Dramatically better today.  Net -1.5 L yesterday.  Weight however even. -Continue IV Lasix -K supplement -Strict I/Os, daily weights, telemetry  -Daily monitoring renal function  Inflammatory arthritis Continue methotrexate, folate -Continue prednisone  Normocytic anemia From chronic disease.  Hemoglobin stable.  UTI Dysuria plus urine culture with enterococcus. -Continue amoxicillin day 3 of 5        DVT prophylaxis: Eliquis Code Status: Full code Family Communication: Husband at the bedside MDM and disposition Plan: The labs and imaging reports were reviewed and summarized above.  Patient was admitted with atrial fibrillation with RVR, having recently took cardioversion, she was loaded with IV amiodarone, with a plan for inpatient cardioversion here over the weekend.  In the meantime she has been noticed to have congestive heart failure, so we are augmenting her diuresis, monitoring ins and outs and renal function closely.        Consultants:   Cardiology    Subjective: Nausea overnight, no vomiting.  Her orthopnea is improved, her abdominal fullness is better.  No chest pain,  palpitations, confusion, fever.  Dysuria has resolved.  Objective: Vitals:   10/21/17 1139 10/21/17 1142 10/21/17 1210 10/21/17 1307  BP: 101/81 98/72 102/67   Pulse: 73 66 68   Resp:      Temp:      TempSrc:      SpO2: 97% 99% 95% (!) 87%  Weight:      Height:        Intake/Output Summary (Last 24 hours) at 10/21/2017 1529 Last data filed at 10/21/2017 1100 Gross per 24 hour  Intake 960 ml  Output 2000 ml  Net -1040 ml   Filed Weights   10/19/17 0500 10/20/17 0500 10/21/17 0456  Weight: 133.3 kg (293 lb 12.8 oz) 134.3 kg (296 lb) 134.4 kg (296 lb 4.8 oz)    Examination: General appearance: Obese female, sitting on bedside commode, conversational. HEENT: Anicteric, conjunctival pink, lids and lashes normal.  No nasal discharge, deformity, or epistaxis.  Teeth normal, lips normal.  Oropharynx moist, no oral lesions. Skin: Warm and dry, no suspicious rashes.  She has chronic venous stasis changes of the legs. Cardiac: Irregular, tachycardic, no murmurs.  Lower extremity edema is improved from yesterday. Respiratory: Normal respiratory effort.  No wheezes.  No rales. Abdomen: Abdomen soft without tenderness to palpation.  No HSM appreciated. MSK: No deformities or effusions in the large joints of the upper or lower extremities bilaterally. Neuro: Alert, moves all extremities equally, speech fluent. Psych: Sensorium intact and responding to questions, attention normal, affect normal, judgment and insight normal.  Data Reviewed: I have personally reviewed following labs and imaging studies:  CBC: Recent Labs  Lab 10/17/17 0251 10/18/17 0431  WBC  8.5 7.8  HGB 11.8* 11.4*  HCT 36.6 36.3  MCV 98.4 101.1*  PLT 274 239   Basic Metabolic Panel: Recent Labs  Lab 10/17/17 0251 10/18/17 0431 10/19/17 0346 10/20/17 0543 10/21/17 0814  NA 137 137 138 138 140  K 3.7 3.5 3.3* 3.3* 3.4*  CL 97* 100* 100* 99* 97*  CO2 29 28 29 30 31   GLUCOSE 126* 111* 114* 112* 113*  BUN 17 16  16 14 13   CREATININE 1.09* 1.12* 1.06* 1.12* 1.13*  CALCIUM 8.9 8.4* 8.3* 8.5* 8.5*  MG 2.0  --   --   --   --    GFR: Estimated Creatinine Clearance: 56.1 mL/min (A) (by C-G formula based on SCr of 1.13 mg/dL (H)). Liver Function Tests: No results for input(s): AST, ALT, ALKPHOS, BILITOT, PROT, ALBUMIN in the last 168 hours. No results for input(s): LIPASE, AMYLASE in the last 168 hours. No results for input(s): AMMONIA in the last 168 hours. Coagulation Profile: No results for input(s): INR, PROTIME in the last 168 hours. Cardiac Enzymes: No results for input(s): CKTOTAL, CKMB, CKMBINDEX, TROPONINI in the last 168 hours. BNP (last 3 results) No results for input(s): PROBNP in the last 8760 hours. HbA1C: No results for input(s): HGBA1C in the last 72 hours. CBG: No results for input(s): GLUCAP in the last 168 hours. Lipid Profile: No results for input(s): CHOL, HDL, LDLCALC, TRIG, CHOLHDL, LDLDIRECT in the last 72 hours. Thyroid Function Tests: No results for input(s): TSH, T4TOTAL, FREET4, T3FREE, THYROIDAB in the last 72 hours. Anemia Panel: No results for input(s): VITAMINB12, FOLATE, FERRITIN, TIBC, IRON, RETICCTPCT in the last 72 hours. Urine analysis:    Component Value Date/Time   COLORURINE STRAW (A) 10/17/2017 1616   APPEARANCEUR CLEAR 10/17/2017 1616   LABSPEC 1.005 10/17/2017 1616   PHURINE 7.0 10/17/2017 1616   GLUCOSEU NEGATIVE 10/17/2017 1616   HGBUR NEGATIVE 10/17/2017 1616   BILIRUBINUR NEGATIVE 10/17/2017 1616   KETONESUR NEGATIVE 10/17/2017 1616   PROTEINUR NEGATIVE 10/17/2017 1616   UROBILINOGEN 0.2 10/27/2006 1337   NITRITE NEGATIVE 10/17/2017 1616   LEUKOCYTESUR SMALL (A) 10/17/2017 1616   Sepsis Labs: @LABRCNTIP (procalcitonin:4,lacticacidven:4)  ) Recent Results (from the past 240 hour(s))  Culture, Urine     Status: Abnormal   Collection Time: 10/17/17  6:01 PM  Result Value Ref Range Status   Specimen Description URINE, RANDOM  Final    Special Requests   Final    NONE Performed at [facility], [address] [place], [place] [address]    Culture >=100,000 COLONIES/mL ENTEROCOCCUS FAECALIS (A)  Final   Report Status 10/19/2017 FINAL  Final   Organism ID, Bacteria ENTEROCOCCUS FAECALIS (A)  Final      Susceptibility   Enterococcus faecalis - MIC*    AMPICILLIN <=2 SENSITIVE Sensitive     LEVOFLOXACIN 2 SENSITIVE Sensitive     NITROFURANTOIN <=16 SENSITIVE Sensitive     VANCOMYCIN 2 SENSITIVE Sensitive     * >=100,000 COLONIES/mL ENTEROCOCCUS FAECALIS         Radiology Studies: No results found.      Scheduled Meds: . amiodarone  400 mg Oral BID  . amoxicillin  500 mg Oral Q12H  . apixaban  5 mg Oral BID  . carvedilol  25 mg Oral BID WC  . cholecalciferol  2,000 Units Oral Daily  . colestipol  1 g Oral Daily  . folic acid  1 mg Oral Daily  . gabapentin  300 mg Oral QHS  .  mouth rinse  15 mL Mouth Rinse BID  . methotrexate  15 mg Oral Weekly  . potassium chloride SA  40 mEq Oral Q6H  . predniSONE  10 mg Oral Q breakfast  . sodium chloride flush  3 mL Intravenous Q12H   Continuous Infusions: . furosemide       LOS: 4 days    Time spent: 25 minutes    Alberteen Sam, MD [facility] 10/21/2017, 3:29 PM     Pager [phone] --- please page though AMION:  [email or web address] If 7PM-7AM, please contact night-coverage

## 2017-10-21 NOTE — Progress Notes (Signed)
C/O feeling strange and weak. Sitting on side of bed. [facility] Noted on cardiac monitor to be in SR in the 60's with occ. PAC's. BP 87/75 rechecked manual = 86/60. C/O nausea. Requests maalox. Dr. Graciela HusbandsKlein notifed of BP and will see pt.

## 2017-10-22 LAB — BASIC METABOLIC PANEL
ANION GAP: 11 (ref 5–15)
BUN: 15 mg/dL (ref 6–20)
CHLORIDE: 100 mmol/L — AB (ref 101–111)
CO2: 27 mmol/L (ref 22–32)
Calcium: 8.7 mg/dL — ABNORMAL LOW (ref 8.9–10.3)
Creatinine, Ser: 1.26 mg/dL — ABNORMAL HIGH (ref 0.44–1.00)
GFR calc non Af Amer: 40 mL/min — ABNORMAL LOW (ref >60)
GFR, EST AFRICAN AMERICAN: 46 mL/min — AB (ref >60)
GLUCOSE: 146 mg/dL — AB (ref 65–99)
POTASSIUM: 4.8 mmol/L (ref 3.5–5.1)
Sodium: 138 mmol/L (ref 135–145)

## 2017-10-22 MED ORDER — IPRATROPIUM-ALBUTEROL 0.5-2.5 (3) MG/3ML IN SOLN
3.0000 mL | RESPIRATORY_TRACT | Status: DC | PRN
  Administered 2017-10-22 (×2): 3 mL via RESPIRATORY_TRACT
  Filled 2017-10-22 (×2): qty 3

## 2017-10-22 MED ORDER — POTASSIUM CHLORIDE CRYS ER 20 MEQ PO TBCR
40.0000 meq | EXTENDED_RELEASE_TABLET | Freq: Every day | ORAL | Status: DC
  Administered 2017-10-23: 40 meq via ORAL
  Filled 2017-10-22 (×2): qty 2

## 2017-10-22 MED ORDER — METOLAZONE 5 MG PO TABS
2.5000 mg | ORAL_TABLET | Freq: Once | ORAL | Status: AC
  Administered 2017-10-22: 2.5 mg via ORAL
  Filled 2017-10-22: qty 1

## 2017-10-22 NOTE — Progress Notes (Signed)
Pt C/O being SOB and wheezy but has been anxious all night, assessment unchanged from previous but pt is a little more diminished in her lung fields than previous, will text page [facility] and continue to monitor.

## 2017-10-22 NOTE — Progress Notes (Signed)
Pt was ordered a breathing treatment and gave, pt now feels much better and lung sounds are a little cleared. Pt is resting at this time with no S/S of distress, will continue to monitor.

## 2017-10-22 NOTE — Plan of Care (Signed)
UNNA boots placed bilateral today. Zaroxyln added. U/O increased.

## 2017-10-22 NOTE — Progress Notes (Signed)
Progress Note  Patient Name: [name] RocheBetty Smalls Date of Encounter: 10/22/2017  Primary Cardiologist: Dr. Tomie Chinaevankar Patient Profile     77 y.o. female with a hx of HTN, chronic systolic heart failure, recent recurrence atrial fibrillation on Eliquis (intial dx 4-5 years ago) underwent TEE cardioversion 5/19 atrial fibrillation has been associated with shortness of breath Has a history of polymyalgia rheumatica 5/19 TTE EF 47% normal LA size  Subjective  Sob  Diuretics had little impact yday Neb Rx this am with little impact  Inpatient Medications    Scheduled Meds: . amiodarone  400 mg Oral BID  . amoxicillin  500 mg Oral Q12H  . apixaban  5 mg Oral BID  . carvedilol  25 mg Oral BID WC  . cholecalciferol  2,000 Units Oral Daily  . colestipol  1 g Oral Daily  . folic acid  1 mg Oral Daily  . gabapentin  300 mg Oral QHS  . mouth rinse  15 mL Mouth Rinse BID  . methotrexate  15 mg Oral Weekly  . potassium chloride SA  40 mEq Oral Q6H  . predniSONE  10 mg Oral Q breakfast  . sodium chloride flush  3 mL Intravenous Q12H   Continuous Infusions: . furosemide Stopped (10/21/17 1847)   PRN Meds: acetaminophen **OR** acetaminophen, alum & mag hydroxide-simeth, bisacodyl, HYDROcodone-acetaminophen, ipratropium-albuterol, LORazepam, ondansetron **OR** ondansetron (ZOFRAN) IV, senna-docusate   Vital Signs    Vitals:   10/21/17 2004 10/21/17 2011 10/22/17 0415 10/22/17 0747  BP: (!) 89/70  111/66 114/73  Pulse: 66  66 64  Resp: 18  18 19   Temp:  97.9 F (36.6 C) 98 F (36.7 C) 98.1 F (36.7 C)  TempSrc:  Oral Oral Oral  SpO2: 96%  96% 94%  Weight:   296 lb (134.3 kg)   Height:        Intake/Output Summary (Last 24 hours) at 10/22/2017 0814 Last data filed at 10/22/2017 0415 Gross per 24 hour  Intake 840 ml  Output 1150 ml  Net -310 ml   Filed Weights   10/20/17 0500 10/21/17 0456 10/22/17 0415  Weight: 296 lb (134.3 kg) 296 lb 4.8 oz (134.4 kg) 296 lb (134.3 kg)  I's  and O's net negative about 2.5 L since admission not withstanding weight up 6 pounds???  Physical Exam   Well developed and nourished in no acute distress HENT normal Neck supple with JVP-unable todiscern Decreased breath sounds with wheezing  Regular rate and rhythm, no murmurs or gallops Abd-soft with active BS without hepatomegaly No Clubbing cyanosis ++ edema Skin-warm and dry A & Oriented  Grossly normal sensory and motor function   Labs    Chemistry Recent Labs  Lab 10/20/17 0543 10/21/17 0814 10/22/17 0354  NA 138 140 138  K 3.3* 3.4* 4.8  CL 99* 97* 100*  CO2 30 31 27   GLUCOSE 112* 113* 146*  BUN 14 13 15   CREATININE 1.12* 1.13* 1.26*  CALCIUM 8.5* 8.5* 8.7*  GFRNONAA 46* 45* 40*  GFRAA 53* 53* 46*  ANIONGAP 9 12 11      Hematology Recent Labs  Lab 10/17/17 0251 10/18/17 0431  WBC 8.5 7.8  RBC 3.72* 3.59*  HGB 11.8* 11.4*  HCT 36.6 36.3  MCV 98.4 101.1*  MCH 31.7 31.8  MCHC 32.2 31.4  RDW 14.0 14.2  PLT 274 239    Cardiac EnzymesNo results for input(s): TROPONINI in the last 168 hours.  Recent Labs  Lab 10/17/17 0257  TROPIPOC 0.02     BNP Recent Labs  Lab 10/17/17 0251  BNP 606.4*     DDimer No results for input(s): DDIMER in the last 168 hours.   Radiology    No results found.  Telemetry    Atrial fibrillation HR 101 - Personally Reviewed  ECG    No new tracings as of 10/20/2017- Personally Reviewed  Cardiac Studies   ECHO: TEE cardioversion 10/11/2017: Study Conclusions  - Left ventricle: No evidence of thrombus. - Mitral valve: There was moderate to severe regurgitation. - Left atrium: No evidence of thrombus in the atrial cavity or appendage. - Tricuspid valve: There was mild-moderate regurgitation.  Impressions:  - Successful cardioversion. No cardiac source of emboli was indentified.  Echocardiogram 10/08/2017: Study Conclusions  - Left ventricle: The cavity size was normal. Wall thickness  was increased in a pattern of mild LVH. Systolic function was mildly reduced. The estimated ejection fraction was in the range of 45% to 50%. Diffuse hypokinesis. The study is not technically sufficient to allow evaluation of LV diastolic function. Ejection fraction (MOD, 2-plane): 47%. - Aortic valve: Trileaflet. Sclerosis without stenosis. There was no regurgitation. - Mitral valve: Mildly thickened leaflets . There was moderate regurgitation. - Left atrium: The atrium was normal in size. - Inferior vena cava: The vessel was dilated. The respirophasic diameter changes were blunted (<50%), consistent with elevated central venous pressure.  Impressions:  - LVEF 45-50%, mild LVH, global hypokinesis, moderate MR, normal LA size, dilated IVC.    Assessment & Plan    1. Atrial fibrillation with RVR:    2. Acute on chronic diastolic heart failure:    3. HTN:   4. Polymyalgia:   Hypokalemia  Lymphedema    Acutely lSOB  Not responding to nebs Will give IV lasix and get BNP  Although her weight and age will make interpretation difficult  Holding sinus on amio  BP better  Will hold off on ARB today as going to diurese  K stable

## 2017-10-22 NOTE — Progress Notes (Signed)
Orthopedic Tech Progress Note Patient Details:  [name] RocheBetty [name] 09/18/1940 [id number]  Ortho Devices Type of Ortho Device: Radio broadcast assistantUnna boot Ortho Device/Splint Location: bi Ortho Device/Splint Interventions: Ordered, Application, Adjustment   Post Interventions Patient Tolerated: Well Instructions Provided: Care of device, Adjustment of device   Trinna PostMartinez, Mitesh Rosendahl J 10/22/2017, 8:22 AM

## 2017-10-22 NOTE — Progress Notes (Signed)
Central telemetry called about pt having a 7 beat run of V-Tach, strip has a lot of artifact but pt is asymptomatic with VSS, will continue to monitor.

## 2017-10-22 NOTE — Progress Notes (Signed)
PROGRESS NOTE    Chia Mowers  ZOX:[id number] DOB: Mar 19, 1941 DOA: 10/17/2017 PCP: Leanora Ivanoff., MD      Brief Narrative:  [name] [name] is a 77 y.o. F with inflammatory arthritis on methotrexate, prednisone and Norco, hypertension, CHF EF 45%, and atrial fibrillation on Eliquis who presented to the emergency department with exertional dyspnea and palpitations, found to be back in Afib with RVR.   Assessment & Plan:  Atrial fibrillation with RVR History of ask 5.  On Eliquis.  Converted to sinus rhythm yesterday morning. -Continue Eliquis -Continue amiodarone - Continue beta-blocker   Chronic systolic CHF EF 45%.  No previous history of CHF (was evaluated in Aug 2018 for weight gain, EF 50-55% at that time, did not appear congested to [facility]). Dyspneic overnight, bad orthopnea. Net neg only a few hundred mL yesteray?  Weight poor response, despite escalating doses of lasix.    -Agree with increasing lasix and adding metolazone -She has made 2L UOP since adding metolazone -Continue K daily -Strict I/Os, daily weights, telemetry  -Daily monitoring renal function  Inflammatory arthritis -Continue methotrexate, folate, prednisone   Normocytic anemia From chronic disease.  Hgb no change.  UTI Dysuria plus urine culture with enterococcus. -Continue amoxicilline day 4 of 5        DVT prophylaxis: Eliquis Code Status: Full code Family Communication: Husband at the bedside MDM and disposition Plan: The labs and imaging reports were reviewed and summarized above.  Patient was admitted with atrial fibrillation with RVR, and recently failed a cardioversion and was loaded with IV amiodarone with plans for patient cardioversion this week.  However yesterday she cardioverted spontaneously.  In the meantime she is developed severe congestive heart failure, for which we are increasing her diuretics each day.  She is yet to respond to escalating doses of Lasix.   Likely admission for another 4 days.      Consultants:   Cardiology    Subjective: Dyspneic overnight, not very much relieved with nebulized bronchodilators.  Her weight is essentially even from yesterday, she reports severe orthopnea, paroxysmal nocturnal dyspnea, abdominal fullness.  No fever, cough, sputum.  No wheezing.  Objective: Vitals:   10/21/17 2004 10/21/17 2011 10/22/17 0415 10/22/17 0747  BP: (!) 89/70  111/66 114/73  Pulse: 66  66 64  Resp: 18  18 19   Temp:  97.9 F (36.6 C) 98 F (36.7 C) 98.1 F (36.7 C)  TempSrc:  Oral Oral Oral  SpO2: 96%  96% 94%  Weight:   134.3 kg (296 lb)   Height:        Intake/Output Summary (Last 24 hours) at 10/22/2017 1452 Last data filed at 10/22/2017 1400 Gross per 24 hour  Intake 782 ml  Output 3150 ml  Net -2368 ml   Filed Weights   10/20/17 0500 10/21/17 0456 10/22/17 0415  Weight: 134.3 kg (296 lb) 134.4 kg (296 lb 4.8 oz) 134.3 kg (296 lb)    Examination: General appearance: Morbidly obese female, alert and in mild respiratory distress.  Sitting on the edge of the bed.  Reactive. HEENT: Anicteric, conjunctiva pink, lids and lashes normal. No nasal deformity, discharge, epistaxis.  Lips moist, teeth normal. OP moist, no oral lesions.   Skin: Warm and dry.  No suspicious rashes or lesions except for chronic venous stasis changes to both legs. Cardiac: Regular rate and rhythm, murmurs, pitting lower extremity edema, JVP not visible.    Respiratory: Increased respiratory effort, rales bilaterally increased from yesterday.  MSK: No deformities or effusions of the large joints of the upper or lower extremities bilaterally. Neuro: Awake and alert. Naming is grossly intact, and the patient's recall, recent and remote, as well as general fund of knowledge seem within normal limits.  Muscle tone normal, without fasciculations.  Moves all extremities equally and with normal coordination.  Marland Kitchen. Speech fluent.    Psych: Sensorium intact  and responding to questions, attention normal. Affect blunted by dyspnea.  Judgment and insight appear normal.       Data Reviewed: I have personally reviewed following labs and imaging studies:  CBC: Recent Labs  Lab 10/17/17 0251 10/18/17 0431  WBC 8.5 7.8  HGB 11.8* 11.4*  HCT 36.6 36.3  MCV 98.4 101.1*  PLT 274 239   Basic Metabolic Panel: Recent Labs  Lab 10/17/17 0251 10/18/17 0431 10/19/17 0346 10/20/17 0543 10/21/17 0814 10/22/17 0354  NA 137 137 138 138 140 138  K 3.7 3.5 3.3* 3.3* 3.4* 4.8  CL 97* 100* 100* 99* 97* 100*  CO2 29 28 29 30 31 27   GLUCOSE 126* 111* 114* 112* 113* 146*  BUN 17 16 16 14 13 15   CREATININE 1.09* 1.12* 1.06* 1.12* 1.13* 1.26*  CALCIUM 8.9 8.4* 8.3* 8.5* 8.5* 8.7*  MG 2.0  --   --   --   --   --    GFR: Estimated Creatinine Clearance: 50.2 mL/min (A) (by C-G formula based on SCr of 1.26 mg/dL (H)). Liver Function Tests: No results for input(s): AST, ALT, ALKPHOS, BILITOT, PROT, ALBUMIN in the last 168 hours. No results for input(s): LIPASE, AMYLASE in the last 168 hours. No results for input(s): AMMONIA in the last 168 hours. Coagulation Profile: No results for input(s): INR, PROTIME in the last 168 hours. Cardiac Enzymes: No results for input(s): CKTOTAL, CKMB, CKMBINDEX, TROPONINI in the last 168 hours. BNP (last 3 results) No results for input(s): PROBNP in the last 8760 hours. HbA1C: No results for input(s): HGBA1C in the last 72 hours. CBG: No results for input(s): GLUCAP in the last 168 hours. Lipid Profile: No results for input(s): CHOL, HDL, LDLCALC, TRIG, CHOLHDL, LDLDIRECT in the last 72 hours. Thyroid Function Tests: No results for input(s): TSH, T4TOTAL, FREET4, T3FREE, THYROIDAB in the last 72 hours. Anemia Panel: No results for input(s): VITAMINB12, FOLATE, FERRITIN, TIBC, IRON, RETICCTPCT in the last 72 hours. Urine analysis:    Component Value Date/Time   COLORURINE STRAW (A) 10/17/2017 1616    APPEARANCEUR CLEAR 10/17/2017 1616   LABSPEC 1.005 10/17/2017 1616   PHURINE 7.0 10/17/2017 1616   GLUCOSEU NEGATIVE 10/17/2017 1616   HGBUR NEGATIVE 10/17/2017 1616   BILIRUBINUR NEGATIVE 10/17/2017 1616   KETONESUR NEGATIVE 10/17/2017 1616   PROTEINUR NEGATIVE 10/17/2017 1616   UROBILINOGEN 0.2 10/27/2006 1337   NITRITE NEGATIVE 10/17/2017 1616   LEUKOCYTESUR SMALL (A) 10/17/2017 1616   Sepsis Labs: @LABRCNTIP (procalcitonin:4,lacticacidven:4)  ) Recent Results (from the past 240 hour(s))  Culture, Urine     Status: Abnormal   Collection Time: 10/17/17  6:01 PM  Result Value Ref Range Status   Specimen Description URINE, RANDOM  Final   Special Requests   Final    NONE Performed at [facility], [address] [place], [place] [address]    Culture >=100,000 COLONIES/mL ENTEROCOCCUS FAECALIS (A)  Final   Report Status 10/19/2017 FINAL  Final   Organism ID, Bacteria ENTEROCOCCUS FAECALIS (A)  Final      Susceptibility   Enterococcus faecalis - MIC*  AMPICILLIN <=2 SENSITIVE Sensitive     LEVOFLOXACIN 2 SENSITIVE Sensitive     NITROFURANTOIN <=16 SENSITIVE Sensitive     VANCOMYCIN 2 SENSITIVE Sensitive     * >=100,000 COLONIES/mL ENTEROCOCCUS FAECALIS         Radiology Studies: No results found.      Scheduled Meds: . amiodarone  400 mg Oral BID  . amoxicillin  500 mg Oral Q12H  . apixaban  5 mg Oral BID  . carvedilol  25 mg Oral BID WC  . cholecalciferol  2,000 Units Oral Daily  . colestipol  1 g Oral Daily  . folic acid  1 mg Oral Daily  . gabapentin  300 mg Oral QHS  . mouth rinse  15 mL Mouth Rinse BID  . methotrexate  15 mg Oral Weekly  . potassium chloride SA  40 mEq Oral Q6H  . [START ON 10/23/2017] potassium chloride SA  40 mEq Oral Daily  . predniSONE  10 mg Oral Q breakfast  . sodium chloride flush  3 mL Intravenous Q12H   Continuous Infusions: . furosemide Stopped (10/22/17 1049)     LOS: 5 days    Time spent: 25  minutes    Alberteen Sam, MD [facility] 10/22/2017, 2:52 PM     Pager [phone] --- please page though AMION:  [email or web address] Password [email or web address] If 7PM-7AM, please contact night-coverage

## 2017-10-23 ENCOUNTER — Encounter (HOSPITAL_COMMUNITY)
Admission: EM | Disposition: A | Discharge status: Home Health | Payer: Self-pay | Source: Home / Self Care | Attending: Family Medicine

## 2017-10-23 LAB — MAGNESIUM: MAGNESIUM: 2.2 mg/dL (ref 1.7–2.4)

## 2017-10-23 LAB — BASIC METABOLIC PANEL
ANION GAP: 13 (ref 5–15)
BUN: 15 mg/dL (ref 6–20)
CO2: 36 mmol/L — ABNORMAL HIGH (ref 22–32)
Calcium: 8.6 mg/dL — ABNORMAL LOW (ref 8.9–10.3)
Chloride: 91 mmol/L — ABNORMAL LOW (ref 101–111)
Creatinine, Ser: 1.24 mg/dL — ABNORMAL HIGH (ref 0.44–1.00)
GFR, EST AFRICAN AMERICAN: 47 mL/min — AB (ref >60)
GFR, EST NON AFRICAN AMERICAN: 41 mL/min — AB (ref >60)
GLUCOSE: 101 mg/dL — AB (ref 65–99)
POTASSIUM: 2.5 mmol/L — AB (ref 3.5–5.1)
Sodium: 140 mmol/L (ref 135–145)

## 2017-10-23 SURGERY — CARDIOVERSION
Anesthesia: General

## 2017-10-23 MED ORDER — POTASSIUM CHLORIDE CRYS ER 20 MEQ PO TBCR
40.0000 meq | EXTENDED_RELEASE_TABLET | Freq: Two times a day (BID) | ORAL | Status: DC
  Administered 2017-10-23 – 2017-10-25 (×5): 40 meq via ORAL
  Filled 2017-10-23 (×6): qty 2

## 2017-10-23 MED ORDER — POTASSIUM CHLORIDE 10 MEQ/100ML IV SOLN: 10.0000 meq | INTRAVENOUS | Status: DC

## 2017-10-23 MED ORDER — POTASSIUM CHLORIDE 10 MEQ/100ML IV SOLN
10.0000 meq | INTRAVENOUS | Status: AC
  Administered 2017-10-23 (×2): 10 meq via INTRAVENOUS
  Filled 2017-10-23 (×2): qty 100

## 2017-10-23 MED ORDER — FUROSEMIDE 10 MG/ML IJ SOLN
80.0000 mg | Freq: Two times a day (BID) | INTRAMUSCULAR | Status: DC
  Administered 2017-10-23: 80 mg via INTRAVENOUS
  Filled 2017-10-23 (×2): qty 8

## 2017-10-23 MED ORDER — MAGNESIUM SULFATE IN D5W 1-5 GM/100ML-% IV SOLN
1.0000 g | Freq: Once | INTRAVENOUS | Status: AC
  Administered 2017-10-23: 1 g via INTRAVENOUS
  Filled 2017-10-23: qty 100

## 2017-10-23 NOTE — Progress Notes (Signed)
Text paged [facility] with a potassium level of 2.5 this am, awaiting orders for replacement, VSS and pt is sleeping at this time.

## 2017-10-23 NOTE — Progress Notes (Signed)
PROGRESS NOTE    [name] [name]  ZOX:[id number] DOB: 1941/03/07 DOA: 10/17/2017 PCP: Leanora Ivanoff., MD      Brief Narrative:  [name] [name] is a 77 y.o. F with inflammatory arthritis on methotrexate, prednisone and Norco, hypertension, CHF EF 45%, and atrial fibrillation on Eliquis who presented to the emergency department with exertional dyspnea and palpitations, found to be back in Afib with RVR.   Assessment & Plan:    Acute on chronic systolic CHF EF 45%.  No previous history of CHF (was evaluated in Aug 2018 for weight gain, EF 50-55% at that time, did not appear congested to [facility]).-5L yesterday with Zaroxolyn.  Weight down.  Dry weight 288 kg.  Cr Stable today  -Decrease Lasix to 80 BID -Continue K BID -Strict I/Os, daily weights, telemetry  -Daily monitoring renal function  Hypokalemia Got IV K this AM. -Check mag -Continue K  Atrial fibrillation with RVR CHADS2Vasc 5.  On Eliquis.  Converted to sinus rhythm 6/8. -Continue Eliquis -Continue amiodarone PO -Continue BB  Inflammatory arthritis -Continue methotrexate, folate -Continue prednisone    Normocytic anemia From chronic disease.  UTI Dysuria plus urine culture with enterococcus. -Continue amoxicillin, day 5 of 5        DVT prophylaxis: Eliquis Code Status: Full code Family Communication: Husband at the bedside MDM and disposition Plan: Below labs and imaging reports were reviewed and summarized above  The patient was admitted with atrial fibrillation with RVR, failed recent cardioversion last hospitalization, loaded with IV amiodarone with plans for inpatient cardioversion.  Chemical cardioversion with amiodarone worked over the weekend, she is now in sinus rhythm.  However in the meantime she developed severe congestive heart failure, quite escalating doses of Lasix for diuresis, but is now responded.  We will diurese additional 1 to 2 days, likely discharge Wednesday or Thursday,  PT recommend [facility] rehab.        Consultants:   Cardiology    Subjective: Her dyspnea is substantially improved.  Her orthopnea is better than yesterday.  She has lost weight, she feels less fullness in her stomach, swelling in her legs.  No new fever, cough, sputum, wheezing.     Objective: Vitals:   10/23/17 0011 10/23/17 0307 10/23/17 0307 10/23/17 0744  BP: 104/61 126/89 126/89 114/89  Pulse: 64  65 (!) 58  Resp: 20 20  19   Temp:  97.6 F (36.4 C) 97.6 F (36.4 C) 98 F (36.7 C)  TempSrc:  Axillary Oral Oral  SpO2:  94% 94% 98%  Weight:  132.1 kg (291 lb 4.8 oz)    Height:        Intake/Output Summary (Last 24 hours) at 10/23/2017 1107 Last data filed at 10/23/2017 0900 Gross per 24 hour  Intake 1072 ml  Output 6350 ml  Net -5278 ml   Filed Weights   10/21/17 0456 10/22/17 0415 10/23/17 0307  Weight: 134.4 kg (296 lb 4.8 oz) 134.3 kg (296 lb) 132.1 kg (291 lb 4.8 oz)    Examination: General appearance: Obese adult female, sitting on edge of bed, no acute distress. HEENT: Anicteric, conjunctival pink, lids and lashes normal.  No nasal deformity, discharge, epistaxis.  Lips moist, teeth normal.  Hearing normal. Skin: Skin warm and dry, no suspicious rashes lesions.  Legs in Unna boots. Cardiac: Heart rate regular, pitting lower extremity edema to mid shin, JVP not visible.    Respiratory: Gwyndolyn Kaufman effort normal, rales at bases, improved from yesterday. MSK: No deformities or effusions  of the large joints of the upper or lower extremities bilaterally. Neuro: Awake and alert, naming is grossly intact, and the patient's recall, recent and remote as well as general fund of knowledge seen within normal limits.  Muscle tone is normal without fasciculations.  Moves all extremities equally and with normal coordination.  Speech fluent.    Psych: Interim intact and responding to questions, attention normal, affect normal, judgment normal.       Data Reviewed: I have  personally reviewed following labs and imaging studies:  CBC: Recent Labs  Lab 10/17/17 0251 10/18/17 0431  WBC 8.5 7.8  HGB 11.8* 11.4*  HCT 36.6 36.3  MCV 98.4 101.1*  PLT 274 239   Basic Metabolic Panel: Recent Labs  Lab 10/17/17 0251  10/19/17 0346 10/20/17 0543 10/21/17 0814 10/22/17 0354 10/23/17 0440  NA 137   < > 138 138 140 138 140  K 3.7   < > 3.3* 3.3* 3.4* 4.8 2.5*  CL 97*   < > 100* 99* 97* 100* 91*  CO2 29   < > 29 30 31 27  36*  GLUCOSE 126*   < > 114* 112* 113* 146* 101*  BUN 17   < > 16 14 13 15 15   CREATININE 1.09*   < > 1.06* 1.12* 1.13* 1.26* 1.24*  CALCIUM 8.9   < > 8.3* 8.5* 8.5* 8.7* 8.6*  MG 2.0  --   --   --   --   --   --    < > = values in this interval not displayed.   GFR: Estimated Creatinine Clearance: 50.6 mL/min (A) (by C-G formula based on SCr of 1.24 mg/dL (H)). Liver Function Tests: No results for input(s): AST, ALT, ALKPHOS, BILITOT, PROT, ALBUMIN in the last 168 hours. No results for input(s): LIPASE, AMYLASE in the last 168 hours. No results for input(s): AMMONIA in the last 168 hours. Coagulation Profile: No results for input(s): INR, PROTIME in the last 168 hours. Cardiac Enzymes: No results for input(s): CKTOTAL, CKMB, CKMBINDEX, TROPONINI in the last 168 hours. BNP (last 3 results) No results for input(s): PROBNP in the last 8760 hours. HbA1C: No results for input(s): HGBA1C in the last 72 hours. CBG: No results for input(s): GLUCAP in the last 168 hours. Lipid Profile: No results for input(s): CHOL, HDL, LDLCALC, TRIG, CHOLHDL, LDLDIRECT in the last 72 hours. Thyroid Function Tests: No results for input(s): TSH, T4TOTAL, FREET4, T3FREE, THYROIDAB in the last 72 hours. Anemia Panel: No results for input(s): VITAMINB12, FOLATE, FERRITIN, TIBC, IRON, RETICCTPCT in the last 72 hours. Urine analysis:    Component Value Date/Time   COLORURINE STRAW (A) 10/17/2017 1616   APPEARANCEUR CLEAR 10/17/2017 1616   LABSPEC 1.005  10/17/2017 1616   PHURINE 7.0 10/17/2017 1616   GLUCOSEU NEGATIVE 10/17/2017 1616   HGBUR NEGATIVE 10/17/2017 1616   BILIRUBINUR NEGATIVE 10/17/2017 1616   KETONESUR NEGATIVE 10/17/2017 1616   PROTEINUR NEGATIVE 10/17/2017 1616   UROBILINOGEN 0.2 10/27/2006 1337   NITRITE NEGATIVE 10/17/2017 1616   LEUKOCYTESUR SMALL (A) 10/17/2017 1616   Sepsis Labs: @LABRCNTIP (procalcitonin:4,lacticacidven:4)  ) Recent Results (from the past 240 hour(s))  Culture, Urine     Status: Abnormal   Collection Time: 10/17/17  6:01 PM  Result Value Ref Range Status   Specimen Description URINE, RANDOM  Final   Special Requests   Final    NONE Performed at [facility], [address] [place], [place] [address]    Culture >=100,000  COLONIES/mL ENTEROCOCCUS FAECALIS (A)  Final   Report Status 10/19/2017 FINAL  Final   Organism ID, Bacteria ENTEROCOCCUS FAECALIS (A)  Final      Susceptibility   Enterococcus faecalis - MIC*    AMPICILLIN <=2 SENSITIVE Sensitive     LEVOFLOXACIN 2 SENSITIVE Sensitive     NITROFURANTOIN <=16 SENSITIVE Sensitive     VANCOMYCIN 2 SENSITIVE Sensitive     * >=100,000 COLONIES/mL ENTEROCOCCUS FAECALIS         Radiology Studies: No results found.      Scheduled Meds: . amiodarone  400 mg Oral BID  . amoxicillin  500 mg Oral Q12H  . apixaban  5 mg Oral BID  . carvedilol  25 mg Oral BID WC  . cholecalciferol  2,000 Units Oral Daily  . colestipol  1 g Oral Daily  . folic acid  1 mg Oral Daily  . furosemide  80 mg Intravenous BID  . gabapentin  300 mg Oral QHS  . mouth rinse  15 mL Mouth Rinse BID  . methotrexate  15 mg Oral Weekly  . potassium chloride SA  40 mEq Oral BID  . predniSONE  10 mg Oral Q breakfast  . sodium chloride flush  3 mL Intravenous Q12H   Continuous Infusions:    LOS: 6 days    Time spent: 25 minuets    Alberteen Samhristopher P Danford, MD [facility] 10/23/2017, 11:07 AM     Pager [phone] --- please page though  AMION:  [email or web address] Password [email or web address] If 7PM-7AM, please contact night-coverage

## 2017-10-23 NOTE — Plan of Care (Signed)
Pt expresses understanding of ordered treatments for diuresis, compliant with use of call light to call staff for assistance, family at beside, call light within reach.  Raymon MuttonGwen Journee Kohen RN

## 2017-10-23 NOTE — Progress Notes (Signed)
Progress Note  Patient Name: [name] [name] Date of Encounter: 10/23/2017  Primary Cardiologist: No primary care provider on file.   Subjective   Patient reports feeling much improved. Breathing is back to normal. Still with some O2 requirement. Denies chest pain.  Inpatient Medications    Scheduled Meds: . amiodarone  400 mg Oral BID  . amoxicillin  500 mg Oral Q12H  . apixaban  5 mg Oral BID  . carvedilol  25 mg Oral BID WC  . cholecalciferol  2,000 Units Oral Daily  . colestipol  1 g Oral Daily  . folic acid  1 mg Oral Daily  . gabapentin  300 mg Oral QHS  . mouth rinse  15 mL Mouth Rinse BID  . methotrexate  15 mg Oral Weekly  . potassium chloride SA  40 mEq Oral Daily  . predniSONE  10 mg Oral Q breakfast  . sodium chloride flush  3 mL Intravenous Q12H   Continuous Infusions: . furosemide 120 mg (10/23/17 0813)   PRN Meds: acetaminophen **OR** acetaminophen, alum & mag hydroxide-simeth, bisacodyl, HYDROcodone-acetaminophen, ipratropium-albuterol, LORazepam, ondansetron **OR** ondansetron (ZOFRAN) IV, senna-docusate   Vital Signs    Vitals:   10/23/17 0011 10/23/17 0307 10/23/17 0307 10/23/17 0744  BP: 104/61 126/89 126/89 114/89  Pulse: 64  65 (!) 58  Resp: 20 20  19   Temp:  97.6 F (36.4 C) 97.6 F (36.4 C) 98 F (36.7 C)  TempSrc:  Axillary Oral Oral  SpO2:  94% 94% 98%  Weight:  291 lb 4.8 oz (132.1 kg)    Height:        Intake/Output Summary (Last 24 hours) at 10/23/2017 0851 Last data filed at 10/23/2017 0300 Gross per 24 hour  Intake 1072 ml  Output 6050 ml  Net -4978 ml   Filed Weights   10/21/17 0456 10/22/17 0415 10/23/17 0307  Weight: 296 lb 4.8 oz (134.4 kg) 296 lb (134.3 kg) 291 lb 4.8 oz (132.1 kg)    Telemetry    NSR - Personally Reviewed  Physical Exam   GEN: Sitting on the edge of her bed in no acute distress.   Neck: Difficult to assess JVD due to body habitus, no carotid bruits Cardiac: RRR, no murmurs, rubs, or gallops.    Respiratory: Clear to auscultation bilaterally, no wheezes/ rales/ rhonchi GI: NABS, obese, soft, nontender, non-distended  MS: LE wrapped with 2+ edema; No deformity. Neuro:  Nonfocal, moving all extremities spontaneously Psych: Normal affect   Labs    Chemistry Recent Labs  Lab 10/21/17 0814 10/22/17 0354 10/23/17 0440  NA 140 138 140  K 3.4* 4.8 2.5*  CL 97* 100* 91*  CO2 31 27 36*  GLUCOSE 113* 146* 101*  BUN 13 15 15   CREATININE 1.13* 1.26* 1.24*  CALCIUM 8.5* 8.7* 8.6*  GFRNONAA 45* 40* 41*  GFRAA 53* 46* 47*  ANIONGAP 12 11 13      Hematology Recent Labs  Lab 10/17/17 0251 10/18/17 0431  WBC 8.5 7.8  RBC 3.72* 3.59*  HGB 11.8* 11.4*  HCT 36.6 36.3  MCV 98.4 101.1*  MCH 31.7 31.8  MCHC 32.2 31.4  RDW 14.0 14.2  PLT 274 239    Cardiac EnzymesNo results for input(s): TROPONINI in the last 168 hours.  Recent Labs  Lab 10/17/17 0257  TROPIPOC 0.02     BNP Recent Labs  Lab 10/17/17 0251  BNP 606.4*     DDimer No results for input(s): DDIMER in the last 168 hours.  Radiology    No results found.  Cardiac Studies   Echocardiogram 10/08/17: Study Conclusions  - Left ventricle: The cavity size was normal. Wall thickness was   increased in a pattern of mild LVH. Systolic function was mildly   reduced. The estimated ejection fraction was in the range of 45%   to 50%. Diffuse hypokinesis. The study is not technically   sufficient to allow evaluation of LV diastolic function. Ejection   fraction (MOD, 2-plane): 47%. - Aortic valve: Trileaflet. Sclerosis without stenosis. There was   no regurgitation. - Mitral valve: Mildly thickened leaflets . There was moderate   regurgitation. - Left atrium: The atrium was normal in size. - Inferior vena cava: The vessel was dilated. The respirophasic   diameter changes were blunted (< 50%), consistent with elevated   central venous pressure.  Impressions:  - LVEF 45-50%, mild LVH, global  hypokinesis, moderate MR, normal LA   size, dilated IVC.  Patient Profile     77 y.o. female with PMH of HTN, chronic systolic CHF, persistent atrial fibrillation on Eliquis s/p DCCV 5/19, who presented with SOB and was found to be in Afib RVR.   Assessment & Plan    1. Atrial fibrillation with RVR: presented with HR in the 140s. Started on amiodarone and is maintaining NSR as of 10/21/17. - Continue eliquis for anticoagulation - Continue amiodarone for rhythm control - Continue BBlocker for rate control  2. Acute on chronic systolic CHF: She was started on aggressive diuresis with IV lasix. Metolazone given yesterday with significant UOP of net -4.9L in the last 24 hours and -7.3L this admission. Cr stable at 1.24 today. Weight 296>291lbs today.  - Continue aggressive diuresis - can de-escalate to 80mg  IV BID - Would hold off on additional metolazone dosing today given excellent response yesterday - Continue coreg; ARB on hold to allow BP room for diuresis  3. HTN: BP stable. Home ARB on hold to allow for diuresis - Continue coreg  4. Hypokalemia: K 2.5 this AM. Receiving 20mEq IV and 80mEq po potassium today. Likely in response to aggressive diuresis yesterday with the addition of metolazone - Continue to monitor closely and replete as needed; goal >4  For questions or updates, please contact [facility] Please consult [email or web address] for contact info under Cardiology/STEMI.      Signed, Beatriz StallionKrista M. Alphonsine Minium, PA-C  10/23/2017, 8:51 AM   [phone]

## 2017-10-24 LAB — BASIC METABOLIC PANEL
Anion gap: 10 (ref 5–15)
BUN: 14 mg/dL (ref 6–20)
CHLORIDE: 86 mmol/L — AB (ref 101–111)
CO2: 42 mmol/L — ABNORMAL HIGH (ref 22–32)
Calcium: 8.5 mg/dL — ABNORMAL LOW (ref 8.9–10.3)
Creatinine, Ser: 1.35 mg/dL — ABNORMAL HIGH (ref 0.44–1.00)
GFR calc Af Amer: 42 mL/min — ABNORMAL LOW (ref >60)
GFR calc non Af Amer: 37 mL/min — ABNORMAL LOW (ref >60)
Glucose, Bld: 100 mg/dL — ABNORMAL HIGH (ref 65–99)
POTASSIUM: 2.5 mmol/L — AB (ref 3.5–5.1)
SODIUM: 138 mmol/L (ref 135–145)

## 2017-10-24 MED ORDER — FUROSEMIDE 40 MG PO TABS: 40.0000 mg | ORAL_TABLET | Freq: Two times a day (BID) | ORAL | Status: DC

## 2017-10-24 MED ORDER — FUROSEMIDE 80 MG PO TABS
80.0000 mg | ORAL_TABLET | Freq: Two times a day (BID) | ORAL | Status: DC
  Administered 2017-10-24 – 2017-10-26 (×5): 80 mg via ORAL
  Filled 2017-10-24 (×5): qty 1

## 2017-10-24 MED ORDER — POTASSIUM CHLORIDE 10 MEQ/100ML IV SOLN
10.0000 meq | Freq: Once | INTRAVENOUS | Status: AC
  Administered 2017-10-24: 10 meq via INTRAVENOUS

## 2017-10-24 MED ORDER — POTASSIUM CHLORIDE 10 MEQ/100ML IV SOLN
10.0000 meq | INTRAVENOUS | Status: AC
  Administered 2017-10-24 (×3): 10 meq via INTRAVENOUS
  Filled 2017-10-24 (×5): qty 100

## 2017-10-24 NOTE — Progress Notes (Signed)
Physical Therapy Treatment Patient Details Name: [name] [name] MRN: [id number] DOB: 01/21/1941 Today's Date: 10/24/2017    History of Present Illness [name] [name] is a 77 y.o. F with inflammatory arthritis on methotrexate, prednisone and Norco, hypertension, CHF EF 45%, and atrial fibrillation on Eliquis who presented to the emergency department with exertional dyspnea and palpitations, found to be back in Afib with RVR.  Has had two previous cardioversions.     PT Comments    Pt admitted with above diagnosis. Pt currently with functional limitations due to the deficits listed below (see PT Problem List). Pt was able to ambulate with RW in room.  Appears to be at baseline.  Should be fine to go home with [facility] and [facility].   Pt will benefit from skilled PT to increase their independence and safety with mobility to allow discharge to the venue listed below.     Follow Up Recommendations  Home health PT;Supervision/Assistance - 24 hour     Equipment Recommendations  Other (comment)(Needs Bariatric 3N1)    Recommendations for Other Services       Precautions / Restrictions Precautions Precautions: Fall Restrictions Weight Bearing Restrictions: No    Mobility  Bed Mobility Overal bed mobility: Independent             General bed mobility comments: Pt gets winded with transitions and needs rest breaks to recover  Transfers Overall transfer level: Needs assistance Equipment used: Rolling walker (2 wheeled) Transfers: Sit to/from Stand Sit to Stand: Min guard         General transfer comment: no assist needed  Ambulation/Gait Ambulation/Gait assistance: Min guard Ambulation Distance (Feet): 35 Feet Assistive device: Rolling walker (2 wheeled) Gait Pattern/deviations: Step-through pattern;Decreased stride length;Drifts right/left;Wide base of support   Gait velocity interpretation: <1.31 ft/sec, indicative of household ambulator General Gait Details: Pt was able  to ambulate with RW in room.  Declined walking out of room and states that this is distance she travels at home in one time. she appears to be at her baseline.    Stairs             Wheelchair Mobility    Modified Rankin (Stroke Patients Only)       Balance Overall balance assessment: Needs assistance Sitting-balance support: No upper extremity supported;Feet supported Sitting balance-Leahy Scale: Good     Standing balance support: Bilateral upper extremity supported;During functional activity Standing balance-Leahy Scale: Poor Standing balance comment: relies on UE support                            Cognition Arousal/Alertness: Awake/alert Behavior During Therapy: WFL for tasks assessed/performed Overall Cognitive Status: Within Functional Limits for tasks assessed                                        Exercises General Exercises - Lower Extremity Ankle Circles/Pumps: AROM;Both;5 reps;Seated Long Arc Quad: AROM;Both;10 reps;Seated Hip Flexion/Marching: AROM;Both;10 reps;Standing    General Comments        Pertinent Vitals/Pain Pain Assessment: Faces Faces Pain Scale: Hurts even more Pain Location: right knee Pain Descriptors / Indicators: Aching;Grimacing;Guarding Pain Intervention(s): Limited activity within patient's tolerance;Monitored during session;Repositioned    Home Living                      Prior Function  PT Goals (current goals can now be found in the care plan section) Acute Rehab PT Goals Patient Stated Goal: to go home Progress towards PT goals: Progressing toward goals    Frequency    Min 3X/week      PT Plan Current plan remains appropriate    Co-evaluation              AM-PAC PT "6 Clicks" Daily Activity  Outcome Measure  Difficulty turning over in bed (including adjusting bedclothes, sheets and blankets)?: None Difficulty moving from lying on back to sitting on the  side of the bed? : A Little Difficulty sitting down on and standing up from a chair with arms (e.g., wheelchair, bedside commode, etc,.)?: A Little Help needed moving to and from a bed to chair (including a wheelchair)?: A Little Help needed walking in hospital room?: A Little Help needed climbing 3-5 steps with a railing? : Total 6 Click Score: 17    End of Session Equipment Utilized During Treatment: Gait belt;Oxygen Activity Tolerance: Patient limited by fatigue;Patient limited by pain Patient left: in bed;with call bell/phone within reach;with family/visitor present(on EOB) Nurse Communication: Mobility status PT Visit Diagnosis: Unsteadiness on feet (R26.81);Muscle weakness (generalized) (M62.81);Pain Pain - Right/Left: Right Pain - part of body: Knee     Time: 1056-1106 PT Time Calculation (min) (ACUTE ONLY): 10 min  Charges:  $Gait Training: 8-22 mins                    G Codes:       Massimiliano Rohleder,PT Acute Rehabilitation [phone] (pager)    Berline Lopes 10/24/2017, 11:39 AM

## 2017-10-24 NOTE — Progress Notes (Signed)
Progress Note  Patient Name: [name] [name] Date of Encounter: 10/24/2017  Primary Cardiologist: No primary care provider on file.   Subjective   Denies any chest pain or SOB  Inpatient Medications    Scheduled Meds: . amiodarone  400 mg Oral BID  . amoxicillin  500 mg Oral Q12H  . apixaban  5 mg Oral BID  . carvedilol  25 mg Oral BID WC  . cholecalciferol  2,000 Units Oral Daily  . colestipol  1 g Oral Daily  . folic acid  1 mg Oral Daily  . furosemide  80 mg Intravenous BID  . gabapentin  300 mg Oral QHS  . mouth rinse  15 mL Mouth Rinse BID  . methotrexate  15 mg Oral Weekly  . potassium chloride SA  40 mEq Oral BID  . predniSONE  10 mg Oral Q breakfast  . sodium chloride flush  3 mL Intravenous Q12H   Continuous Infusions: . potassium chloride 10 mEq (10/24/17 0824)   PRN Meds: acetaminophen **OR** acetaminophen, alum & mag hydroxide-simeth, bisacodyl, HYDROcodone-acetaminophen, ipratropium-albuterol, LORazepam, ondansetron **OR** ondansetron (ZOFRAN) IV, senna-docusate   Vital Signs    Vitals:   10/23/17 2033 10/23/17 2357 10/24/17 0321 10/24/17 0751  BP: (!) 117/59 115/83 124/74 103/63  Pulse: 70 62 67 62  Resp: 18 18 18    Temp: 97.7 F (36.5 C) 98.1 F (36.7 C) 97.9 F (36.6 C) 97.7 F (36.5 C)  TempSrc: Axillary Oral Oral Oral  SpO2: 95% 94% 92% 97%  Weight:   287 lb 8 oz (130.4 kg)   Height:        Intake/Output Summary (Last 24 hours) at 10/24/2017 0827 Last data filed at 10/24/2017 0300 Gross per 24 hour  Intake 960 ml  Output 5400 ml  Net -4440 ml   Filed Weights   10/22/17 0415 10/23/17 0307 10/24/17 0321  Weight: 296 lb (134.3 kg) 291 lb 4.8 oz (132.1 kg) 287 lb 8 oz (130.4 kg)    Telemetry    NSR - Personally Reviewed  ECG    No new EKG to review - Personally Reviewed  Physical Exam   GEN: No acute distress.   Neck: No JVD Cardiac: RRR, no murmurs, rubs, or gallops.  Respiratory: Clear to auscultation bilaterally. GI: Soft,  nontender, non-distended  MS: No edema; No deformity. Neuro:  Nonfocal  Psych: Normal affect   Labs    Chemistry Recent Labs  Lab 10/22/17 0354 10/23/17 0440 10/24/17 0509  NA 138 140 138  K 4.8 2.5* 2.5*  CL 100* 91* 86*  CO2 27 36* 42*  GLUCOSE 146* 101* 100*  BUN 15 15 14   CREATININE 1.26* 1.24* 1.35*  CALCIUM 8.7* 8.6* 8.5*  GFRNONAA 40* 41* 37*  GFRAA 46* 47* 42*  ANIONGAP 11 13 10      Hematology Recent Labs  Lab 10/18/17 0431  WBC 7.8  RBC 3.59*  HGB 11.4*  HCT 36.3  MCV 101.1*  MCH 31.8  MCHC 31.4  RDW 14.2  PLT 239    Cardiac EnzymesNo results for input(s): TROPONINI in the last 168 hours. No results for input(s): TROPIPOC in the last 168 hours.   BNPNo results for input(s): BNP, PROBNP in the last 168 hours.   DDimer No results for input(s): DDIMER in the last 168 hours.   Radiology    No results found.  Cardiac Studies   Echocardiogram 10/08/17: Study Conclusions  - Left ventricle: The cavity size was normal. Wall thickness was increased in  a pattern of mild LVH. Systolic function was mildly reduced. The estimated ejection fraction was in the range of 45% to 50%. Diffuse hypokinesis. The study is not technically sufficient to allow evaluation of LV diastolic function. Ejection fraction (MOD, 2-plane): 47%. - Aortic valve: Trileaflet. Sclerosis without stenosis. There was no regurgitation. - Mitral valve: Mildly thickened leaflets . There was moderate regurgitation. - Left atrium: The atrium was normal in size. - Inferior vena cava: The vessel was dilated. The respirophasic diameter changes were blunted (<50%), consistent with elevated central venous pressure.  Impressions:  - LVEF 45-50%, mild LVH, global hypokinesis, moderate MR, normal LA size, dilated IVC.  Patient Profile     77 y.o. female with PMH of HTN, chronic systolic CHF, persistent atrial fibrillation on Eliquis s/p DCCV 5/19, who presented with  SOB and was found to be in Afib RVR.   Assessment & Plan    1. Atrial fibrillation with RVR: presented with HR in the 140s.  - Continues to maintain normal sinus rhythm - Continue eliquis for anticoagulation - Continue amiodarone for rhythm control - Continue BBlocker for rate control  2. Acute on chronic systolic CHF -She put out 5.4 L yesterday and is net -11.8 L. -She is down 9 pounds since 6 7 -Creatinine starting to trend upward and now at 1.35 (1.1 at baseline) -We will change Lasix to 80 mg p.o. twice daily -Continue coreg; ARB on hold to allow BP room for diuresis  3. HTN: BP is soft today at 103/63 mmHg -Continue coreg -ARB on hold to allow room for diuresis  4. Hypokalemia:  -K 2.5 this AM and despite repletion yesterday.  This is due to aggressive diuresis..  -Replete potassium again today -Continue to monitor closely and replete as needed; goal >4    For questions or updates, please contact [facility] Please consult [email or web address] for contact info under Cardiology/STEMI.      Signed, Armanda Magicraci Jaleiyah Alas, MD  10/24/2017, 8:27 AM

## 2017-10-24 NOTE — Progress Notes (Signed)
PROGRESS NOTE  Attending MD note Patient was seen, examined,treatment plan was discussed with the PA-S.  I have personally reviewed the clinical findings, lab, imaging studies and management of this patient in detail. I agree with the documentation, as recorded by the PA-S  Patient is 77 year old female with history of A. fib on Eliquis, hypertension, chronic systolic CHF, PMR on Prednisone and Mtx, the ED on 6/4 with worsening dyspnea as well as palpitations, found to be in A. fib with RVR.  Cardiology consulted.  BP 119/71 (BP Location: Left Wrist)   Pulse 62   Temp 98.8 F (37.1 C) (Oral)   Resp 19   Ht 5\' 4"  (1.626 m)   Wt 130.4 kg (287 lb 8 oz)   SpO2 97%   BMI 49.35 kg/m  On Exam: Gen. exam: Awake, alert, not in any distress Chest: Good air entry bilaterally, no rhonchi, faint bibasilar crackles CVS: S1-S2 regular, no murmurs Abdomen: Soft, nontender and nondistended Neurology: Non-focal Skin: No rash or lesions  Plan   Acute on chronic systolic CHF -Prior to the echo showed an EF of 45%.  She has been diuresed with IV Lasix, she is net negative, on 11 L total and her weight is down 9 pounds in the last few days.  Cardiology following, appreciate input -Creatinine slightly increased today, stop IV Lasix and convert to p.o.  Hypokalemia -Likely in the setting of aggressive diuresis, currently on scheduled potassium supplementation as well as extra IV doses this morning we will continue to monitor  A. fib with RVR -Anticoagulated with Eliquis, she is status post DCCV at the end of May, no recurrent A. fib.  She was placed on amiodarone and converted to sinus rhythm, continue beta-blocker as well as amiodarone  PMR -Continue methotrexate, folate, prednisone  Normocytic anemia -The setting of chronic disease, monitor hemoglobin, overall stable no evidence of bleeding  Enterococcus UTI -Status post completed treatment for 5 days with amoxicillin   Rest as  below  Dawson Hollman M. Elvera LennoxGherghe, MD [facility] [phone]  If 7PM-7AM, please contact night-coverage [email or web address] Password [email or web address]      Brief Narrative: 77 year old female with medical history significant for atrial fibrillation on Elliquis, hypertension, chronic systolic CHF, and polymyalgia rheumatica on Methotrexate,prednisone, and Norco 10 q6 hrs for pain. Presented to the ED on 10/17/17 EMS with worsening dyspnea on exertion and palpitations then admitted to the hospital for management of Afib with RVR.   Subjective: Patient is sitting up at bedside at the time of evaluation. She reports that she is feeling much better, her edema and abdominal distension have improved. She reports her breathing is better and she is less short of breath. She also reports some fatigue from being in the hospital and she does not have much energy to move around. Denies pain, fever, chest pain, nausea, dysuria.  Objective: Vital signs in last 24 hours: Temp:  [97.7 F (36.5 C)-98.8 F (37.1 C)] 98.8 F (37.1 C) (06/11 0826) Pulse Rate:  [62-70] 62 (06/11 0826) Resp:  [18-19] 19 (06/11 0826) BP: (103-124)/(59-83) 119/71 (06/11 0826) SpO2:  [92 %-97 %] 97 % (06/11 0826) Weight:  [130.4 kg (287 lb 8 oz)] 130.4 kg (287 lb 8 oz) (06/11 0321) Weight change: -1.724 kg (-3 lb 12.8 oz) Last BM Date: 10/24/17  Intake/Output from previous day: 06/10 0701 - 06/11 0700 In: 960 [P.O.:960] Out: 5400 [Urine:5400] Intake/Output this shift: Total I/O In: 240 [P.O.:240] Out: 500 [Urine:500]  PE: General appearance: alert, cooperative and  no distress Head: Normocephalic, without obvious abnormality, atraumatic Eyes: EOMI, conjunctiva normal, sclera nonicteric Neck: supple Resp: Normal repiratory effort, some crackles noted in bases of bilateral lobes Cardio: Regular rate, no appreciable murmurs, rubs, gallops GI: Obese, no TTP Extremities: Unna boots on bilateral LE with edema noted bilaterally Neuro:  Alert and oriented x 3, diminished sensation in bilateral dorsal feet  Lab Results: No results for input(s): WBC, HGB, HCT, PLT in the last 72 hours. BMET Recent Labs    10/23/17 0440 10/24/17 0509  NA 140 138  K 2.5* 2.5*  CL 91* 86*  CO2 36* 42*  GLUCOSE 101* 100*  BUN 15 14  CREATININE 1.24* 1.35*  CALCIUM 8.6* 8.5*    Studies/Results: No results found.  Medications:  Scheduled: . amiodarone  400 mg Oral BID  . apixaban  5 mg Oral BID  . carvedilol  25 mg Oral BID WC  . cholecalciferol  2,000 Units Oral Daily  . colestipol  1 g Oral Daily  . folic acid  1 mg Oral Daily  . furosemide  80 mg Oral BID  . gabapentin  300 mg Oral QHS  . mouth rinse  15 mL Mouth Rinse BID  . methotrexate  15 mg Oral Weekly  . potassium chloride SA  40 mEq Oral BID  . predniSONE  10 mg Oral Q breakfast  . sodium chloride flush  3 mL Intravenous Q12H   Continuous: . potassium chloride 10 mEq (10/24/17 1215)   QMV:[id number] **OR** acetaminophen, alum & mag hydroxide-simeth, bisacodyl, HYDROcodone-acetaminophen, ipratropium-albuterol, LORazepam, ondansetron **OR** ondansetron (ZOFRAN) IV, senna-docusate  Assessment/Plan: 1. Afib with RVR, resolved. Patient converted to normal sinus rhythm on 10/21/17 and has maintained this rhythm since. Continue Eliquis for anticoagulation and amiodarone for rhythm control, per cardiology.  2. Acute on chronic systolic CHF, improving. Abdominal distension and LE edema are improving. She put out 4.5 L yesterday. She has lost a total of 9 lb in the past 3 days of diuresis. Cr is slightly elevated from yesterday at 1.35. Switch to PO Lasix 80 mg BID. Continue Carvedilol.   3. Hypokalemia. K+ is 2.5 again this am, likely due to aggressive diuresis. Replete K+ again today with IV KCl. Will recheck BMP in am to monitor potassium level and kidney function.  4. UTI, resolved. Patient completed amoxicillin course yesterday and has no fever or urinary  symptoms.  5. PMR, stable. Patient denies any pain today. Continue home prednisone, methotrexate and Norco.   LOS: 7 days   Melissa Haug 10/24/2017, 12:29 PM

## 2017-10-25 DIAGNOSIS — E876 Hypokalemia: Secondary | ICD-10-CM

## 2017-10-25 LAB — BASIC METABOLIC PANEL
ANION GAP: 15 (ref 5–15)
BUN: 15 mg/dL (ref 6–20)
CHLORIDE: 84 mmol/L — AB (ref 101–111)
CO2: 39 mmol/L — AB (ref 22–32)
Calcium: 9 mg/dL (ref 8.9–10.3)
Creatinine, Ser: 1.11 mg/dL — ABNORMAL HIGH (ref 0.44–1.00)
GFR calc non Af Amer: 46 mL/min — ABNORMAL LOW (ref >60)
GFR, EST AFRICAN AMERICAN: 54 mL/min — AB (ref >60)
Glucose, Bld: 97 mg/dL (ref 65–99)
Potassium: 2.8 mmol/L — ABNORMAL LOW (ref 3.5–5.1)
Sodium: 138 mmol/L (ref 135–145)

## 2017-10-25 LAB — POTASSIUM: Potassium: 3.1 mmol/L — ABNORMAL LOW (ref 3.5–5.1)

## 2017-10-25 MED ORDER — POTASSIUM CHLORIDE CRYS ER 20 MEQ PO TBCR
40.0000 meq | EXTENDED_RELEASE_TABLET | Freq: Once | ORAL | Status: AC
  Administered 2017-10-25: 40 meq via ORAL
  Filled 2017-10-25: qty 2

## 2017-10-25 MED ORDER — POTASSIUM CHLORIDE CRYS ER 20 MEQ PO TBCR
40.0000 meq | EXTENDED_RELEASE_TABLET | Freq: Three times a day (TID) | ORAL | Status: DC
  Administered 2017-10-25 – 2017-10-26 (×2): 40 meq via ORAL
  Filled 2017-10-25 (×3): qty 2

## 2017-10-25 MED ORDER — LOSARTAN POTASSIUM 25 MG PO TABS
25.0000 mg | ORAL_TABLET | Freq: Every day | ORAL | Status: DC
  Administered 2017-10-25 – 2017-10-26 (×2): 25 mg via ORAL
  Filled 2017-10-25 (×2): qty 1

## 2017-10-25 NOTE — Progress Notes (Signed)
PROGRESS NOTE  [name] [name] VWU:[id number] DOB: [birth year] DOA: 10/17/2017 PCP: Leanora Ivanoff., MD   LOS: 8 days   Attending MD note  Patient was seen, examined,treatment plan was discussed with the PA-S.  I have personally reviewed the clinical findings, lab, imaging studies and management of this patient in detail. I agree with the documentation, as recorded by the PA-S  Patient is 77 year old female with history of A. fib on Eliquis, hypertension, chronic systolic CHF, PMR on Prednisone and Mtx, the ED on 6/4 with worsening dyspnea as well as palpitations, found to be in A. fib with RVR.  Cardiology consulted, now on amiodarone and back to sinus rhythm  BP 122/71 (BP Location: Left Wrist)   Pulse 61   Temp 97.6 F (36.4 C) (Oral)   Resp 19   Ht 5\' 4"  (1.626 m)   Wt 130.3 kg (287 lb 4.8 oz)   SpO2 97%   BMI 49.31 kg/m  On Exam: Gen. exam: Awake, alert, not in any distress Chest: Good air entry bilaterally, no rhonchi, faint bibasilar crackles, improving  CVS: S1-S2 regular, no murmurs Abdomen: Soft, nontender and nondistended Neurology: Non-focal Skin: No rash or lesions  Plan  Acute on chronic systolic CHF -Prior to the echo showed an EF of 45%.  She has been diuresed with IV Lasix, she is net negative, on 13 L total and her weight is down 9 pounds in the last few days.  Cardiology following, appreciate input, signed off today  -Cr stable today, transitioned to po Lasix yesterday  Hypokalemia -Likely in the setting of aggressive diuresis, currently on scheduled potassium supplementation  -K still low, supplement and recheck this evening. If K > 3 home in am   A. fib with RVR -Anticoagulated with Eliquis, she is status post DCCV at the end of May, no recurrent A. fib.  She was placed on amiodarone and converted to sinus rhythm, continue beta-blocker as well as amiodarone -remains in sinus on telemetry   PMR -Continue methotrexate, folate,  prednisone  Normocytic anemia -The setting of chronic disease, monitor hemoglobin, overall stable no evidence of bleeding  Enterococcus UTI -Status post completed treatment for 5 days with amoxicillin    Rest as below  [name] M. Elvera Lennox, MD [facility] [phone]  If 7PM-7AM, please contact night-coverage [email or web address] Password [email or web address]    Brief Narrative / Interim history: 77 year old female with medical history significant for atrial fibrillation on Elliquis, hypertension, chronic systolic CHF, and polymyalgia rheumatica on Methotrexate,prednisone, and Norco 10 q6 hrs for pain.Presented to the ED on 6/4/19EMS with worsening dyspnea on exertion and palpitations then admitted to the hospital for management of Afib with RVR.  Assessment & Plan: Principal Problem:   Atrial fibrillation with RVR (HCC) Active Problems:   Polymyalgia (HCC)   Chronic systolic CHF (congestive heart failure) (HCC)   Acute on chronic combined systolic and diastolic CHF (congestive heart failure) (HCC)  1. Hypokalemia.  -Potassium is 2.8 today, improved since yesterday but still low. -Replete potassium again today with PO KCl TID.  -Recheck potassium levels this afternoon to see how she responds today.  2. Afib with RVR -Resolved -Maintained normal sinus rhythm since 6/8 -Continue Eliquis for anticoagulation and amiodarone for rhythm control, per cardiology.  3. Acute on chronic systolic CHF -Improving, abdominal distension has decreased. -LE edema still present, but patient feels it its improving. -1.5 L out yesterday with PO diureses -Cr improving, 1.11 today -Continue PO Lasix 80 mg BID and Carvedilol.  4. PMR -Stable, denies pain today. -Continue home prednisone, methotrexate and Norco.  DVT prophylaxis: Elliquis Code Status: Full code Family Communication: Husband at bedside Disposition Plan: Home on Thursday pending potassium level  Subjective: Patient sitting up  at bedside today. She reports she continues to feel better. Her breathing has improved but she was unable to come off of the O2 last night. She reports that her O2 sats dropped into the upper 80s when she removed the nasal cannula. She did not feel particularly short of breath at that time but did but the O2 back on to 2L. She denies fevers, SOB, chest pain, nausea.  Objective: Vitals:   10/24/17 1809 10/24/17 1935 10/25/17 0500 10/25/17 0518  BP:  139/85  122/71  Pulse:  64  61  Resp:      Temp:  98 F (36.7 C)  97.6 F (36.4 C)  TempSrc:  Oral  Oral  SpO2: 94% 96%  97%  Weight:   130.3 kg (287 lb 4.8 oz)   Height:        Intake/Output Summary (Last 24 hours) at 10/25/2017 1114 Last data filed at 10/25/2017 1000 Gross per 24 hour  Intake 795 ml  Output 2151 ml  Net -1356 ml   Filed Weights   10/23/17 0307 10/24/17 0321 10/25/17 0500  Weight: 132.1 kg (291 lb 4.8 oz) 130.4 kg (287 lb 8 oz) 130.3 kg (287 lb 4.8 oz)    Examination:  Constitutional: NAD Eyes: EOM intact, lids and conjunctivae normal Neck: supple, no masses Respiratory: clear to auscultation bilaterally, no wheezing, no crackles. Normal respiratory effort. No accessory muscle use.  Cardiovascular: Regular rate and rhythm, no murmurs / rubs / gallops. LE edema noted bilateral with Unna boots in place. Abdomen: no tenderness.   Skin: no rashes, lesions, ulcers. No induration Neurologic: Alert and oriented x 3. Psychiatric: Normal judgment and insight. Normal mood.    Data Reviewed: I have independently reviewed following labs and imaging studies   CBC: No results for input(s): WBC, NEUTROABS, HGB, HCT, MCV, PLT in the last 168 hours. Basic Metabolic Panel: Recent Labs  Lab 10/21/17 0814 10/22/17 0354 10/23/17 0440 10/24/17 0509 10/25/17 0353  NA 140 138 140 138 138  K 3.4* 4.8 2.5* 2.5* 2.8*  CL 97* 100* 91* 86* 84*  CO2 31 27 36* 42* 39*  GLUCOSE 113* 146* 101* 100* 97  BUN 13 15 15 14 15    CREATININE 1.13* 1.26* 1.24* 1.35* 1.11*  CALCIUM 8.5* 8.7* 8.6* 8.5* 9.0  MG  --   --  2.2  --   --    GFR: Estimated Creatinine Clearance: 56 mL/min (A) (by C-G formula based on SCr of 1.11 mg/dL (H)). Liver Function Tests: No results for input(s): AST, ALT, ALKPHOS, BILITOT, PROT, ALBUMIN in the last 168 hours. No results for input(s): LIPASE, AMYLASE in the last 168 hours. No results for input(s): AMMONIA in the last 168 hours. Coagulation Profile: No results for input(s): INR, PROTIME in the last 168 hours. Cardiac Enzymes: No results for input(s): CKTOTAL, CKMB, CKMBINDEX, TROPONINI in the last 168 hours. BNP (last 3 results) No results for input(s): PROBNP in the last 8760 hours. HbA1C: No results for input(s): HGBA1C in the last 72 hours. CBG: No results for input(s): GLUCAP in the last 168 hours. Lipid Profile: No results for input(s): CHOL, HDL, LDLCALC, TRIG, CHOLHDL, LDLDIRECT in the last 72 hours. Thyroid Function Tests: No results for input(s): TSH, T4TOTAL, FREET4, T3FREE, THYROIDAB in the  last 72 hours. Anemia Panel: No results for input(s): VITAMINB12, FOLATE, FERRITIN, TIBC, IRON, RETICCTPCT in the last 72 hours. Urine analysis:    Component Value Date/Time   COLORURINE STRAW (A) 10/17/2017 1616   APPEARANCEUR CLEAR 10/17/2017 1616   LABSPEC 1.005 10/17/2017 1616   PHURINE 7.0 10/17/2017 1616   GLUCOSEU NEGATIVE 10/17/2017 1616   HGBUR NEGATIVE 10/17/2017 1616   BILIRUBINUR NEGATIVE 10/17/2017 1616   KETONESUR NEGATIVE 10/17/2017 1616   PROTEINUR NEGATIVE 10/17/2017 1616   UROBILINOGEN 0.2 10/27/2006 1337   NITRITE NEGATIVE 10/17/2017 1616   LEUKOCYTESUR SMALL (A) 10/17/2017 1616   Sepsis Labs: Invalid input(s): PROCALCITONIN, LACTICIDVEN  Recent Results (from the past 240 hour(s))  Culture, Urine     Status: Abnormal   Collection Time: 10/17/17  6:01 PM  Result Value Ref Range Status   Specimen Description URINE, RANDOM  Final   Special Requests    Final    NONE Performed at [facility], [address], [place], [place] [address]    Culture >=100,000 COLONIES/mL ENTEROCOCCUS FAECALIS (A)  Final   Report Status 10/19/2017 FINAL  Final   Organism ID, Bacteria ENTEROCOCCUS FAECALIS (A)  Final      Susceptibility   Enterococcus faecalis - MIC*    AMPICILLIN <=2 SENSITIVE Sensitive     LEVOFLOXACIN 2 SENSITIVE Sensitive     NITROFURANTOIN <=16 SENSITIVE Sensitive     VANCOMYCIN 2 SENSITIVE Sensitive     * >=100,000 COLONIES/mL ENTEROCOCCUS FAECALIS      Radiology Studies: No results found.   Scheduled Meds: . amiodarone  400 mg Oral BID  . apixaban  5 mg Oral BID  . carvedilol  25 mg Oral BID WC  . cholecalciferol  2,000 Units Oral Daily  . colestipol  1 g Oral Daily  . folic acid  1 mg Oral Daily  . furosemide  80 mg Oral BID  . gabapentin  300 mg Oral QHS  . losartan  25 mg Oral Daily  . mouth rinse  15 mL Mouth Rinse BID  . methotrexate  15 mg Oral Weekly  . potassium chloride SA  40 mEq Oral TID  . predniSONE  10 mg Oral Q breakfast  . sodium chloride flush  3 mL Intravenous Q12H    Cindee Lame, PA-S [facility] Pager [phone]  If 7PM-7AM, please contact night-coverage [email or web address] Password [email or web address] 10/25/2017, 11:14 AM

## 2017-10-25 NOTE — Progress Notes (Signed)
Progress Note  Patient Name: [name] [name] Date of Encounter: 10/25/2017  Primary Cardiologist: No primary care provider on file.   Subjective   Denies any CP or SOB  Inpatient Medications    Scheduled Meds: . amiodarone  400 mg Oral BID  . apixaban  5 mg Oral BID  . carvedilol  25 mg Oral BID WC  . cholecalciferol  2,000 Units Oral Daily  . colestipol  1 g Oral Daily  . folic acid  1 mg Oral Daily  . furosemide  80 mg Oral BID  . gabapentin  300 mg Oral QHS  . mouth rinse  15 mL Mouth Rinse BID  . methotrexate  15 mg Oral Weekly  . potassium chloride SA  40 mEq Oral BID  . predniSONE  10 mg Oral Q breakfast  . sodium chloride flush  3 mL Intravenous Q12H   Continuous Infusions:  PRN Meds: acetaminophen **OR** acetaminophen, alum & mag hydroxide-simeth, bisacodyl, HYDROcodone-acetaminophen, ipratropium-albuterol, LORazepam, ondansetron **OR** ondansetron (ZOFRAN) IV, senna-docusate   Vital Signs    Vitals:   10/24/17 1809 10/24/17 1935 10/25/17 0500 10/25/17 0518  BP:  139/85  122/71  Pulse:  64  61  Resp:      Temp:  98 F (36.7 C)  97.6 F (36.4 C)  TempSrc:  Oral  Oral  SpO2: 94% 96%  97%  Weight:   287 lb 4.8 oz (130.3 kg)   Height:        Intake/Output Summary (Last 24 hours) at 10/25/2017 0923 Last data filed at 10/25/2017 0600 Gross per 24 hour  Intake 720 ml  Output 1900 ml  Net -1180 ml   Filed Weights   10/23/17 0307 10/24/17 0321 10/25/17 0500  Weight: 291 lb 4.8 oz (132.1 kg) 287 lb 8 oz (130.4 kg) 287 lb 4.8 oz (130.3 kg)    Telemetry    NSR - Personally Reviewed  ECG    No new EKG to review - Personally Reviewed  Physical Exam   GEN: No acute distress.   Neck: No JVD Cardiac: RRR, no murmurs, rubs, or gallops.  Respiratory: Clear to auscultation bilaterally. GI: Soft, nontender, non-distended  MS: No edema; No deformity. Neuro:  Nonfocal  Psych: Normal affect   Labs    Chemistry Recent Labs  Lab 10/23/17 0440  10/24/17 0509 10/25/17 0353  NA 140 138 138  K 2.5* 2.5* 2.8*  CL 91* 86* 84*  CO2 36* 42* 39*  GLUCOSE 101* 100* 97  BUN 15 14 15   CREATININE 1.24* 1.35* 1.11*  CALCIUM 8.6* 8.5* 9.0  GFRNONAA 41* 37* 46*  GFRAA 47* 42* 54*  ANIONGAP 13 10 15      HematologyNo results for input(s): WBC, RBC, HGB, HCT, MCV, MCH, MCHC, RDW, PLT in the last 168 hours.  Cardiac EnzymesNo results for input(s): TROPONINI in the last 168 hours. No results for input(s): TROPIPOC in the last 168 hours.   BNPNo results for input(s): BNP, PROBNP in the last 168 hours.   DDimer No results for input(s): DDIMER in the last 168 hours.   Radiology    No results found.  Cardiac Studies   Echocardiogram 10/08/17: Study Conclusions  - Left ventricle: The cavity size was normal. Wall thickness was increased in a pattern of mild LVH. Systolic function was mildly reduced. The estimated ejection fraction was in the range of 45% to 50%. Diffuse hypokinesis. The study is not technically sufficient to allow evaluation of LV diastolic function. Ejection fraction (MOD,  2-plane): 47%. - Aortic valve: Trileaflet. Sclerosis without stenosis. There was no regurgitation. - Mitral valve: Mildly thickened leaflets . There was moderate regurgitation. - Left atrium: The atrium was normal in size. - Inferior vena cava: The vessel was dilated. The respirophasic diameter changes were blunted (<50%), consistent with elevated central venous pressure.  Impressions:  - LVEF 45-50%, mild LVH, global hypokinesis, moderate MR, normal LA size, dilated IVC.   Patient Profile     77 y.o. female with PMH of HTN, chronic systolic CHF, persistent atrial fibrillation on Eliquis s/p DCCV 5/19, who presented with SOB and was found to be in Afib RVR.   Assessment & Plan    1. Atrial fibrillation with ZOX:[id number]:presented with HR in the 140s.  - She remains in NSR - Continue eliquis for anticoagulation -  Continue amiodarone for rhythm control - Continue BBlocker for rate control - HR stable in low 60's on tele - continue Amio 400mg  BID for 3 more days then decrease to 400mg  daily for 2 weeks then 200mg  daily.    2. Acute on chronic systolic CHF -She put out 2.4 L yesterday and is net -13.2L. -She is down 9 pounds since 6/7 -Creatinine decreased from 1.35 > 1.11 after changing to PO lasix -continue Lasix to 80 mg p.o. twice daily -Continue coreg and ok to restart low dose ARB  3. HTN -BP well controlled on exam today at 122/4671mmHg -Continue coreg 25mg  BID -ok to start losartan back at 25mg  daily  4. Hypokalemia:  -K 2.8 this AM  despite repletion yesterday.  This is due to aggressive diuresis..  -Replete potassium again today per TRH -Continue to monitor closely and replete as needed; goal >4  No other recs at this time.  Will sign off.  Please call with any questions.  We will set up outpt followup in our office.   For questions or updates, please contact [facility] Please consult [email or web address] for contact info under Cardiology/STEMI.      Signed, Armanda Magicraci Dallen Bunte, MD  10/25/2017, 9:23 AM

## 2017-10-25 NOTE — Care Management Important Message (Signed)
Important Message  Patient Details  Name: [name] RocheBetty [name] MRN: [id number] Date of Birth: 10/19/1940   Medicare Important Message Given:  Yes    [name] [name] 10/25/2017, 1:04 PM

## 2017-10-26 DIAGNOSIS — M353 Polymyalgia rheumatica: Secondary | ICD-10-CM

## 2017-10-26 DIAGNOSIS — I509 Heart failure, unspecified: Secondary | ICD-10-CM

## 2017-10-26 LAB — BASIC METABOLIC PANEL
Anion gap: 15 (ref 5–15)
BUN: 14 mg/dL (ref 6–20)
CALCIUM: 9.1 mg/dL (ref 8.9–10.3)
CO2: 40 mmol/L — ABNORMAL HIGH (ref 22–32)
Chloride: 86 mmol/L — ABNORMAL LOW (ref 101–111)
Creatinine, Ser: 1.14 mg/dL — ABNORMAL HIGH (ref 0.44–1.00)
GFR calc Af Amer: 52 mL/min — ABNORMAL LOW (ref >60)
GFR, EST NON AFRICAN AMERICAN: 45 mL/min — AB (ref >60)
GLUCOSE: 108 mg/dL — AB (ref 65–99)
Potassium: 3.1 mmol/L — ABNORMAL LOW (ref 3.5–5.1)
SODIUM: 141 mmol/L (ref 135–145)

## 2017-10-26 MED ORDER — AMIODARONE HCL 400 MG PO TABS: 400.0000 mg | ORAL_TABLET | Freq: Two times a day (BID) | ORAL | 0 refills | Status: DC

## 2017-10-26 MED ORDER — FUROSEMIDE 40 MG PO TABS: 40.0000 mg | ORAL_TABLET | Freq: Two times a day (BID) | ORAL | 1 refills | Status: DC

## 2017-10-26 MED ORDER — POTASSIUM CHLORIDE ER 20 MEQ PO TBCR: 40.0000 meq | EXTENDED_RELEASE_TABLET | Freq: Two times a day (BID) | ORAL | 1 refills | Status: DC

## 2017-10-26 NOTE — Discharge Summary (Addendum)
Physician Discharge Summary  [name] [name] ZOX:[id number] DOB: 09-26-1940 DOA: 10/17/2017  PCP: Leanora Ivanoff., MD  Admit date: 10/17/2017 Discharge date: 10/26/2017  Admitted From: Home Disposition:  Home with home health  Recommendations for Outpatient Follow-up:  1. Follow up with PCP in 1-2 weeks 2. Please obtain BMP within one week to check potassium level  Home Health: RN/PT Equipment/Devices: 3n1 BSC  Discharge Condition: Stable CODE STATUS: Full code Diet recommendation: Heart healthy, low sodium  HPI: 77 year old female with medical history significant for paroxysmal atrial fibrillation on Elliquis, HTN, chronic systolic CHF, and polymyalgia rheumatica on methotrexate, prednisone, and Norco 10 for pain. She presented to the ED via EMS on 10/17/17 with worsening dyspnea on exertion and palpitations. One week prior she had been admitted to the hospital on 10/07/17 for paroxysmal atrial fibrillation with RVR and underwent DCCV on 10/11/17 which improved her symptoms and restored sinus rhythm. She was discharged home the next day on 2L O2. Within a few days she began to feel short of breath, particularly with exertion. The dyspnea continued to worsen and she felt palpitations, prompting her to call EMS.   ED Course: She was found to be in atrial fibrillation with RVR with HR between 110-140. ED cardiac workup included negative troponin, BNP 606.4, CXR with mild vascular congestion and bibasilar atelectasis, and EKG showing afib with RVR and paired PVCs. She was treated with 10 mg IV Diltiazem and 5 mg IV Lopressor which initially improved her rate, however, her rate continued to trend upwards to 110-140 again. Cardiology was consulted and recommended treatment with amiodarone and admission to the hospital under medicine.   Hospital Course:  Paroxysmal Afib with RVR- She remained tachycardic and dyspneic for several days following admission.  Cardiology was consulted and followed  patient while hospitalized.  She was started on amiodarone infusion, initially remained in A. fib with poorly controlled rates, however she eventually converted to sinus rhythm on 10/21/17 with amiodarone drip, at which point she was switched to PO amiodarone. She maintained sinus rhythm through1out the day of discharge. Continue PO amiodarone 400 mg BID, follow up with cardiologist within 1 week from discharge.  Patient is anticoagulated with Eliquis. Hypokalemia-there is a chronic component to it, patient has been having hypokalemia in the past and is on potassium supplementation at home.  Likely due to her chronic Lasix.  Given increased Lasix dose, her home potassium dose was increased as well.  She was advised to follow-up with her primary care MD in 4 days for repeat potassium level. UTI- On day of admission the patient reported urinary symptoms with dysuria for 2 days prior. UA showed rare bacteria, small amounts of leukocytes, and hyaline casts so she was started on Ceftriaxone for UTI. Urine culture grew enterococcus faecalis sensitive to ampicillin and she was switched to Amoxicillin. She completed a 5 day course of amoxicillin and dysuria resolved. Acute on chronic systolic CHF- BNP was elevated and CXR with mild vascular congestion upon presentation to the ED. She has chronic LE edema which she believed was worse than usual upon admission with some abdominal distension. She was aggressively diuresed with Lasix and metolazone.  She is net negative close to 60 L and her discharge weight is 286 pounds.  Still has some trace lower extremity edema on discharge which is chronic.  Discussed with cardiology on the day of discharge, will place on 80 mg of Lasix in the morning and 40 in the afternoon. PMR- Continue home  prednisone, methotrexate and Norco. Normocytic anemia- In the setting of chronic disease. Hgb was monitored over her hospital stay and remained stable. Follow up with PCP for CBC with in a week  to monitor.   Discharge Diagnoses:  Principal Problem:   Atrial fibrillation with RVR (HCC) Active Problems:   Polymyalgia (HCC)   Chronic systolic CHF (congestive heart failure) (HCC)   Acute on chronic combined systolic and diastolic CHF (congestive heart failure) (HCC)  Discharge Instructions   Allergies as of 10/26/2017      Reactions   Fluoxetine Other (See Comments)   Altered mental status   Adhesive [tape] Rash   Cannot tolerate any tape or bandaids more than 24 hrs.   Demerol [meperidine Hcl] Rash   Sulfa Antibiotics Rash      Medication List    TAKE these medications   alendronate 70 MG tablet Commonly known as:  FOSAMAX Take 70 mg by mouth once a week. Take with a full glass of water on an empty stomach.   amiodarone 400 MG tablet Commonly known as:  PACERONE Take 1 tablet (400 mg total) by mouth 2 (two) times daily.   apixaban 5 MG Tabs tablet Commonly known as:  ELIQUIS Take 1 tablet (5 mg total) by mouth 2 (two) times daily.   carvedilol 25 MG tablet Commonly known as:  COREG Take 1 tablet (25 mg total) by mouth 2 (two) times daily with a meal.   cholecalciferol 1000 units tablet Commonly known as:  VITAMIN D Take 2,000 Units by mouth daily.   colestipol 1 g tablet Commonly known as:  COLESTID Take 1 g by mouth daily.   ferrous sulfate 325 (65 FE) MG tablet Take 325 mg by mouth daily with breakfast.   folic acid 1 MG tablet Commonly known as:  FOLVITE Take 1 mg by mouth daily.   furosemide 40 MG tablet Commonly known as:  LASIX Take 1-2 tablets (40-80 mg total) by mouth 2 (two) times daily. Take 80 mg in the morning and 40 mg in the evening What changed:    how much to take  additional instructions   gabapentin 300 MG capsule Commonly known as:  NEURONTIN Take 300 mg by mouth at bedtime.   HYDROcodone-acetaminophen 10-325 MG tablet Commonly known as:  NORCO Take 1 tablet by mouth every 6 (six) hours as needed for pain.   losartan  25 MG tablet Commonly known as:  COZAAR Take 1 tablet (25 mg total) by mouth daily.   methotrexate 2.5 MG tablet Commonly known as:  RHEUMATREX Take 15 mg by mouth once a week.   Potassium Chloride ER 20 MEQ Tbcr Take 40 mEq by mouth 2 (two) times daily. What changed:  when to take this   predniSONE 5 MG tablet Commonly known as:  DELTASONE Take 10 mg by mouth daily with breakfast.            Durable Medical Equipment  (From admission, onward)        Start     Ordered   10/26/17 1131  For home use only DME 3 n 1  Once     10/26/17 1130     Follow-up Information    Quintella Reichert, MD. Schedule an appointment as soon as possible for a visit in 1 week(s).   Specialty:  Cardiology Contact information: [address] [place] [address] [phone]        Dellinger, [name], MD. Schedule an appointment  as soon as possible for a visit in 1 week(s).   Specialty:  Family Medicine Contact information: [address] [place] [address] [phone]        [facility], [facility] Follow up.   Specialty:  Home Health Services Why:  Physical Therapy, Registered Nurse,  Contact information: [address] [place] [address] [phone]           Consultations:  Cardiology  Procedures/Studies: Dg Chest Port 1 View  Result Date: 10/17/2017 CLINICAL DATA:  Acute onset of shortness of breath. Atrial fibrillation. EXAM: PORTABLE CHEST 1 VIEW COMPARISON:  Chest radiograph performed 11/17/2015 FINDINGS: The lungs are well-aerated. Minimal bibasilar atelectasis is noted. Mild vascular congestion is seen. There is no evidence of pleural effusion or pneumothorax. The cardiomediastinal silhouette is borderline normal in size. No acute osseous abnormalities are seen. IMPRESSION: Minimal bibasilar atelectasis noted. Mild vascular congestion seen. Electronically Signed   By: Roanna RaiderJeffery  Chang M.D.   On: 10/17/2017 03:02    Dg Chest Port 1 View  Result Date: 10/07/2017 CLINICAL DATA:  77 year-old female c/o feeling bloated, SOB, and weakness and decreased appetite x 3 days. EXAM: PORTABLE CHEST 1 VIEW COMPARISON:  None. FINDINGS: The heart size and mediastinal contours are within normal limits. Both lungs are clear. No pleural effusion or pneumothorax. The visualized skeletal structures are intact. IMPRESSION: No active disease. Electronically Signed   By: Amie Portlandavid  Ormond M.D.   On: 10/07/2017 11:42     Subjective: Patient sitting up at the edge of the bed this morning. She reports she is doing well. She reports no difficulty with breathing, however she is still on 2L O2 nasal cannula. She has been up and out of bed to walk to the bathroom, but has not been ambulating much more than that. She denies fever, chills, chest pain, SOB, nausea, dysuria.  Discharge Exam: Vitals:   10/25/17 2033 10/26/17 0456  BP: 122/78 126/76  Pulse: 61 62  Resp: 18 18  Temp: 97.9 F (36.6 C) 98.8 F (37.1 C)  SpO2: 95% 96%    General: Pt is alert, awake, not in acute distress, sitting up in bed Eyes: EOMI, conjunctiva normal, sclera nonicteric Neck: supple, no masses Cardiovascular: RRR, S1/S2 +, no appreciable murmurs, rubs, gallops Respiratory: CTA bilaterally, no wheezing, some crackles noted in base of left lung Abdominal: Soft, NT, ND Extremities: 2+ LE edema noted bilaterally but hard to appreciate fully due to UNA boots  Neurologic: Alert and oriented x 3 Psychiatric: Normal judgement and mood.   The results of significant diagnostics from this hospitalization (including imaging, microbiology, ancillary and laboratory) are listed below for reference.     Microbiology: Recent Results (from the past 240 hour(s))  Culture, Urine     Status: Abnormal   Collection Time: 10/17/17  6:01 PM  Result Value Ref Range Status   Specimen Description URINE, RANDOM  Final   Special Requests   Final    NONE Performed at  [facility], [address] [place], [place] [address]    Culture >=100,000 COLONIES/mL ENTEROCOCCUS FAECALIS (A)  Final   Report Status 10/19/2017 FINAL  Final   Organism ID, Bacteria ENTEROCOCCUS FAECALIS (A)  Final      Susceptibility   Enterococcus faecalis - MIC*    AMPICILLIN <=2 SENSITIVE Sensitive     LEVOFLOXACIN 2 SENSITIVE Sensitive     NITROFURANTOIN <=16 SENSITIVE Sensitive     VANCOMYCIN 2 SENSITIVE Sensitive     * >=  100,000 COLONIES/mL ENTEROCOCCUS FAECALIS     Labs: BNP (last 3 results) Recent Labs    10/07/17 1124 10/17/17 0251  BNP 920.6* 606.4*   Basic Metabolic Panel: Recent Labs  Lab 10/22/17 0354 10/23/17 0440 10/24/17 0509 10/25/17 0353 10/25/17 1507 10/26/17 0602  NA 138 140 138 138  --  141  K 4.8 2.5* 2.5* 2.8* 3.1* 3.1*  CL 100* 91* 86* 84*  --  86*  CO2 27 36* 42* 39*  --  40*  GLUCOSE 146* 101* 100* 97  --  108*  BUN 15 15 14 15   --  14  CREATININE 1.26* 1.24* 1.35* 1.11*  --  1.14*  CALCIUM 8.7* 8.6* 8.5* 9.0  --  9.1  MG  --  2.2  --   --   --   --    Liver Function Tests: No results for input(s): AST, ALT, ALKPHOS, BILITOT, PROT, ALBUMIN in the last 168 hours. No results for input(s): LIPASE, AMYLASE in the last 168 hours. No results for input(s): AMMONIA in the last 168 hours. CBC: No results for input(s): WBC, NEUTROABS, HGB, HCT, MCV, PLT in the last 168 hours. Cardiac Enzymes: No results for input(s): CKTOTAL, CKMB, CKMBINDEX, TROPONINI in the last 168 hours. BNP: Invalid input(s): POCBNP CBG: No results for input(s): GLUCAP in the last 168 hours. D-Dimer No results for input(s): DDIMER in the last 72 hours. Hgb A1c No results for input(s): HGBA1C in the last 72 hours. Lipid Profile No results for input(s): CHOL, HDL, LDLCALC, TRIG, CHOLHDL, LDLDIRECT in the last 72 hours. Thyroid function studies No results for input(s): TSH, T4TOTAL, T3FREE, THYROIDAB in the last 72 hours.  Invalid input(s): FREET3 Anemia  work up No results for input(s): VITAMINB12, FOLATE, FERRITIN, TIBC, IRON, RETICCTPCT in the last 72 hours. Urinalysis    Component Value Date/Time   COLORURINE STRAW (A) 10/17/2017 1616   APPEARANCEUR CLEAR 10/17/2017 1616   LABSPEC 1.005 10/17/2017 1616   PHURINE 7.0 10/17/2017 1616   GLUCOSEU NEGATIVE 10/17/2017 1616   HGBUR NEGATIVE 10/17/2017 1616   BILIRUBINUR NEGATIVE 10/17/2017 1616   KETONESUR NEGATIVE 10/17/2017 1616   PROTEINUR NEGATIVE 10/17/2017 1616   UROBILINOGEN 0.2 10/27/2006 1337   NITRITE NEGATIVE 10/17/2017 1616   LEUKOCYTESUR SMALL (A) 10/17/2017 1616   Sepsis Labs Invalid input(s): PROCALCITONIN,  WBC,  LACTICIDVEN   Time coordinating discharge: 35 minutes  SIGNED:  Jocelynn Gioffre M. Elvera Lennox, MD [facility] [phone]  Cindee Lame, PA-S  10/26/2017, 12:15 PM  If 7PM-7AM, please contact night-coverage [email or web address] Password [email or web address]

## 2017-10-26 NOTE — Care Management Note (Addendum)
Case Management Note  Patient Details  Name: [name] RocheBetty [name] MRN: [id number] Date of Birth: 01/20/1941  Subjective/Objective:  Pt presented for Atrial Fib- plan for home 10-26-17 with support of husband. Pt was active with [facility] for RN, PT- resumption orders received.                   Action/Plan: [facility] aware that pt will transition home- No further needs from CM at this time.   Expected Discharge Date:  10/26/17               Expected Discharge Plan:  Home w Home Health Services  In-House Referral:  NA  Discharge planning Services  CM Consult  Post Acute Care Choice:  Home Health, Resumption of Svcs/PTA Provider Choice offered to:  Patient  DME Arranged:  3n1 DME Agency:  [facility]  HH Arranged[facility]  [facility]  [facility]  Status of Service:  Completed, signed off  If discussed at Long Length of Stay Meetings, dates discussed:    Additional Comments: 1123 10-26-17 Tomi BambergerBrenda Graves-Bigelow, RN,BSN [phone] CM spoke with patient and she feels that a 3n1 will be beneficial for home. CM asked MD to place order. [facility] to be delivered to room prior to transition home. No further needs from CM @ this time.  Gala LewandowskyGraves-Bigelow, Jerod Mcquain Kaye, RN 10/26/2017, 11:15 AM

## 2017-10-26 NOTE — Progress Notes (Signed)
The patient has been given discharge instruction along with a new medication list and what to take today. She is discharging with a 3n1 BSC. She is discharging with her husband via car.   Sheppard [name] Zerenity Bowron RN

## 2017-10-26 NOTE — Progress Notes (Signed)
SATURATION QUALIFICATIONS: (This note is used to comply with regulatory documentation for home oxygen)  Patient Saturations on Room Air at Rest = 98%  Patient Saturations on Room Air while Ambulating = **93%  Patient Saturations on NA Liters of oxygen while Ambulating = NA%  Please briefly explain why patient needs home oxygen: The patient did not require oxygen at rest or walking at this time.   Sheppard Evensina Andoni Busch RN

## 2017-10-28 ENCOUNTER — Telehealth: Payer: Self-pay | Admitting: Cardiology

## 2017-10-28 NOTE — Telephone Encounter (Signed)
Pt called in reporting she was recently discharged from the hospital back on Thursday. States she was here with Afib and UTI. Appears to have been on Internal Medicine service. UTI resolved at the time of discharge. Calling in stating she thinks the UTI has returned and she feels her legs are swelling. Takes lasix. I advised she would try extra lasix for the swelling, but would need to seek treatment if she feels the UTI has returned. She understood and thanked me for the follow up call.   Laverda PageLindsay Kashius Dominic NP

## 2017-11-02 ENCOUNTER — Telehealth: Payer: Self-pay | Admitting: Cardiology

## 2017-11-02 NOTE — Telephone Encounter (Signed)
New message   [name] from [facility] calling to report: [name] Freestoneatty [phone]  Pt c/o swelling: STAT is pt has developed SOB within 24 hours  1) How much weight have you gained and in what time span?   2) If swelling, where is the swelling located? LEGS,   3) Are you currently taking a fluid pill? YES  4) Are you currently SOB? Yes, when walking  Do you have a log of your daily weights (if so, list)? 288 lbs today, 285lbs 6/18 5) Have you gained 3 pounds in a day or 5 pounds in a week? YES  6) Have you traveled recently? NO

## 2017-11-02 NOTE — Telephone Encounter (Signed)
Spoke with home health RN. Pt has not yet been established in our clinic, but has an appointment with Dr Mayford Knifeurner tomorrow. I advised Peggy, home health RN, pt should take an extra dose of her lasix this afternoon to help relieve symptoms. Pt understands she will need to address this with Dr Mayford Knifeurner tomorrow during her OV. Peggy and pt verbalized understanding and had no additional questions.

## 2017-11-03 ENCOUNTER — Ambulatory Visit: Payer: Medicare Other | Admitting: Cardiology

## 2017-11-03 ENCOUNTER — Other Ambulatory Visit: Payer: Self-pay | Admitting: Cardiology

## 2017-11-03 ENCOUNTER — Encounter: Payer: Self-pay | Admitting: Cardiology

## 2017-11-03 ENCOUNTER — Encounter (INDEPENDENT_AMBULATORY_CARE_PROVIDER_SITE_OTHER): Payer: Self-pay

## 2017-11-03 VITALS — BP 124/70 | HR 59 | Ht 64.0 in | Wt 290.8 lb

## 2017-11-03 DIAGNOSIS — I10 Essential (primary) hypertension: Secondary | ICD-10-CM | POA: Diagnosis not present

## 2017-11-03 DIAGNOSIS — I5022 Chronic systolic (congestive) heart failure: Secondary | ICD-10-CM

## 2017-11-03 DIAGNOSIS — I480 Paroxysmal atrial fibrillation: Secondary | ICD-10-CM

## 2017-11-03 DIAGNOSIS — I420 Dilated cardiomyopathy: Secondary | ICD-10-CM | POA: Diagnosis not present

## 2017-11-03 LAB — MAGNESIUM: Magnesium: 2.1 mg/dL (ref 1.6–2.3)

## 2017-11-03 LAB — BASIC METABOLIC PANEL
BUN/Creatinine Ratio: 10 — ABNORMAL LOW (ref 12–28)
BUN: 12 mg/dL (ref 8–27)
CALCIUM: 9 mg/dL (ref 8.7–10.3)
CO2: 25 mmol/L (ref 20–29)
CREATININE: 1.17 mg/dL — AB (ref 0.57–1.00)
Chloride: 97 mmol/L (ref 96–106)
GFR, EST AFRICAN AMERICAN: 52 mL/min/{1.73_m2} — AB (ref >59)
GFR, EST NON AFRICAN AMERICAN: 45 mL/min/{1.73_m2} — AB (ref >59)
Glucose: 119 mg/dL — ABNORMAL HIGH (ref 65–99)
POTASSIUM: 4.5 mmol/L (ref 3.5–5.2)
Sodium: 137 mmol/L (ref 134–144)

## 2017-11-03 MED ORDER — AMIODARONE HCL 200 MG PO TABS: 200.0000 mg | ORAL_TABLET | Freq: Every day | ORAL | 3 refills | Status: DC

## 2017-11-03 MED ORDER — TORSEMIDE 20 MG PO TABS: 40.0000 mg | ORAL_TABLET | Freq: Two times a day (BID) | ORAL | 0 refills | Status: DC

## 2017-11-03 NOTE — Progress Notes (Signed)
Cardiology Office Note:    Date:  11/03/2017   ID:  [name] [name], DOB 09-19-1940, MRN [id number]  PCP:  Lelon Huh [name], MD  Cardiologist:  No primary care provider on file.    Referring MD: Lelon Huh Romelle Starcher*   Chief Complaint  Patient presents with  . Atrial Fibrillation  . Congestive Heart Failure  . Hypertension    History of Present Illness:    [name] [name] is a 77 y.o. female with a hx of paroxysmal atrial fibrillation on chronic anti-coag lesion with Eliquis, hypertension, chronic systolic CHF, polymyalgia rheumatica on methotrexate and prednisone.  She recently was hospitalized on 4 June with worsening dyspnea on exertion and palpitations.  She had been in the hospital 1 week prior with new onset atrial fibrillation with RVR and underwent TEE /cardioversion to normal sinus rhythm.  She was found to be back in atrial fibrillation with RVR in the ER with heart rates of 110 to 140 bpm.  BNP was elevated at 606 and chest x-ray was consistent with CHF.  She was started on IV Cardizem drip and then started on amiodarone.  She converted spontaneously on amnio to sinus rhythm and she was switched to oral amiodarone.  She was discharged home on Lasix 80 mg in the morning 40 mg in the evening.  2D echocardiogram 10/08/2017 showed mildly reduced LV function with EF 45 to 50% with diffuse hypokinesis and moderate mitral regurgitation.  She is here today for followup.  She is compliant with her meds and is tolerating meds with no SE. fortunately she still has been eating some added salt in her diet when she eats out.  She had a steak the other night and asked the cook to lightly salted.  Over the past few days she has had more swelling her legs as well as increased abdominal fullness.  She is been weighing herself at home and is been around 284 pounds.  She denies any chest pain or pressure, palpitations, PND or orthopnea.  She has had some dyspnea on exertion.  Past Medical History:   Diagnosis Date  . Arthritis    "inflammatory" (10/17/2017)  . Female bladder prolapse   . History of blood transfusion 2008   "related to OR"  . Hypertension   . On home oxygen therapy    "2L; 24/7" (10/17/2017)  . PAF (paroxysmal atrial fibrillation) (HCC) paf  . Panic attacks 1991   "when I lost my son"  . Peripheral neuropathy    "BLE" (10/17/2017)  . Pneumonia 1943   "both lungs"  . Polymyalgia (HCC)   . PONV (postoperative nausea and vomiting)   . Recurrent UTI (urinary tract infection)     Past Surgical History:  Procedure Laterality Date  . ABDOMINAL HYSTERECTOMY    . CARDIOVERSION N/A 10/11/2017   Procedure: CARDIOVERSION;  Surgeon: Nahser, Deloris Ping, MD;  Location: [facility] ENDOSCOPY;  Service: Cardiovascular;  Laterality: N/A;  . CATARACT EXTRACTION W/ INTRAOCULAR LENS  IMPLANT, BILATERAL    . JOINT REPLACEMENT    . LAPAROSCOPIC CHOLECYSTECTOMY    . TEE WITHOUT CARDIOVERSION N/A 10/11/2017   Procedure: TRANSESOPHAGEAL ECHOCARDIOGRAM (TEE);  Surgeon: [name], MD;  Location: [facility] ENDOSCOPY;  Service: Cardiovascular;  Laterality: N/A;  . TOTAL KNEE ARTHROPLASTY Left 2008    Current Medications: Current Meds  Medication Sig  . alendronate (FOSAMAX) 70 MG tablet Take 70 mg by mouth once a week. Take with a full glass of water on an empty stomach.  Marland Kitchen  amiodarone (PACERONE) 400 MG tablet Take 1 tablet (400 mg total) by mouth 2 (two) times daily.  Marland Kitchen. apixaban (ELIQUIS) 5 MG TABS tablet Take 1 tablet (5 mg total) by mouth 2 (two) times daily.  . carvedilol (COREG) 25 MG tablet Take 1 tablet (25 mg total) by mouth 2 (two) times daily with a meal.  . cholecalciferol (VITAMIN D) 1000 units tablet Take 2,000 Units by mouth daily.  . colestipol (COLESTID) 1 g tablet Take 1 g by mouth daily.   . ferrous sulfate 325 (65 FE) MG tablet Take 325 mg by mouth daily with breakfast.  . folic acid (FOLVITE) 1 MG tablet Take 1 mg by mouth daily.  . furosemide (LASIX) 40 MG tablet Take 1-2  tablets (40-80 mg total) by mouth 2 (two) times daily. Take 80 mg in the morning and 40 mg in the evening  . gabapentin (NEURONTIN) 300 MG capsule Take 300 mg by mouth at bedtime.  Marland Kitchen. HYDROcodone-acetaminophen (NORCO) 10-325 MG tablet Take 1 tablet by mouth every 6 (six) hours as needed for pain.  Marland Kitchen. losartan (COZAAR) 25 MG tablet Take 1 tablet (25 mg total) by mouth daily.  . methotrexate (RHEUMATREX) 2.5 MG tablet Take 15 mg by mouth once a week.  . Potassium Chloride ER 20 MEQ TBCR Take 40 mEq by mouth 2 (two) times daily.  . predniSONE (DELTASONE) 5 MG tablet Take 10 mg by mouth daily with breakfast.      Allergies:   Fluoxetine; Adhesive [tape]; Demerol [meperidine hcl]; and Sulfa antibiotics   Social History   Socioeconomic History  . Marital status: Married    Spouse name: Not on file  . Number of children: Not on file  . Years of education: Not on file  . Highest education level: Not on file  Occupational History  . Not on file  Social Needs  . Financial resource strain: Not on file  . Food insecurity:    Worry: Not on file    Inability: Not on file  . Transportation needs:    Medical: Not on file    Non-medical: Not on file  Tobacco Use  . Smoking status: Former Smoker    Packs/day: 2.00    Years: 30.00    Pack years: 60.00    Types: Cigarettes    Last attempt to quit: 10/25/2006    Years since quitting: 11.0  . Smokeless tobacco: Never Used  Substance and Sexual Activity  . Alcohol use: Not Currently  . Drug use: Never  . Sexual activity: Not Currently  Lifestyle  . Physical activity:    Days per week: Not on file    Minutes per session: Not on file  . Stress: Not on file  Relationships  . Social connections:    Talks on phone: Not on file    Gets together: Not on file    Attends religious service: Not on file    Active member of club or organization: Not on file    Attends meetings of clubs or organizations: Not on file    Relationship status: Not on file   Other Topics Concern  . Not on file  Social History Narrative  . Not on file     Family History: The patient's family history is not on file.  ROS:   Please see the history of present illness.    ROS  All other systems reviewed and negative.   EKGs/Labs/Other Studies Reviewed:    The following studies were reviewed today:  Hospital notes, 2D echo  EKG:  EKG is  ordered today and showed sinus bradycardia at 59 bpm with nonspecific ST ab normality.  Recent Labs: 10/07/2017: TSH 2.315 10/08/2017: ALT 64 10/17/2017: B Natriuretic Peptide 606.4 10/18/2017: Hemoglobin 11.4; Platelets 239 10/23/2017: Magnesium 2.2 10/26/2017: BUN 14; Creatinine, Ser 1.14; Potassium 3.1; Sodium 141   Recent Lipid Panel No results found for: CHOL, TRIG, HDL, CHOLHDL, VLDL, LDLCALC, LDLDIRECT  Physical Exam:    VS:  BP 124/70   Pulse (!) 59   Ht 5\' 4"  (1.626 m)   Wt 290 lb 12.8 oz (131.9 kg)   SpO2 96%   BMI 49.92 kg/m     Wt Readings from Last 3 Encounters:  11/03/17 290 lb 12.8 oz (131.9 kg)  10/26/17 286 lb (129.7 kg)  10/12/17 290 lb 1.6 oz (131.6 kg)     GEN:  Well nourished, well developed in no acute distress HEENT: Normal NECK: No JVD; No carotid bruits LYMPHATICS: No lymphadenopathy CARDIAC: RRR, no murmurs, rubs, gallops RESPIRATORY:  Clear to auscultation without rales, wheezing or rhonchi  ABDOMEN: Soft, non-tender, increased abdominal fullness MUSCULOSKELETAL: 1-2+ lower extremity edema; No deformity  SKIN: Warm and dry NEUROLOGIC:  Alert and oriented x 3 PSYCHIATRIC:  Normal affect   ASSESSMENT:    1. PAF (paroxysmal atrial fibrillation) (HCC)   2. Essential hypertension   3. DCM (dilated cardiomyopathy) (HCC)   4. Chronic systolic CHF (congestive heart failure) (HCC)    PLAN:    In order of problems listed above:  1.  PAF - she is status post TEE/cardioversion on 10/11/2017.  Then several days later reverted back to atrial fibrillation with RVR and was admitted to  the ER and started on IV Cardizem drip and amiodarone drip.  She subsequently spontaneously converted to sinus rhythm.  She was placed on oral amiodarone.  She is maintaining normal sinus rhythm on exam today.  She will continue on apixaban 5 mg twice daily and carvedilol 25 mg twice daily.  I will decrease her amiodarone to 200 mg daily.  2.  HTN -BP is controlled on exam today.  She will continue on carvedilol 25 mg twice daily, losartan 25 mg daily.  3.  DCM -EF 45 to 50% with diffuse hypokinesis by echo 10/08/2017.  This is likely tachycardia induced.  We will repeat 2D echocardiogram in 2 months to reassess LV function after restoring sinus rhythm  4.  Chronic systolic CHF -she appears volume overloaded on exam today.  Her weight is stable at home at 285 pounds.  It was 287 pounds in the hospital on discharge and she is 290 pounds today.  This is likely just variation in scales.  She has no crackles in her lungs but her legs are swollen and she appears to be retaining fluid in her abdomen as well.  I am questioning whether she is actually absorbing the Lasix that she is now up to 80 mg twice daily.  I am going to stop her Lasix and start her on Demadex 40 mg twice daily.  I will check a bmet today and then one in a week.  She will also continue on ARB and beta-blocker.  She is going to follow-up with 1 of my extenders or partners in 1 week to make sure that her fluid is controlled after changing to [facility].  She will have a bmet at that time as well.  I again stressed the importance of following a 2 g sodium no added salt  diet.  She is also restricting the amount of fluid she drinks daily.  I have asked her to call if she gains more than 3 pounds in a day or 5 pounds in a week.  Medication Adjustments/Labs and Tests Ordered: Current medicines are reviewed at length with the patient today.  Concerns regarding medicines are outlined above.  Orders Placed This Encounter  Procedures  . EKG 12-Lead   No  orders of the defined types were placed in this encounter.   Signed, Armanda Magic, MD  11/03/2017 11:43 AM    [facility]

## 2017-11-03 NOTE — Patient Instructions (Signed)
Medication Instructions:  Your physician has recommended you make the following change in your medication:  STOP: Lasix  START Demadex 40 mg two times a day  DECREASE: Amiodarone to 200 mg once a day. Continue to take a 0.5 tablet of your 400 mg tablets and call pharmacy for refill for 200 mg tablets.    If you need a refill on your cardiac medications, please contact your pharmacy first.  Labwork: Today for kidney function test (BMET) and Magnesium  Your physician recommends that you return for lab work in: 1 week for repeat BMET   Testing/Procedures: None ordered   Follow-Up: Your physician recommends that you schedule a follow-up appointment in: 1 week with PA or physician on Dr. Norris Crossurner's team  Your physician recommends that you schedule a follow-up appointment in: 1 month with Dr. Mayford Knifeurner   Any Other Special Instructions Will Be Listed Below (If Applicable).   [facility] you [facility]    [name] PeroneRena Ferrah Panagopoulos, RN  [phone]  If you need a refill on your cardiac medications before your next appointment, please call your pharmacy.

## 2017-11-06 ENCOUNTER — Telehealth: Payer: Self-pay | Admitting: Cardiology

## 2017-11-06 NOTE — Telephone Encounter (Signed)
New message ° ° ° °Patient calling for lab results °

## 2017-11-06 NOTE — Telephone Encounter (Signed)
Called pt and left message for pt to call back to get lab results per Dr. Mayford Knifeurner.

## 2017-11-07 ENCOUNTER — Telehealth: Payer: Self-pay | Admitting: Cardiology

## 2017-11-07 NOTE — Telephone Encounter (Signed)
Pt states she is no longer on supplemental oxygen and would like to have her tank picked up. Per 10/12/17 discharge note You will receive home health PT and OT services and continue using oxygen at 2 liters until you follow up with your PCP, which you should schedule in the next 1-2 weeks. I advised pt to follow up with her primary MD for orders to d/c oxygen. She verbalized understanding and thankful for the call

## 2017-11-07 NOTE — Telephone Encounter (Signed)
I spoke with patient and made aware of lab results. She stated understanding and thankful for the call

## 2017-11-07 NOTE — Telephone Encounter (Signed)
New Message   Patient states that when she was released from the hospital she was told that she no longer needed the oxygen. She is asking that [facility] be notified by the provider. Because they will not pickup without an order from the provider. Please call.

## 2017-11-08 ENCOUNTER — Other Ambulatory Visit: Payer: Medicare Other | Admitting: *Deleted

## 2017-11-08 ENCOUNTER — Encounter (INDEPENDENT_AMBULATORY_CARE_PROVIDER_SITE_OTHER): Payer: Self-pay

## 2017-11-08 ENCOUNTER — Encounter: Payer: Self-pay | Admitting: Physician Assistant

## 2017-11-08 ENCOUNTER — Ambulatory Visit (INDEPENDENT_AMBULATORY_CARE_PROVIDER_SITE_OTHER): Payer: Medicare Other | Admitting: Physician Assistant

## 2017-11-08 VITALS — BP 128/62 | HR 58 | Ht 64.0 in | Wt 287.0 lb

## 2017-11-08 DIAGNOSIS — I5022 Chronic systolic (congestive) heart failure: Secondary | ICD-10-CM

## 2017-11-08 DIAGNOSIS — I420 Dilated cardiomyopathy: Secondary | ICD-10-CM

## 2017-11-08 DIAGNOSIS — I10 Essential (primary) hypertension: Secondary | ICD-10-CM | POA: Diagnosis not present

## 2017-11-08 DIAGNOSIS — I4819 Other persistent atrial fibrillation: Secondary | ICD-10-CM

## 2017-11-08 DIAGNOSIS — I481 Persistent atrial fibrillation: Secondary | ICD-10-CM | POA: Diagnosis not present

## 2017-11-08 NOTE — Progress Notes (Signed)
Cardiology Office Note:    Date:  11/08/2017   ID:  [name] [name], DOB [birth year], MRN [id number]  PCP:  Lelon Huh [name], MD  Cardiologist:  Armanda Magic, MD   Referring MD: Dellinger, Romelle Starcher*   Chief Complaint  Patient presents with  . Follow-up    CHF    History of Present Illness:    [name] [name] is a 77 y.o. female with persistent atrial fibrillation, systolic heart failure, hypertension, polymyalgia rheumatica on chronic prednisone therapy.  She was admitted in late May 2019 with congestive heart failure and atrial fibrillation with rapid ventricular rate requiring cardioversion.  She was readmitted 6/4-6/13 with recurrent heart failure and atrial fibrillation.  She was therefore placed on amiodarone and converted to normal sinus rhythm.  She was last seen by Dr. Mayford Knife 11/03/2017.  She was noted to be volume overloaded.  Her Lasix was transitioned to torsemide.  [name] [name] returns for follow up on congestive heart failure.  Her weight is down 5 lbs on her scales at home.  She notes chronic leg swelling for years without change.  She denies paroxysmal nocturnal dyspnea.  She denies chest pain, syncope.  She is limited by knee arthritis.  She feels her breathing is overall stable.   Prior CV studies:   The following studies were reviewed today:  Echo 10/08/2017 Mild LVH, EF 45-50, diffuse HK, aortic sclerosis without stenosis, moderate MR  Echo 07/27/2017 [facility]) Trace MR, mild aortic sclerosis, EF 50-55, inferior HK  Past Medical History:  Diagnosis Date  . Arthritis    "inflammatory" (10/17/2017)  . Female bladder prolapse   . History of blood transfusion 2008   "related to OR"  . Hypertension   . On home oxygen therapy    "2L; 24/7" (10/17/2017)  . PAF (paroxysmal atrial fibrillation) (HCC) paf  . Panic attacks 1991   "when I lost my son"  . Peripheral neuropathy    "BLE" (10/17/2017)  . Pneumonia 1943   "both lungs"  . Polymyalgia  (HCC)   . PONV (postoperative nausea and vomiting)   . Recurrent UTI (urinary tract infection)    Surgical Hx: The patient  has a past surgical history that includes Abdominal hysterectomy; TEE without cardioversion (N/A, 10/11/2017); Cardioversion (N/A, 10/11/2017); Laparoscopic cholecystectomy; Total knee arthroplasty (Left, 2008); Joint replacement; and Cataract extraction w/ intraocular lens  implant, bilateral.   Current Medications: Current Meds  Medication Sig  . alendronate (FOSAMAX) 70 MG tablet Take 70 mg by mouth once a week. Take with a full glass of water on an empty stomach.  Marland Kitchen amiodarone (PACERONE) 200 MG tablet Take 1 tablet (200 mg total) by mouth daily.  Marland Kitchen apixaban (ELIQUIS) 5 MG TABS tablet Take 1 tablet (5 mg total) by mouth 2 (two) times daily.  . carvedilol (COREG) 25 MG tablet Take 1 tablet (25 mg total) by mouth 2 (two) times daily with a meal.  . cholecalciferol (VITAMIN D) 1000 units tablet Take 2,000 Units by mouth daily.  . colestipol (COLESTID) 1 g tablet Take 1 g by mouth daily.   . ferrous sulfate 325 (65 FE) MG tablet Take 325 mg by mouth daily with breakfast.  . folic acid (FOLVITE) 1 MG tablet Take 1 mg by mouth daily.  Marland Kitchen gabapentin (NEURONTIN) 300 MG capsule Take 300 mg by mouth at bedtime.  Marland Kitchen HYDROcodone-acetaminophen (NORCO) 10-325 MG tablet Take 1 tablet by mouth every 6 (six) hours as needed for pain.  Marland Kitchen losartan (  COZAAR) 25 MG tablet Take 1 tablet (25 mg total) by mouth daily.  . methotrexate (RHEUMATREX) 2.5 MG tablet Take 15 mg by mouth once a week.  . Potassium Chloride ER 20 MEQ TBCR Take 40 mEq by mouth 2 (two) times daily.  . predniSONE (DELTASONE) 5 MG tablet Take 10 mg by mouth daily with breakfast.   . torsemide (DEMADEX) 20 MG tablet TAKE 2 TABLETS(40 MG) BY MOUTH TWICE DAILY     Allergies:   Fluoxetine; Adhesive [tape]; Demerol [meperidine hcl]; and Sulfa antibiotics   Social History   Tobacco Use  . Smoking status: Former Smoker     Packs/day: 2.00    Years: 30.00    Pack years: 60.00    Types: Cigarettes    Last attempt to quit: 10/25/2006    Years since quitting: 11.0  . Smokeless tobacco: Never Used  Substance Use Topics  . Alcohol use: Not Currently  . Drug use: Never     Family Hx: The patient's family history is not on file.  ROS:   Please see the history of present illness.    ROS All other systems reviewed and are negative.   EKGs/Labs/Other Test Reviewed:    EKG:  EKG is  ordered today.  The ekg ordered today demonstrates sinus bradycardia, heart rate 58, leftward axis, QTC 433  Recent Labs: 10/07/2017: TSH 2.315 10/08/2017: ALT 64 10/17/2017: B Natriuretic Peptide 606.4 10/18/2017: Hemoglobin 11.4; Platelets 239 11/03/2017: BUN 12; Creatinine, Ser 1.17; Magnesium 2.1; Potassium 4.5; Sodium 137   Recent Lipid Panel No results found for: CHOL, TRIG, HDL, CHOLHDL, LDLCALC, LDLDIRECT  Physical Exam:    VS:  BP 128/62   Pulse (!) 58   Ht 5\' 4"  (1.626 m)   Wt 287 lb (130.2 kg)   SpO2 97%   BMI 49.26 kg/m     Wt Readings from Last 3 Encounters:  11/08/17 287 lb (130.2 kg)  11/03/17 290 lb 12.8 oz (131.9 kg)  10/26/17 286 lb (129.7 kg)     Physical Exam  Constitutional: She is oriented to person, place, and time. She appears well-developed and well-nourished. No distress.  HENT:  Head: Normocephalic and atraumatic.  Neck: Neck supple.  Cardiovascular: Normal rate and regular rhythm.  Murmur heard.  Systolic murmur is present with a grade of 2/6 at the upper right sternal border. Pulmonary/Chest: Effort normal. She has no rales.  Abdominal: Soft.  Musculoskeletal: She exhibits edema (2+ bilat LE edema).  Neurological: She is alert and oriented to person, place, and time.  Skin: Skin is warm and dry.    ASSESSMENT & PLAN:    Chronic systolic CHF (congestive heart failure) (HCC) EF 45-50 by echocardiogram May 2019.  She also has moderate mitral regurgitation.  It is felt that her  cardiomyopathy is related to tachycardia.  Plan is to eventually repeat echocardiogram to ensure that her LV function has returned to normal.  Her exam is difficult due to her body habitus.  However, her weight is down on her scales at home.  She feels that her breathing is stable.  BMET will be obtained to follow-up on renal function and potassium given the adjustment of diuretics.  -Continue current therapy  -BMET today  Persistent atrial fibrillation (HCC) Maintaining normal sinus rhythm on amiodarone and carvedilol.  She is tolerating anticoagulation with Apixaban.  Continue current management.  Essential hypertension The patient's blood pressure is controlled on her current regimen.  Continue current therapy.    Dispo:  Return  in about 5 weeks (around 12/14/2017) for Scheduled Follow Up w/ Dr. Mayford Knife.   Medication Adjustments/Labs and Tests Ordered: Current medicines are reviewed at length with the patient today.  Concerns regarding medicines are outlined above.  Tests Ordered: Orders Placed This Encounter  Procedures  . EKG 12-Lead   Medication Changes: No orders of the defined types were placed in this encounter.   Signed, Tereso Newcomer, PA-C  11/08/2017 1:16 PM    [facility] [address] [place], [place], [place]  [address] Phone: [phone]; Fax: [phone]

## 2017-11-08 NOTE — Patient Instructions (Signed)
Medication Instructions:  1. Your physician recommends that you continue on your current medications as directed. Please refer to the Current Medication list given to you today.   Labwork: BMET ALREADY UNDER DR. Mayford KnifeURNER   Testing/Procedures: NONE ORDERED TODAY  Follow-Up: DR. Mayford KnifeURNER 12/14/17 @ 1:40 PM   Any Other Special Instructions Will Be Listed Below (If Applicable).     If you need a refill on your cardiac medications before your next appointment, please call your pharmacy.

## 2017-11-09 LAB — BASIC METABOLIC PANEL
BUN / CREAT RATIO: 11 — AB (ref 12–28)
BUN: 13 mg/dL (ref 8–27)
CALCIUM: 9.1 mg/dL (ref 8.7–10.3)
CO2: 28 mmol/L (ref 20–29)
Chloride: 95 mmol/L — ABNORMAL LOW (ref 96–106)
Creatinine, Ser: 1.15 mg/dL — ABNORMAL HIGH (ref 0.57–1.00)
GFR calc Af Amer: 53 mL/min/{1.73_m2} — ABNORMAL LOW (ref >59)
GFR calc non Af Amer: 46 mL/min/{1.73_m2} — ABNORMAL LOW (ref >59)
GLUCOSE: 126 mg/dL — AB (ref 65–99)
Potassium: 4.4 mmol/L (ref 3.5–5.2)
Sodium: 139 mmol/L (ref 134–144)

## 2017-11-15 ENCOUNTER — Other Ambulatory Visit: Payer: Self-pay | Admitting: Cardiology

## 2017-11-15 MED ORDER — CARVEDILOL 25 MG PO TABS: 25.0000 mg | ORAL_TABLET | Freq: Two times a day (BID) | ORAL | 3 refills | Status: DC

## 2017-11-15 MED ORDER — TORSEMIDE 20 MG PO TABS: ORAL_TABLET | ORAL | 2 refills | Status: DC

## 2017-11-15 MED ORDER — APIXABAN 5 MG PO TABS: 5.0000 mg | ORAL_TABLET | Freq: Two times a day (BID) | ORAL | 10 refills | Status: DC

## 2017-11-15 NOTE — Telephone Encounter (Signed)
Pt is a 77 yr old female who saw PA on 11/08/17. Weight at that visit was 130.2Kg. SCr on 11/08/17 was 1.15. Will refill Eliquis 5mg   BID.

## 2017-12-14 ENCOUNTER — Ambulatory Visit: Payer: Medicare Other | Admitting: Cardiology

## 2017-12-14 ENCOUNTER — Encounter: Payer: Self-pay | Admitting: Cardiology

## 2017-12-14 ENCOUNTER — Encounter (INDEPENDENT_AMBULATORY_CARE_PROVIDER_SITE_OTHER): Payer: Self-pay

## 2017-12-14 ENCOUNTER — Telehealth: Payer: Self-pay | Admitting: *Deleted

## 2017-12-14 VITALS — BP 128/70 | HR 70 | Ht 64.0 in | Wt 290.8 lb

## 2017-12-14 DIAGNOSIS — I5022 Chronic systolic (congestive) heart failure: Secondary | ICD-10-CM | POA: Diagnosis not present

## 2017-12-14 DIAGNOSIS — I10 Essential (primary) hypertension: Secondary | ICD-10-CM | POA: Diagnosis not present

## 2017-12-14 DIAGNOSIS — I420 Dilated cardiomyopathy: Secondary | ICD-10-CM

## 2017-12-14 DIAGNOSIS — I480 Paroxysmal atrial fibrillation: Secondary | ICD-10-CM | POA: Diagnosis not present

## 2017-12-14 DIAGNOSIS — I340 Nonrheumatic mitral (valve) insufficiency: Secondary | ICD-10-CM

## 2017-12-14 HISTORY — DX: Nonrheumatic mitral (valve) insufficiency: I34.0

## 2017-12-14 MED ORDER — TORSEMIDE 20 MG PO TABS
ORAL_TABLET | ORAL | 3 refills | Status: DC
Start: 2017-12-14 — End: 2018-01-24

## 2017-12-14 NOTE — Progress Notes (Signed)
Cardiology Office Note:    Date:  12/14/2017   ID:  [name] [name], DOB 26-Jun-1940, MRN [id number]  PCP:  Frederic Jericho Estevan Ryder., MD  Cardiologist:  Fransico Him, MD    Referring MD: Dellinger, Estevan Ryder*   Chief Complaint  Patient presents with  . Atrial Fibrillation  . Congestive Heart Failure    History of Present Illness:    [name] [name] is a 77 y.o. female with a hx of paroxysmal atrial fibrillation on chronic anti-coagulation with Eliquis, hypertension, chronic systolic CHF, polymyalgia rheumatica on methotrexate and prednisone.  She recently was hospitalized on 4 June with worsening dyspnea on exertion and palpitations.  She had been in the hospital 1 week prior with new onset atrial fibrillation with RVR and underwent TEE /cardioversion to normal sinus rhythm.  She was found to be back in atrial fibrillation with RVR in the ER with heart rates of 110 to 140 bpm.  BNP was elevated at 606 and chest x-ray was consistent with CHF.  She was started on IV Cardizem drip and then started on amiodarone.  She converted spontaneously on amnio to sinus rhythm and she was switched to oral amiodarone.  She was discharged home on Lasix 80 mg in the morning 40 mg in the evening.  2D echocardiogram 10/08/2017 showed mildly reduced LV function with EF 45 to 50% with diffuse hypokinesis and moderate mitral regurgitation.  TEE on 10/11/2017 prior to cardioversion showed moderate to severe MR.  I saw her back in June of this year and she was maintaining sinus rhythm and amiodarone was decreased to 200 mg daily.  She was volume overloaded on exam though it was changed from Lasix twice daily to Demadex 40 mg twice daily.  Was seen back by Richardson Dopp, PA a few weeks later and her weight was down 5 pounds.  No change was made to her medications.  She is here today for followup and is doing well.  She denies any chest pain or pressure,  PND, orthopnea, dizziness or syncope.  She has chronic dyspnea on  exertion that she attributes to being overweight and very deconditioned from not exercising.  She is very sedentary.  She also occasionally has palpitations which she says is only a skipped beat for a few in a row and then resolves on its own.  She does not think she has had any A. fib.  She is compliant with her meds and is tolerating meds with no SE.    Past Medical History:  Diagnosis Date  . Arthritis    "inflammatory" (10/17/2017)  . Female bladder prolapse   . History of blood transfusion 2008   "related to OR"  . Hypertension   . Mitral regurgitation 12/14/2017   Moderate to severe by TEE/DCCV 09-2017  . On home oxygen therapy    "2L; 24/7" (10/17/2017)  . PAF (paroxysmal atrial fibrillation) (HCC) paf  . Panic attacks 1991   "when I lost my son"  . Peripheral neuropathy    "BLE" (10/17/2017)  . Pneumonia 1943   "both lungs"  . Polymyalgia ([place])   . PONV (postoperative nausea and vomiting)   . Recurrent UTI (urinary tract infection)     Past Surgical History:  Procedure Laterality Date  . ABDOMINAL HYSTERECTOMY    . CARDIOVERSION N/A 10/11/2017   Procedure: CARDIOVERSION;  Surgeon: Nahser, Wonda Cheng, MD;  Location: [facility];  Service: Cardiovascular;  Laterality: N/A;  . CATARACT EXTRACTION W/ INTRAOCULAR LENS  IMPLANT, BILATERAL    .  JOINT REPLACEMENT    . LAPAROSCOPIC CHOLECYSTECTOMY    . TEE WITHOUT CARDIOVERSION N/A 10/11/2017   Procedure: TRANSESOPHAGEAL ECHOCARDIOGRAM (TEE);  Surgeon: Thayer Headings, MD;  [facility] ENDOSCOPY;  Service: Cardiovascular;  Laterality: N/A;  . TOTAL KNEE ARTHROPLASTY Left 2008    Current Medications: Current Meds  Medication Sig  . alendronate (FOSAMAX) 70 MG tablet Take 70 mg by mouth once a week. Take with a full glass of water on an empty stomach.  Marland Kitchen amiodarone (PACERONE) 200 MG tablet Take 1 tablet (200 mg total) by mouth daily.  Marland Kitchen apixaban (ELIQUIS) 5 MG TABS tablet Take 1 tablet (5 mg total) by mouth 2 (two) times daily.  .  carvedilol (COREG) 25 MG tablet TAKE 1 TABLET(25 MG) BY MOUTH TWICE DAILY WITH A MEAL  . carvedilol (COREG) 25 MG tablet Take 1 tablet (25 mg total) by mouth 2 (two) times daily with a meal.  . cholecalciferol (VITAMIN D) 1000 units tablet Take 2,000 Units by mouth daily.  . colestipol (COLESTID) 1 g tablet Take 1 g by mouth daily.   Marland Kitchen ELIQUIS 5 MG TABS tablet TAKE 1 TABLET(5 MG) BY MOUTH TWICE DAILY  . ferrous sulfate 325 (65 FE) MG tablet Take 325 mg by mouth daily with breakfast.  . folic acid (FOLVITE) 1 MG tablet Take 1 mg by mouth daily.  Marland Kitchen gabapentin (NEURONTIN) 300 MG capsule Take 300 mg by mouth at bedtime.  Marland Kitchen HYDROcodone-acetaminophen (NORCO) 10-325 MG tablet Take 1 tablet by mouth every 6 (six) hours as needed for pain.  Marland Kitchen losartan (COZAAR) 25 MG tablet TAKE 1 TABLET(25 MG) BY MOUTH DAILY  . methotrexate (RHEUMATREX) 2.5 MG tablet Take 15 mg by mouth once a week.  . potassium chloride (MICRO-K) 10 MEQ CR capsule Take 50 mEq by mouth 2 (two) times daily.  . predniSONE (DELTASONE) 5 MG tablet Take 10 mg by mouth daily with breakfast.   . torsemide (DEMADEX) 20 MG tablet TAKE 2 TABLETS(40 MG) BY MOUTH TWICE DAILY     Allergies:   Fluoxetine; Adhesive [tape]; Demerol [meperidine hcl]; and Sulfa antibiotics   Social History   Socioeconomic History  . Marital status: Married    Spouse name: Not on file  . Number of children: Not on file  . Years of education: Not on file  . Highest education level: Not on file  Occupational History  . Not on file  Social Needs  . Financial resource strain: Not on file  . Food insecurity:    Worry: Not on file    Inability: Not on file  . Transportation needs:    Medical: Not on file    Non-medical: Not on file  Tobacco Use  . Smoking status: Former Smoker    Packs/day: 2.00    Years: 30.00    Pack years: 60.00    Types: Cigarettes    Last attempt to quit: 10/25/2006    Years since quitting: 11.1  . Smokeless tobacco: Never Used    Substance and Sexual Activity  . Alcohol use: Not Currently  . Drug use: Never  . Sexual activity: Not Currently  Lifestyle  . Physical activity:    Days per week: Not on file    Minutes per session: Not on file  . Stress: Not on file  Relationships  . Social connections:    Talks on phone: Not on file    Gets together: Not on file    Attends religious service: Not on file  Active member of club or organization: Not on file    Attends meetings of clubs or organizations: Not on file    Relationship status: Not on file  Other Topics Concern  . Not on file  Social History Narrative  . Not on file     Family History: The patient's family history is not on file.  ROS:   Please see the history of present illness.    ROS  All other systems reviewed and negative.   EKGs/Labs/Other Studies Reviewed:    The following studies were reviewed today: none  EKG:  EKG is not ordered today.    Recent Labs: 10/07/2017: TSH 2.315 10/08/2017: ALT 64 10/17/2017: B Natriuretic Peptide 606.4 10/18/2017: Hemoglobin 11.4; Platelets 239 11/03/2017: Magnesium 2.1 11/08/2017: BUN 13; Creatinine, Ser 1.15; Potassium 4.4; Sodium 139   Recent Lipid Panel No results found for: CHOL, TRIG, HDL, CHOLHDL, VLDL, LDLCALC, LDLDIRECT  Physical Exam:    VS:  BP 128/70 (BP Location: Right Arm, Patient Position: Sitting, Cuff Size: Large)   Pulse 70   Ht _0  (1.626 m)   Wt 290 lb 12.8 oz (131.9 kg)   SpO2 95%   BMI 49.92 kg/m     Wt Readings from Last 3 Encounters:  12/14/17 290 lb 12.8 oz (131.9 kg)  11/08/17 287 lb (130.2 kg)  11/03/17 290 lb 12.8 oz (131.9 kg)     GEN:  Well nourished, well developed in no acute distress HEENT: Normal NECK: No JVD; No carotid bruits LYMPHATICS: No lymphadenopathy CARDIAC: RRR, no murmurs, rubs, gallops RESPIRATORY:  Clear to auscultation without rales, wheezing or rhonchi  ABDOMEN: Soft, non-tender, non-distended MUSCULOSKELETAL:  No edema; No deformity   SKIN: Warm and dry NEUROLOGIC:  Alert and oriented x 3 PSYCHIATRIC:  Normal affect   ASSESSMENT:    1. PAF (paroxysmal atrial fibrillation) ([place])   2. Essential hypertension   3. DCM (dilated cardiomyopathy) ([place])   4. Chronic systolic CHF (congestive heart failure) (HCC)    PLAN:    In order of problems listed above:  1.  Paroxysmal atrial fibrillation -she is maintaining normal sinus rhythm and will continue on amiodarone 200 mg daily, carvedilol 25 mg twice daily and Eliquis 5 mg twice daily.  Will check TSH and LFTs today.  Creatinine was stable at 1.15 and potassium 4.4 on 11/08/2017.  I will check PFTs with DLCO.  I recommended that we proceed with a sleep study to rule out sleep apnea given her atrial fibrillation.  2.  Hypertension -BP is well controlled on exam today.  She will continue on carvedilol 25 mg twice daily and losartan 25 mg daily.  3.  Dilated cardiomyopathy -tachycardia mediated - EF 40 to 45% on echo 10/08/2017.  Now that she has been maintaining sinus rhythm for over 2 months I will repeat 2D echocardiogram to see if LV function is improved.  She will continue on ARB and beta-blocker.  4.  Chronic systolic CHF -she continues to have dyspnea on exertion although I think this is more related to sedentary state, deconditioning obesity then to volume overload.  She has chronic lower extremity edema likely exacerbated by her morbid obesity and sitting all the time.  I have instructed her to increase her Demadex to 60 mg every morning and 40 mg every afternoon.  I will repeat a be met in 1 week.    5.  Moderate to severe mitral regurgitation -as noted the time of her TEE cardioversion on 10/11/2017.  She  was in atrial fibrillation at that time.  I am going to repeat a 2D echo now to see if her MR remains moderate to severe.  This could be contributing to her atrial fibrillation.   Medication Adjustments/Labs and Tests Ordered: Current medicines are reviewed at length with  the patient today.  Concerns regarding medicines are outlined above.  No orders of the defined types were placed in this encounter.  No orders of the defined types were placed in this encounter.   Signed, Fransico Him, MD  12/14/2017 2:26 PM    [place]

## 2017-12-14 NOTE — Patient Instructions (Signed)
Medication Instructions:  Your physician has recommended you make the following change in your medication:  1.) CHANGE TORSEMIDE (demadex) to 60 mg (3 tabs) every morning and 40 mg (2 tabs) every evening  Labwork: Your physician recommends that you return for lab work in: 1 week (bmet, LFTs, TSH) IN ONE WEEK  Testing/Procedures: Your physician has requested that you have an echocardiogram. Echocardiography is a painless test that uses sound waves to create images of your heart. It provides your doctor with information about the size and shape of your heart and how well your heart's chambers and valves are working. This procedure takes approximately one hour. There are no restrictions for this procedure.  Your physician has recommended that you have a sleep study. This test records several body functions during sleep, including: brain activity, eye movement, oxygen and carbon dioxide blood levels, heart rate and rhythm, breathing rate and rhythm, the flow of air through your mouth and nose, snoring, body muscle movements, and chest and belly movement.  Your physician has recommended that you have a pulmonary function test. Pulmonary Function Tests are a group of tests that measure how well air moves in and out of your lungs.     Follow-Up: Your physician recommends that you schedule a follow-up appointment in: 2 WEEKS WITH PHYSICIAN EXTENDER ON DR. Norris CrossURNER'S CARE TEAM. Your physician recommends that you schedule a follow-up appointment in: 3 MONTHS WITH DR. Mayford KnifeURNER.   Any Other Special Instructions Will Be Listed Below (If Applicable).     If you need a refill on your cardiac medications before your next appointment, please call your pharmacy.

## 2017-12-14 NOTE — Telephone Encounter (Signed)
-----   Message from Lendon KaMichalene Wilson, RN sent at 12/14/2017  5:08 PM EDT ----- Regarding: sleep study Patient ordered sleep study today by Dr. Mayford Knifeurner for CHF. Thanks,  [employer] Corporation[employer]alene

## 2017-12-18 ENCOUNTER — Telehealth: Payer: Self-pay | Admitting: *Deleted

## 2017-12-18 NOTE — Telephone Encounter (Signed)
-----   Message from Michalene Wilson, RN sent at 12/14/2017  5:08 PM EDT ----- Regarding: sleep study Patient ordered sleep study today by Dr. Turner for CHF. Thanks,  Michalene 

## 2017-12-18 NOTE — Telephone Encounter (Signed)
Staff message sent to [facility] [place] ok to schedule sleep study. Per [employer] no PA is required. Decision [id number].

## 2017-12-21 NOTE — Telephone Encounter (Signed)
Patient is scheduled for lab study on 01/13/18. Patient [facility] Patient understands she will receive a sleep packet in a week or so. Patient understands to call if she does not receive the sleep packet in a timely manner. Patient agrees with treatment and thanked me for call Left detailed message on voicemail with date and time of titration and informed patient to call back to confirm or reschedule.

## 2017-12-22 ENCOUNTER — Other Ambulatory Visit: Payer: Medicare Other | Admitting: *Deleted

## 2017-12-22 DIAGNOSIS — I5022 Chronic systolic (congestive) heart failure: Secondary | ICD-10-CM

## 2017-12-22 DIAGNOSIS — I420 Dilated cardiomyopathy: Secondary | ICD-10-CM

## 2017-12-22 DIAGNOSIS — I480 Paroxysmal atrial fibrillation: Secondary | ICD-10-CM

## 2017-12-22 DIAGNOSIS — I10 Essential (primary) hypertension: Secondary | ICD-10-CM

## 2017-12-23 LAB — BASIC METABOLIC PANEL
BUN/Creatinine Ratio: 13 (ref 12–28)
BUN: 16 mg/dL (ref 8–27)
CO2: 28 mmol/L (ref 20–29)
CREATININE: 1.22 mg/dL — AB (ref 0.57–1.00)
Calcium: 9.1 mg/dL (ref 8.7–10.3)
Chloride: 97 mmol/L (ref 96–106)
GFR calc Af Amer: 49 mL/min/{1.73_m2} — ABNORMAL LOW (ref >59)
GFR, EST NON AFRICAN AMERICAN: 43 mL/min/{1.73_m2} — AB (ref >59)
GLUCOSE: 117 mg/dL — AB (ref 65–99)
POTASSIUM: 4 mmol/L (ref 3.5–5.2)
SODIUM: 142 mmol/L (ref 134–144)

## 2017-12-23 LAB — HEPATIC FUNCTION PANEL
ALBUMIN: 4 g/dL (ref 3.5–4.8)
ALK PHOS: 50 IU/L (ref 39–117)
ALT: 32 IU/L (ref 0–32)
AST: 24 IU/L (ref 0–40)
BILIRUBIN, DIRECT: 0.12 mg/dL (ref 0.00–0.40)
Bilirubin Total: 0.3 mg/dL (ref 0.0–1.2)
TOTAL PROTEIN: 6.4 g/dL (ref 6.0–8.5)

## 2017-12-23 LAB — TSH: TSH: 3.25 u[IU]/mL (ref 0.450–4.500)

## 2017-12-26 ENCOUNTER — Other Ambulatory Visit: Payer: Self-pay | Admitting: *Deleted

## 2017-12-26 ENCOUNTER — Other Ambulatory Visit: Payer: Self-pay

## 2017-12-26 ENCOUNTER — Ambulatory Visit (HOSPITAL_COMMUNITY): Payer: Medicare Other | Attending: Cardiology

## 2017-12-26 DIAGNOSIS — I420 Dilated cardiomyopathy: Secondary | ICD-10-CM | POA: Diagnosis present

## 2017-12-26 DIAGNOSIS — I5022 Chronic systolic (congestive) heart failure: Secondary | ICD-10-CM | POA: Insufficient documentation

## 2017-12-26 DIAGNOSIS — I480 Paroxysmal atrial fibrillation: Secondary | ICD-10-CM | POA: Insufficient documentation

## 2017-12-26 DIAGNOSIS — I340 Nonrheumatic mitral (valve) insufficiency: Secondary | ICD-10-CM | POA: Insufficient documentation

## 2017-12-26 DIAGNOSIS — I110 Hypertensive heart disease with heart failure: Secondary | ICD-10-CM | POA: Insufficient documentation

## 2017-12-26 DIAGNOSIS — I4891 Unspecified atrial fibrillation: Secondary | ICD-10-CM | POA: Insufficient documentation

## 2017-12-26 DIAGNOSIS — I272 Pulmonary hypertension, unspecified: Secondary | ICD-10-CM | POA: Diagnosis not present

## 2017-12-26 DIAGNOSIS — I10 Essential (primary) hypertension: Secondary | ICD-10-CM

## 2018-01-02 ENCOUNTER — Encounter: Payer: Self-pay | Admitting: Physician Assistant

## 2018-01-02 ENCOUNTER — Ambulatory Visit: Payer: Medicare Other | Admitting: Physician Assistant

## 2018-01-02 VITALS — BP 98/70 | HR 59 | Ht 64.0 in | Wt 276.4 lb

## 2018-01-02 DIAGNOSIS — I340 Nonrheumatic mitral (valve) insufficiency: Secondary | ICD-10-CM

## 2018-01-02 DIAGNOSIS — I480 Paroxysmal atrial fibrillation: Secondary | ICD-10-CM

## 2018-01-02 DIAGNOSIS — I420 Dilated cardiomyopathy: Secondary | ICD-10-CM

## 2018-01-02 DIAGNOSIS — I5022 Chronic systolic (congestive) heart failure: Secondary | ICD-10-CM

## 2018-01-02 DIAGNOSIS — I10 Essential (primary) hypertension: Secondary | ICD-10-CM

## 2018-01-02 NOTE — Patient Instructions (Addendum)
Medication Instructions:  Your physician recommends that you continue on your current medications as directed. Please refer to the Current Medication list given to you today.   Labwork: None ordered  Testing/Procedures: None ordered  Follow-Up: 1. KEEP APPOINTMENT WITH DR. Excell SeltzerOOPER AUGUST 29TH @ 9 AM  2. KEEP APPOINTMENT WITH DR. Mayford KnifeURNER ON October 30TH @ 11 AM.  Any Other Special Instructions Will Be Listed Below (If Applicable).     If you need a refill on your cardiac medications before your next appointment, please call your pharmacy.

## 2018-01-02 NOTE — Progress Notes (Signed)
Cardiology Office Note    Date:  01/02/2018   ID:  [name] RocheBetty [name], DOB 08/24/1940, MRN [id number]  PCP:  Lelon Huhellinger, Romelle Starcherobert C. Jr., MD  Cardiologist: Armanda Magicraci Turner, MD  Chief Complaint  Patient presents with  . Follow-up    History of Present Illness:  [name] [name] is a 77 y.o. female with history of PAF on Eliquis status post TEE/cardioversion 09/2017 readmitted with A. fib and RVR with CHF 10/2017 started on amiodarone and converted to normal sinus rhythm, hypertension, chronic systolic CHF 2D echo 10/08/2017 EF 45 to 50% with diffuse hypokinesis and moderate MR, TEE 10/11/2017 prior to cardioversion showed moderate to severe MR, polymyalgia rheumatica on methotrexate and prednisone.  Patient last saw Dr. Mayford Knifeurner 12/14/2017 at which time she was in normal sinus rhythm.  She ordered a sleep study and PFTs as well as 2D echo since she was in normal sinus rhythm.  2D echo showed normal LV systolic function with moderate diastolic dysfunction grade 2 and moderate to severe MR with mild pulmonary hypertension.  Dr. Mayford Knifeurner had Dr. Excell Seltzerooper and the structural heart team review her echo and he wants to see her next week.  Patient comes in today accompanied by her husband.  Overall she is feeling well.  She has chronic dyspnea on exertion that is unchanged.  Chronic edema that is unchanged.  Past Medical History:  Diagnosis Date  . Arthritis    "inflammatory" (10/17/2017)  . Female bladder prolapse   . History of blood transfusion 2008   "related to OR"  . Hypertension   . Mitral regurgitation 12/14/2017   Moderate to severe by TEE/DCCV 09-2017  . On home oxygen therapy    "2L; 24/7" (10/17/2017)  . PAF (paroxysmal atrial fibrillation) (HCC) paf  . Panic attacks 1991   "when I lost my son"  . Peripheral neuropathy    "BLE" (10/17/2017)  . Pneumonia 1943   "both lungs"  . Polymyalgia (HCC)   . PONV (postoperative nausea and vomiting)   . Recurrent UTI (urinary tract infection)     Past Surgical  History:  Procedure Laterality Date  . ABDOMINAL HYSTERECTOMY    . CARDIOVERSION N/A 10/11/2017   Procedure: CARDIOVERSION;  Surgeon: Nahser, Deloris PingPhilip J, MD;  [facility];  Service: Cardiovascular;  Laterality: N/A;  . CATARACT EXTRACTION W/ INTRAOCULAR LENS  IMPLANT, BILATERAL    . JOINT REPLACEMENT    . LAPAROSCOPIC CHOLECYSTECTOMY    . TEE WITHOUT CARDIOVERSION N/A 10/11/2017   Procedure: TRANSESOPHAGEAL ECHOCARDIOGRAM (TEE);  Surgeon: Vesta MixerNahser, Philip J, MD;  [facility];  Service: Cardiovascular;  Laterality: N/A;  . TOTAL KNEE ARTHROPLASTY Left 2008    Current Medications: Current Meds  Medication Sig  . alendronate (FOSAMAX) 70 MG tablet Take 70 mg by mouth once a week. Take with a full glass of water on an empty stomach.  Marland Kitchen. amiodarone (PACERONE) 200 MG tablet Take 1 tablet (200 mg total) by mouth daily.  Marland Kitchen. apixaban (ELIQUIS) 5 MG TABS tablet Take 1 tablet (5 mg total) by mouth 2 (two) times daily.  . carvedilol (COREG) 25 MG tablet TAKE 1 TABLET(25 MG) BY MOUTH TWICE DAILY WITH A MEAL  . carvedilol (COREG) 25 MG tablet Take 1 tablet (25 mg total) by mouth 2 (two) times daily with a meal.  . cholecalciferol (VITAMIN D) 1000 units tablet Take 2,000 Units by mouth daily.  . colestipol (COLESTID) 1 g tablet Take 1 g by mouth daily.   Marland Kitchen. ELIQUIS 5 MG TABS tablet  TAKE 1 TABLET(5 MG) BY MOUTH TWICE DAILY  . ferrous sulfate 325 (65 FE) MG tablet Take 325 mg by mouth daily with breakfast.  . folic acid (FOLVITE) 1 MG tablet Take 1 mg by mouth daily.  Marland Kitchen gabapentin (NEURONTIN) 300 MG capsule Take 300 mg by mouth at bedtime.  Marland Kitchen HYDROcodone-acetaminophen (NORCO) 10-325 MG tablet Take 1 tablet by mouth every 6 (six) hours as needed for pain.  Marland Kitchen losartan (COZAAR) 25 MG tablet TAKE 1 TABLET(25 MG) BY MOUTH DAILY  . methotrexate (RHEUMATREX) 2.5 MG tablet Take 15 mg by mouth once a week.  . potassium chloride (K-DUR) 10 MEQ tablet Take 10 mEq by mouth 2 (two) times daily. Take (3)  10 mg tablets by mouth in the morning and 2 (10) mg tablets by mouth in the evening for a total of 50 mg per day.  . predniSONE (DELTASONE) 5 MG tablet Take 10 mg by mouth daily with breakfast.   . torsemide (DEMADEX) 20 MG tablet TAKE 3 TABS (60 MG) BY MOUTH EVERY AM, TAKE 2 TABS (40 MG) EVERY PM     Allergies:   Fluoxetine; Adhesive [tape]; Demerol [meperidine hcl]; and Sulfa antibiotics   Social History   Socioeconomic History  . Marital status: Married    Spouse name: Not on file  . Number of children: Not on file  . Years of education: Not on file  . Highest education level: Not on file  Occupational History  . Not on file  Social Needs  . Financial resource strain: Not on file  . Food insecurity:    Worry: Not on file    Inability: Not on file  . Transportation needs:    Medical: Not on file    Non-medical: Not on file  Tobacco Use  . Smoking status: Former Smoker    Packs/day: 2.00    Years: 30.00    Pack years: 60.00    Types: Cigarettes    Last attempt to quit: 10/25/2006    Years since quitting: 11.1  . Smokeless tobacco: Never Used  Substance and Sexual Activity  . Alcohol use: Not Currently  . Drug use: Never  . Sexual activity: Not Currently  Lifestyle  . Physical activity:    Days per week: Not on file    Minutes per session: Not on file  . Stress: Not on file  Relationships  . Social connections:    Talks on phone: Not on file    Gets together: Not on file    Attends religious service: Not on file    Active member of club or organization: Not on file    Attends meetings of clubs or organizations: Not on file    Relationship status: Not on file  Other Topics Concern  . Not on file  Social History Narrative  . Not on file     Family History:  The patient's family history includes Alzheimer's disease in her mother; Heart disease in her brother.   ROS:   Please see the history of present illness.    Review of Systems  Constitution: Negative.    HENT: Negative.   Eyes: Negative.   Cardiovascular: Positive for dyspnea on exertion and leg swelling.  Respiratory: Negative.   Hematologic/Lymphatic: Negative.   Musculoskeletal: Negative.  Negative for joint pain.  Gastrointestinal: Negative.   Genitourinary: Negative.   Neurological: Negative.    All other systems reviewed and are negative.   PHYSICAL EXAM:   VS:  BP 98/70  Pulse (!) 59   Ht 5\' 4"  (1.626 m)   Wt 276 lb 6.4 oz (125.4 kg)   SpO2 98%   BMI 47.44 kg/m   Physical Exam  GEN: Obese, in no acute distress  Neck: no JVD, carotid bruits, or masses Cardiac:RRR; 2/6 to 3/6 systolic murmur at the left sternal border and apex Respiratory:  clear to auscultation bilaterally, normal work of breathing GI: soft, nontender, nondistended, + BS Ext: without cyanosis, clubbing, or edema, Good distal pulses bilaterally Neuro:  Alert and Oriented x 3 Psych: euthymic mood, full affect  Wt Readings from Last 3 Encounters:  01/02/18 276 lb 6.4 oz (125.4 kg)  12/14/17 290 lb 12.8 oz (131.9 kg)  11/08/17 287 lb (130.2 kg)      Studies/Labs Reviewed:   EKG:  EKG is not ordered today.   Recent Labs: 10/17/2017: B Natriuretic Peptide 606.4 10/18/2017: Hemoglobin 11.4; Platelets 239 11/03/2017: Magnesium 2.1 12/22/2017: ALT 32; BUN 16; Creatinine, Ser 1.22; Potassium 4.0; Sodium 142; TSH 3.250   Lipid Panel No results found for: CHOL, TRIG, HDL, CHOLHDL, VLDL, LDLCALC, LDLDIRECT  Additional studies/ records that were reviewed today include:  2Decho 12/26/17 Study Conclusions   - Left ventricle: The cavity size was mildly dilated. Wall   thickness was increased in a pattern of mild LVH. Systolic   function was normal. The estimated ejection fraction was in the   range of 55% to 60%. Wall motion was normal; there were no   regional wall motion abnormalities. Features are consistent with   a pseudonormal left ventricular filling pattern, with concomitant   abnormal relaxation  and increased filling pressure (grade 2   diastolic dysfunction). Doppler parameters are consistent with   high ventricular filling pressure. - Mitral valve: Calcified annulus. Mildly thickened leaflets .   There was moderate to severe regurgitation. - Left atrium: The atrium was moderately dilated. - Pulmonary arteries: Systolic pressure was mildly increased. PA   peak pressure: 42 mm Hg (S).   Impressions:   - Normal LV systolic function; mild LVH and LVE; moderate diastolic   dysfunction; moderate to severe MR; moderate LAE; trace TR with   mild pulmonary hypertension.     ASSESSMENT:    1. PAF (paroxysmal atrial fibrillation) (HCC)   2. DCM (dilated cardiomyopathy) (HCC)   3. Mitral valve insufficiency, unspecified etiology   4. Chronic systolic CHF (congestive heart failure) (HCC)   5. Essential hypertension      PLAN:  In order of problems listed above:  PAF on amiodarone, Eliquis and carvedilol and has maintained normal sinus rhythm  Dilated cardiomyopathy LV function is 55 to 60% with grade 2 DD most recent echo 12/26/2017-chronic leg edema but lungs clear.  Creatinine 1.22 on 12/22/2017.  Will not increase Demadex.  Moderate to severe MR on echo reviewed by structural heart team and she will see Dr. Excell Seltzerooper next week August 29 at 9 AM  Chronic combined systolic and diastolic CHF although systolic function has improved.  Continue Demadex.  Essential hypertension blood pressure on the low side.    Medication Adjustments/Labs and Tests Ordered: Current medicines are reviewed at length with the patient today.  Concerns regarding medicines are outlined above.  Medication changes, Labs and Tests ordered today are listed in the Patient Instructions below. Patient Instructions  Medication Instructions:  Your physician recommends that you continue on your current medications as directed. Please refer to the Current Medication list given to you today.   Labwork: None  ordered  Testing/Procedures: None ordered  Follow-Up: 1. KEEP APPOINTMENT WITH DR. Excell Seltzer AUGUST 29TH @ 9 AM  2. KEEP APPOINTMENT WITH DR. Mayford Knife ON October 30TH @ 11 AM.  Any Other Special Instructions Will Be Listed Below (If Applicable).     If you need a refill on your cardiac medications before your next appointment, please call your pharmacy.       Elson Clan, PA-C  01/02/2018 12:00 PM    [facility] [address] [place], [place], [place]  [address] Phone: [phone]; Fax: [phone]

## 2018-01-08 ENCOUNTER — Other Ambulatory Visit: Payer: Self-pay | Admitting: Cardiology

## 2018-01-08 DIAGNOSIS — I480 Paroxysmal atrial fibrillation: Secondary | ICD-10-CM

## 2018-01-09 ENCOUNTER — Encounter: Payer: Self-pay | Admitting: Physician Assistant

## 2018-01-09 DIAGNOSIS — N390 Urinary tract infection, site not specified: Secondary | ICD-10-CM | POA: Insufficient documentation

## 2018-01-09 DIAGNOSIS — M353 Polymyalgia rheumatica: Secondary | ICD-10-CM | POA: Insufficient documentation

## 2018-01-10 ENCOUNTER — Encounter: Payer: Self-pay | Admitting: Cardiovascular Disease

## 2018-01-11 ENCOUNTER — Ambulatory Visit: Payer: Medicare Other | Admitting: Cardiovascular Disease

## 2018-01-11 ENCOUNTER — Encounter: Payer: Self-pay | Admitting: Cardiovascular Disease

## 2018-01-11 VITALS — BP 122/68 | HR 63 | Ht 64.0 in | Wt 286.6 lb

## 2018-01-11 DIAGNOSIS — I340 Nonrheumatic mitral (valve) insufficiency: Secondary | ICD-10-CM

## 2018-01-11 NOTE — Patient Instructions (Signed)
Medication Instructions:  Your provider recommends that you continue on your current medications as directed. Please refer to the Current Medication list given to you today.    Labwork: None  Testing/Procedures: Your provider has requested that you have an echocardiogram in 6 months. Echocardiography is a painless test that uses sound waves to create images of your heart. It provides your doctor with information about the size and shape of your heart and how well your heart's chambers and valves are working. This procedure takes approximately one hour. There are no restrictions for this procedure.  Follow-Up: You will be called to arrange your echocardiogram and appointment with Dr. Mayford Knifeurner for late February of next year.  Any Other Special Instructions Will Be Listed Below (If Applicable).     If you need a refill on your cardiac medications before your next appointment, please call your pharmacy.

## 2018-01-11 NOTE — Progress Notes (Addendum)
Cardiology Office Note Date:  01/11/2018   ID:  [name] [name], DOB 1940-10-28, MRN [id number]  PCP:  Lelon Huh [name], MD  Cardiologist:  Armanda Magic, MD  Chief Complaint  Patient presents with  . Shortness of Breath     History of Present Illness: [name] [name] is a 77 y.o. female who presents for evaluation of severe mitral regurgitation.  The patient is referred by Dr. Mayford Knife.  The patient was hospitalized in May and June of this year with heart failure and atrial fibrillation with RVR.  Prior to that time, she had no history of valvular heart disease.  The patient was treated with a strategy of cardioversion initially.  She then required antiarrhythmic therapy after reverting back to atrial fibrillation with RVR.  She was hospitalized for a few weeks, requiring IV diuresis and management of her atrial fibrillation.  She has improved since that time and has been maintaining sinus rhythm on oral amiodarone.  During her evaluation, she was noted to have moderate to severe mitral regurgitation.  She presents today to review further treatment options for her mitral regurgitation.  The patient is here with her husband today. She's been on prednisone for 3-4 years and methotrexate for 2 years for traeatment of polymyalgia rheumatica. She has severe limitation from right knee pain and is here in a wheelchair today. She doesn't walk much at all, but uses a walker to get around in her house.  She remains short of breath with physical activity, but she really is most limited by morbid obesity and end-stage arthritis.  She denies chest pain or pressure.  She denies any recent problems with orthopnea or PND.  Her leg swelling has improved and she is compliant with diuretic therapy.  Past Medical History:  Diagnosis Date  . Arthritis    "inflammatory" (10/17/2017)  . Female bladder prolapse   . Hypertension   . Mitral regurgitation 12/14/2017   Moderate to severe by TEE/DCCV 09-2017  . Morbid  obesity (HCC)   . On home oxygen therapy    "2L; 24/7" (10/17/2017)  . PAF (paroxysmal atrial fibrillation) (HCC) paf  . Peripheral neuropathy    "BLE" (10/17/2017)  . Polymyalgia rheumatica (HCC)   . Recurrent UTI (urinary tract infection)     Past Surgical History:  Procedure Laterality Date  . ABDOMINAL HYSTERECTOMY    . CARDIOVERSION N/A 10/11/2017   Procedure: CARDIOVERSION;  Surgeon: Nahser, Deloris Ping, MD;  Location: [facility] ENDOSCOPY;  Service: Cardiovascular;  Laterality: N/A;  . CATARACT EXTRACTION W/ INTRAOCULAR LENS  IMPLANT, BILATERAL    . JOINT REPLACEMENT    . LAPAROSCOPIC CHOLECYSTECTOMY    . TEE WITHOUT CARDIOVERSION N/A 10/11/2017   Procedure: TRANSESOPHAGEAL ECHOCARDIOGRAM (TEE);  Surgeon: Elease Hashimoto Deloris Ping, MD;  Location: [facility] ENDOSCOPY;  Service: Cardiovascular;  Laterality: N/A;  . TOTAL KNEE ARTHROPLASTY Left 2008    Current Outpatient Medications  Medication Sig Dispense Refill  . alendronate (FOSAMAX) 70 MG tablet Take 70 mg by mouth once a week. Take with a full glass of water on an empty stomach.    Marland Kitchen amiodarone (PACERONE) 200 MG tablet Take 1 tablet (200 mg total) by mouth daily. 90 tablet 3  . apixaban (ELIQUIS) 5 MG TABS tablet Take 1 tablet (5 mg total) by mouth 2 (two) times daily. 60 tablet 10  . carvedilol (COREG) 25 MG tablet TAKE 1 TABLET(25 MG) BY MOUTH TWICE DAILY WITH A MEAL 60 tablet 11  . cholecalciferol (VITAMIN D) 1000 units tablet Take  2,000 Units by mouth daily.    . colestipol (COLESTID) 1 g tablet Take 1 g by mouth daily.   1  . ferrous sulfate 325 (65 FE) MG tablet Take 325 mg by mouth daily with breakfast.    . folic acid (FOLVITE) 1 MG tablet Take 1 mg by mouth daily.  0  . gabapentin (NEURONTIN) 300 MG capsule Take 300 mg by mouth at bedtime.  1  . HYDROcodone-acetaminophen (NORCO) 10-325 MG tablet Take 1 tablet by mouth every 6 (six) hours as needed for pain.  0  . losartan (COZAAR) 25 MG tablet TAKE 1 TABLET(25 MG) BY MOUTH DAILY 90 tablet 3  .  methotrexate (RHEUMATREX) 2.5 MG tablet Take 15 mg by mouth once a week. Take on wednesdays  0  . potassium chloride (K-DUR) 10 MEQ tablet Take 10 mEq by mouth 2 (two) times daily. Take (3) 10 mg tablets by mouth in the morning and 2 (10) mg tablets by mouth in the evening for a total of 50 mg per day.    . predniSONE (DELTASONE) 5 MG tablet Take 10 mg by mouth daily with breakfast.   0  . torsemide (DEMADEX) 20 MG tablet TAKE 3 TABS (60 MG) BY MOUTH EVERY AM, TAKE 2 TABS (40 MG) EVERY PM 450 tablet 3   No current facility-administered medications for this visit.     Allergies:   Fluoxetine; Adhesive [tape]; Demerol [meperidine hcl]; and Sulfa antibiotics   Social History:  The patient  reports that she quit smoking about 11 years ago. Her smoking use included cigarettes. She has a 60.00 pack-year smoking history. She has never used smokeless tobacco. She reports that she drank alcohol. She reports that she does not use drugs.   Family History:  The patient's family history includes Alzheimer's disease in her mother; Heart disease in her brother.    ROS:  Please see the history of present illness.  All other systems are reviewed and negative.    PHYSICAL EXAM: VS:  BP 122/68   Pulse 63   Ht 5\' 4"  (1.626 m)   Wt 286 lb 9.6 oz (130 kg)   SpO2 97%   BMI 49.19 kg/m  , BMI Body mass index is 49.19 kg/m. GEN: morbidly obese woman, in no acute distress  HEENT: normal  Neck: no JVD, no masses. No carotid bruits Cardiac: RRR with 2/6 holosystolic murmur at the left axilla, no diastolic murmur              Respiratory:  clear to auscultation bilaterally, normal work of breathing GI: soft, nontender, nondistended, + BS MS: no deformity or atrophy  Ext: 1+ bilateral pretibial edema, lower leg erythema bilaterally Skin: warm and dry, no rash Neuro:  Strength and sensation are intact Psych: euthymic mood, full affect  EKG:  EKG is not ordered today.  Recent Labs: 10/17/2017: B Natriuretic  Peptide 606.4 10/18/2017: Hemoglobin 11.4; Platelets 239 11/03/2017: Magnesium 2.1 12/22/2017: ALT 32; BUN 16; Creatinine, Ser 1.22; Potassium 4.0; Sodium 142; TSH 3.250   Lipid Panel  No results found for: CHOL, TRIG, HDL, CHOLHDL, VLDL, LDLCALC, LDLDIRECT    Wt Readings from Last 3 Encounters:  01/11/18 286 lb 9.6 oz (130 kg)  01/02/18 276 lb 6.4 oz (125.4 kg)  12/14/17 290 lb 12.8 oz (131.9 kg)     Cardiac Studies Reviewed: Echo: Left ventricle:  The cavity size was mildly dilated. Wall thickness was increased in a pattern of mild LVH. Systolic function was normal.  The estimated ejection fraction was in the range of 55% to 60%. Wall motion was normal; there were no regional wall motion abnormalities. Features are consistent with a pseudonormal left ventricular filling pattern, with concomitant abnormal relaxation and increased filling pressure (grade 2 diastolic dysfunction). Doppler parameters are consistent with high ventricular filling pressure.  ------------------------------------------------------------------- Aortic valve:   Trileaflet; normal thickness leaflets. Mobility was not restricted.  Doppler:  Transvalvular velocity was within the normal range. There was no stenosis. There was no regurgitation.   ------------------------------------------------------------------- Aorta:  Aortic root: The aortic root was normal in size.  ------------------------------------------------------------------- Mitral valve:   Calcified annulus. Mildly thickened leaflets . Mobility was not restricted.  Doppler:  Transvalvular velocity was within the normal range. There was no evidence for stenosis. There was moderate to severe regurgitation.    Peak gradient (D): 9 mm Hg.  ------------------------------------------------------------------- Left atrium:  The atrium was moderately dilated.  ------------------------------------------------------------------- Right ventricle:  The  cavity size was normal. Systolic function was normal.  ------------------------------------------------------------------- Pulmonic valve:    Doppler:  Transvalvular velocity was within the normal range. There was no evidence for stenosis.  ------------------------------------------------------------------- Tricuspid valve:   Structurally normal valve.    Doppler: Transvalvular velocity was within the normal range. There was trivial regurgitation.  ------------------------------------------------------------------- Pulmonary artery:   Systolic pressure was mildly increased.  ------------------------------------------------------------------- Right atrium:  The atrium was normal in size.  ------------------------------------------------------------------- Pericardium:  There was no pericardial effusion.  TEE: Left ventricle:   No evidence of thrombus.  ------------------------------------------------------------------- Aortic valve:   The valve appears to be grossly normal.    Doppler:   There was no stenosis.  ------------------------------------------------------------------- Aorta:  The aorta was normal.  ------------------------------------------------------------------- Mitral valve:   The valve appears to be grossly normal.    Doppler:  There was moderate to severe regurgitation.  ------------------------------------------------------------------- Left atrium:   No evidence of thrombus in the atrial cavity or appendage.  ------------------------------------------------------------------- Tricuspid valve:   The valve appears to be grossly normal. Doppler:  There was mild-moderate regurgitation.   ------------------------------------------------------------------- Post procedure conclusions Ascending Aorta:  - The aorta was normal.  STS Risk Calculator: Procedure: Isolated MVR   Risk of Mortality: 9.472%  Renal Failure: 7.910%  Permanent Stroke:  0.813%  Prolonged Ventilation: 30.441%  DSW Infection: 0.460%  Reoperation: 4.177%  Morbidity or Mortality: 30.893%  Short Length of Stay: 6.303%  Long Length of Stay: 22.276%   Procedure: MV Repair   Risk of Mortality: 4.895%  Renal Failure: 5.426%  Permanent Stroke: 0.706%  Prolonged Ventilation: 22.100%  DSW Infection: 0.240%  Reoperation: 2.368%  Morbidity or Mortality: 22.523%  Short Length of Stay: 8.847%  Long Length of Stay: 16.473%   ASSESSMENT AND PLAN: This is a 77 year old woman with persistent atrial fibrillation, maintaining sinus rhythm on amiodarone, who has moderate to severe mitral regurgitation.  I have personally reviewed her surface echo and TEE images which demonstrate mild LV dysfunction without any significant LV dilatation.  There is a 3+ mitral regurgitation with an EROA of 0.2 and no evidence of pulmonary vein flow reversal.  There is mild thickening of the mitral valve leaflets and probably a component of MR related to left atrial dilatation.  I discussed potential treatment options with the patient, including continued medical therapy, surgical mitral valve repair/replacement with concomitant maze, and percutaneous edge to edge mitral valve repair with MitraClip.  The patient understands there is a relationship between her mitral regurgitation and atrial fibrillation.  We discussed evaluation would need to  include right and left heart catheterization as well as formal cardiac surgical consultation.  We discussed treatment options in the context of this patient's specific comorbid medical conditions.   The patient is counseled specifically about the risks, indications, and alternatives to percutaneous mitral valve repair with MitraClip. Specific risks include vascular injury, bleeding, infection, arrhythmia, myocardial infarction, stroke, cardiac perforation, cardiac tamponade, device embolization, single leaflet detachment, endocarditis, mitral valve injury,  emergency surgery, and death. They understand the risk of serious complication occurs at a rate of approximately 2%.   At this point the patient prefers to continue with medical therapy.  I think this is a reasonable approach considering her morbid obesity and severely impaired functional capacity.  She clearly would not be a candidate for cardiac surgery on chronic immunosuppression with her other comorbid conditions.  MitraClip would certainly be reasonable if her symptoms progress, but even minimally invasive procedures would have higher than normal risk with her body habitus.  I recommended that she have a repeat echocardiogram in 6 months and continue to follow with Dr. Mayford Knifeurner.  I would be happy to see her back in the future if her mitral regurgitation worsens or if she develops progressive symptoms of heart failure  Current medicines are reviewed with the patient today.  The patient does not have concerns regarding medicines.  Labs/ tests ordered today include:   Orders Placed This Encounter  Procedures  . ECHOCARDIOGRAM COMPLETE    Disposition:   FU prn  Signed, Tonny BollmanMichael Lochlyn Zullo, MD  01/11/2018 12:31 PM    [facility] [address], [place], [place]  [address] Phone: [phone]; Fax: [phone]

## 2018-01-13 ENCOUNTER — Encounter (HOSPITAL_BASED_OUTPATIENT_CLINIC_OR_DEPARTMENT_OTHER): Payer: Medicare Other

## 2018-01-24 ENCOUNTER — Other Ambulatory Visit: Payer: Self-pay | Admitting: Cardiology

## 2018-03-14 ENCOUNTER — Ambulatory Visit: Payer: Medicare Other | Admitting: Cardiology

## 2018-03-19 NOTE — Progress Notes (Signed)
Cardiology Office Note:    Date:  03/20/2018   ID:  [name] [name], DOB 1941-01-15, MRN [id number]  PCP:  Lelon Huh [name], MD  Cardiologist:  Armanda Magic, MD    Referring MD: Dellinger, Romelle Starcher*   Chief Complaint  Patient presents with  . Atrial Fibrillation  . Hypertension  . Congestive Heart Failure    History of Present Illness:    [name] [name] is a 77 y.o. female with a hx of  paroxysmal atrial fibrillation on chronic anti-coagulation with Eliquis, hypertension, chronic systolic CHF, polymyalgia rheumatica on methotrexate and prednisone. She was hospitalized on 4 June with worsening dyspnea on exertion and palpitations. She had been in the hospital 1 week prior with new onset atrial fibrillation with RVR and underwent TEE /cardioversion to normal sinus rhythm. She was found to be back in atrial fibrillation with RVR in the ER with heart rates of 110 to 140 bpm. BNP was elevated at 606 and chest x-ray was consistent with CHF. She was started on IV Cardizem drip and then started on amiodarone. She converted spontaneously on amio to sinus rhythm and she was switched to oral amiodarone. She was discharged home on Lasix 80 mg in the morning 40 mg in the evening. 2D echocardiogram 10/08/2017 showed mildly reduced LV function with EF 45 to 50% with diffuse hypokinesis and moderate mitral regurgitation.  TEE on 10/11/2017 prior to cardioversion showed moderate to severe MR.  She was referred to Dr. Calton Dach for evaluation possible candidacy for mitral clip.  She does not walk much and uses a walker to get around the house because of severe arthritis and obesity.  And remains short of breath with any physical activity.  Dr. Excell Seltzer discussed percutaneous mitral valve repair with MitraClip with her.  At this point she wanted to continue medical therapy which she felt was reasonable given her morbid obesity and severely impaired functional capacity.  MitraClip would be reasonable  if symptoms progressed but still higher than her normal risk patient given her morbid obesity.  Plan was to repeat 2D echocardiogram in 6 months and if symptoms worsen refer back to Dr. Excell Seltzer.  She is here today for followup and is actually doing quite well.  She denies any chest pain or pressure, SOB, DOE, PND, orthopnea, dizziness, palpitations or syncope.  Her main complaint is her chronic arthritic pain in her knees as well as chronic lower extremity edema.  She says the torsemide helps some but she still has problems with swelling in her feet mainly.  She is compliant with her meds and is tolerating meds with no SE.     Past Medical History:  Diagnosis Date  . Arthritis    "inflammatory" (10/17/2017)  . Female bladder prolapse   . Hypertension   . Mitral regurgitation 12/14/2017   Moderate to severe by TEE/DCCV 09-2017  . Morbid obesity (HCC)   . On home oxygen therapy    "2L; 24/7" (10/17/2017)  . PAF (paroxysmal atrial fibrillation) (HCC) paf  . Peripheral neuropathy    "BLE" (10/17/2017)  . Polymyalgia rheumatica (HCC)   . Recurrent UTI (urinary tract infection)     Past Surgical History:  Procedure Laterality Date  . ABDOMINAL HYSTERECTOMY    . CARDIOVERSION N/A 10/11/2017   Procedure: CARDIOVERSION;  Surgeon: Nahser, Deloris Ping, MD;  Location: [facility] ENDOSCOPY;  Service: Cardiovascular;  Laterality: N/A;  . CATARACT EXTRACTION W/ INTRAOCULAR LENS  IMPLANT, BILATERAL    . JOINT REPLACEMENT    .  LAPAROSCOPIC CHOLECYSTECTOMY    . TEE WITHOUT CARDIOVERSION N/A 10/11/2017   Procedure: TRANSESOPHAGEAL ECHOCARDIOGRAM (TEE);  Surgeon: [name], MD;  Location[facility] ENDOSCOPY;  Service: Cardiovascular;  Laterality: N/A;  . TOTAL KNEE ARTHROPLASTY Left 2008    Current Medications: Current Meds  Medication Sig  . alendronate (FOSAMAX) 70 MG tablet Take 70 mg by mouth once a week. Take with a full glass of water on an empty stomach.  Marland Kitchen amiodarone (PACERONE) 200 MG tablet Take 1 tablet  (200 mg total) by mouth daily.  Marland Kitchen apixaban (ELIQUIS) 5 MG TABS tablet Take 1 tablet (5 mg total) by mouth 2 (two) times daily.  . carvedilol (COREG) 25 MG tablet TAKE 1 TABLET(25 MG) BY MOUTH TWICE DAILY WITH A MEAL  . cholecalciferol (VITAMIN D) 1000 units tablet Take 2,000 Units by mouth daily.  . colestipol (COLESTID) 1 g tablet Take 1 g by mouth daily.   . ferrous sulfate 325 (65 FE) MG tablet Take 325 mg by mouth daily with breakfast.  . folic acid (FOLVITE) 1 MG tablet Take 1 mg by mouth daily.  Marland Kitchen gabapentin (NEURONTIN) 300 MG capsule Take 300 mg by mouth at bedtime.  Marland Kitchen HYDROcodone-acetaminophen (NORCO) 10-325 MG tablet Take 1 tablet by mouth every 6 (six) hours as needed for pain.  Marland Kitchen losartan (COZAAR) 25 MG tablet TAKE 1 TABLET(25 MG) BY MOUTH DAILY  . methotrexate (RHEUMATREX) 2.5 MG tablet Take 15 mg by mouth once a week. Take on wednesdays  . potassium chloride (K-DUR) 10 MEQ tablet Take 10 mEq by mouth 2 (two) times daily. Take (3) 10 mg tablets by mouth in the morning and 2 (10) mg tablets by mouth in the evening for a total of 50 mg per day.  . predniSONE (DELTASONE) 5 MG tablet Take 10 mg by mouth daily with breakfast.   . torsemide (DEMADEX) 20 MG tablet TAKE 3 TABS (60 MG) BY MOUTH EVERY AM, TAKE 2 TABS (40 MG) EVERY PM     Allergies:   Fluoxetine; Adhesive [tape]; Demerol [meperidine hcl]; and Sulfa antibiotics   Social History   Socioeconomic History  . Marital status: Married    Spouse name: Not on file  . Number of children: Not on file  . Years of education: Not on file  . Highest education level: Not on file  Occupational History  . Not on file  Social Needs  . Financial resource strain: Not on file  . Food insecurity:    Worry: Not on file    Inability: Not on file  . Transportation needs:    Medical: Not on file    Non-medical: Not on file  Tobacco Use  . Smoking status: Former Smoker    Packs/day: 2.00    Years: 30.00    Pack years: 60.00    Types:  Cigarettes    Last attempt to quit: 10/25/2006    Years since quitting: 11.4  . Smokeless tobacco: Never Used  Substance and Sexual Activity  . Alcohol use: Not Currently  . Drug use: Never  . Sexual activity: Not Currently  Lifestyle  . Physical activity:    Days per week: Not on file    Minutes per session: Not on file  . Stress: Not on file  Relationships  . Social connections:    Talks on phone: Not on file    Gets together: Not on file    Attends religious service: Not on file    Active member of club or  organization: Not on file    Attends meetings of clubs or organizations: Not on file    Relationship status: Not on file  Other Topics Concern  . Not on file  Social History Narrative  . Not on file     Family History: The patient's family history includes Alzheimer's disease in her mother; Heart disease in her brother.  ROS:   Please see the history of present illness.    ROS  All other systems reviewed and negative.   EKGs/Labs/Other Studies Reviewed:    The following studies were reviewed today: none  EKG:  EKG is not ordered today.   Recent Labs: 10/17/2017: B Natriuretic Peptide 606.4 10/18/2017: Hemoglobin 11.4; Platelets 239 11/03/2017: Magnesium 2.1 12/22/2017: ALT 32; BUN 16; Creatinine, Ser 1.22; Potassium 4.0; Sodium 142; TSH 3.250   Recent Lipid Panel No results found for: CHOL, TRIG, HDL, CHOLHDL, VLDL, LDLCALC, LDLDIRECT  Physical Exam:    VS:  BP 128/72   Pulse 65   Ht 5\' 4"  (1.626 m)   Wt 284 lb 12.8 oz (129.2 kg)   SpO2 95%   BMI 48.89 kg/m     Wt Readings from Last 3 Encounters:  03/20/18 284 lb 12.8 oz (129.2 kg)  01/11/18 286 lb 9.6 oz (130 kg)  01/02/18 276 lb 6.4 oz (125.4 kg)     GEN:  Well nourished, well developed in no acute distress HEENT: Normal NECK: No JVD; No carotid bruits LYMPHATICS: No lymphadenopathy CARDIAC: RRR, no murmurs, rubs, gallops RESPIRATORY:  Clear to auscultation without rales, wheezing or rhonchi    ABDOMEN: Soft, non-tender, non-distended MUSCULOSKELETAL: 2-3+ pedal edema; No deformity  SKIN: Warm and dry NEUROLOGIC:  Alert and oriented x 3 PSYCHIATRIC:  Normal affect   ASSESSMENT:    1. PAF (paroxysmal atrial fibrillation) (HCC)   2. Essential hypertension   3. DCM (dilated cardiomyopathy) (HCC)   4. Chronic combined systolic and diastolic heart failure (HCC)   5. Nonrheumatic mitral valve regurgitation   6. Morbid obesity (HCC)    PLAN:    In order of problems listed above:  1.  Paroxysmal atrial fibrillation - she is maintaining normal sinus rhythm on exam.  She will continue on amiodarone 200 mg daily, carvedilol 25 mg twice daily and apixaban 5 mg twice daily.  Creatinine was stable at 1.22 and potassium 4 on 12/22/2017.  TSH was 3.25 on 12/22/2017 and ALT was normal at 32.  Given her morbid obesity and body habitus I do not think she will be able to perform PFTs.  Chest x-ray in June 2019 showed minimal bibasilar atelectasis and mild vascular congestion.  Her lungs are clear on exam today.  2.  Hypertension -BP is well controlled on exam today.  She will continue on carvedilol 25 mg twice daily and losartan 25 mg daily.  Her creatinine was stable at 1.22 and potassium 4 on 12/22/2017.  3.  DCM -2D echo 12/26/2017 showed mildly dilated LV with normal LV systolic function EF 55 to 60% with grade 2 diastolic dysfunction.  4.  Chronic diastolic CHF -fluid status difficult to assess in this morbidly obese patient and BNP would likely not be representative given her morbid obesity.  She is fairly asymptomatic at this time.  Her weight has not really changed and is only down 2 pounds from her weight in August.  She has chronic lower extremity edema but I think this is more related to her obesity and sedentary state.  She sits around a lot  with her legs hanging down.  Her edema is mainly pedal edema.  And I really think this is mostly related to morbid obesity around her abdomen.  I am leery  about increasing her diuretic any further which could lead to worsening renal function.  She will continue on torsemide 60 mg every morning and 40 mg every afternoon.  Creatinine and potassium have been stable.  I instructed her to let me know if she develops any shortness of breath or worsening of her lower extremity edema.  5.  Moderate to severe mitral regurgitation -she was evaluated earlier in the year by Dr. Excell Seltzer for possible candidacy for MitraClip.  He felt she would be a candidate although higher higher risk than a normal patient given her morbid obesity, chronic anticoagulation with immunocompromise state and sedentary state.  At this time patient did not want to pursue further evaluation for MitraClip in 1 to pursue continued medical therapy with diuretics.  We will repeat an echo in February.  6.  Morbid obesity - she is essentially wheelchair-bound and cannot exercise due to severe arthritis.    Medication Adjustments/Labs and Tests Ordered: Current medicines are reviewed at length with the patient today.  Concerns regarding medicines are outlined above.  No orders of the defined types were placed in this encounter.  No orders of the defined types were placed in this encounter.   Signed, Armanda Magic, MD  03/20/2018 2:31 PM    [facility]

## 2018-03-20 ENCOUNTER — Encounter: Payer: Self-pay | Admitting: Cardiology

## 2018-03-20 ENCOUNTER — Ambulatory Visit (INDEPENDENT_AMBULATORY_CARE_PROVIDER_SITE_OTHER): Payer: Medicare Other | Admitting: Cardiology

## 2018-03-20 VITALS — BP 128/72 | HR 65 | Ht 64.0 in | Wt 284.8 lb

## 2018-03-20 DIAGNOSIS — I340 Nonrheumatic mitral (valve) insufficiency: Secondary | ICD-10-CM

## 2018-03-20 DIAGNOSIS — I480 Paroxysmal atrial fibrillation: Secondary | ICD-10-CM | POA: Diagnosis not present

## 2018-03-20 DIAGNOSIS — I420 Dilated cardiomyopathy: Secondary | ICD-10-CM

## 2018-03-20 DIAGNOSIS — I10 Essential (primary) hypertension: Secondary | ICD-10-CM | POA: Diagnosis not present

## 2018-03-20 DIAGNOSIS — I5042 Chronic combined systolic (congestive) and diastolic (congestive) heart failure: Secondary | ICD-10-CM | POA: Diagnosis not present

## 2018-03-20 NOTE — Addendum Note (Signed)
Addended by: Dustin Flock on: 03/20/2018 02:49 PM   Modules accepted: Orders

## 2018-03-20 NOTE — Patient Instructions (Signed)
Medication Instructions:  Your physician recommends that you continue on your current medications as directed. Please refer to the Current Medication list given to you today.  If you need a refill on your cardiac medications before your next appointment, please call your pharmacy.   Lab work:  If you have labs (blood work) drawn today and your tests are completely normal, you will receive your results only by: Marland Kitchen MyChart Message (if you have MyChart) OR . A paper copy in the mail If you have any lab test that is abnormal or we need to change your treatment, we will call you to review the results.  Testing/Procedures: Your physician has requested that you have an echocardiogram. Echocardiography is a painless test that uses sound waves to create images of your heart. It provides your doctor with information about the size and shape of your heart and how well your heart's chambers and valves are working. This procedure takes approximately one hour. There are no restrictions for this procedure.  Follow-Up: At [facility], you and your health needs are our priority.  As part of our continuing mission to provide you with exceptional heart care, we have created designated Provider Care Teams.  These Care Teams include your primary Cardiologist (physician) and Advanced Practice Providers (APPs -  Physician Assistants and Nurse Practitioners) who all work together to provide you with the care you need, when you need it. You will need a follow up appointment in 6 months.  Please call our office 2 months in advance to schedule this appointment.  You may see Armanda Magic, MD or one of the following Advanced Practice Providers on your designated Care Team:   [place], PA-C Ronie Spies, PA-C . Jacolyn Reedy, PA-C

## 2018-05-22 DIAGNOSIS — M353 Polymyalgia rheumatica: Secondary | ICD-10-CM | POA: Diagnosis not present

## 2018-05-22 DIAGNOSIS — R7309 Other abnormal glucose: Secondary | ICD-10-CM | POA: Diagnosis not present

## 2018-05-22 DIAGNOSIS — Z79899 Other long term (current) drug therapy: Secondary | ICD-10-CM | POA: Diagnosis not present

## 2018-05-22 DIAGNOSIS — M0609 Rheumatoid arthritis without rheumatoid factor, multiple sites: Secondary | ICD-10-CM | POA: Diagnosis not present

## 2018-06-06 ENCOUNTER — Other Ambulatory Visit: Payer: Self-pay | Admitting: Cardiology

## 2018-06-06 MED ORDER — POTASSIUM CHLORIDE ER 10 MEQ PO TBCR: EXTENDED_RELEASE_TABLET | ORAL | 3 refills | Status: AC

## 2018-06-06 MED ORDER — AMIODARONE HCL 200 MG PO TABS: 200.0000 mg | ORAL_TABLET | Freq: Every day | ORAL | 3 refills | Status: AC

## 2018-06-06 NOTE — Telephone Encounter (Signed)
Pt's medications were sent to pt's pharmacy as requested. Confirmation received.  

## 2018-06-06 NOTE — Telephone Encounter (Signed)
°*  STAT* If patient is at the pharmacy, call can be transferred to refill team.   1. Which medications need to be refilled? (please list name of each medication and dose if known) Amiodarone and Potassium  2. Which pharmacy/location (including street and city if local pharmacy) is medication to be sent to? [facility] [address]  3. Do they need a 30 day or 90 day supply? 90 days supply and refills

## 2018-06-26 ENCOUNTER — Other Ambulatory Visit (HOSPITAL_COMMUNITY): Payer: Medicare Other

## 2018-06-26 DIAGNOSIS — R269 Unspecified abnormalities of gait and mobility: Secondary | ICD-10-CM | POA: Diagnosis not present

## 2018-06-26 DIAGNOSIS — G894 Chronic pain syndrome: Secondary | ICD-10-CM | POA: Diagnosis not present

## 2018-06-26 DIAGNOSIS — M170 Bilateral primary osteoarthritis of knee: Secondary | ICD-10-CM | POA: Diagnosis not present

## 2018-06-26 DIAGNOSIS — M160 Bilateral primary osteoarthritis of hip: Secondary | ICD-10-CM | POA: Diagnosis not present

## 2018-06-28 ENCOUNTER — Encounter: Payer: Self-pay | Admitting: Cardiology

## 2018-06-28 ENCOUNTER — Ambulatory Visit (HOSPITAL_COMMUNITY): Payer: HMO | Attending: Cardiology

## 2018-06-28 DIAGNOSIS — I340 Nonrheumatic mitral (valve) insufficiency: Secondary | ICD-10-CM | POA: Insufficient documentation

## 2018-06-28 DIAGNOSIS — I77810 Thoracic aortic ectasia: Secondary | ICD-10-CM | POA: Insufficient documentation

## 2018-06-29 ENCOUNTER — Telehealth: Payer: Self-pay

## 2018-06-29 DIAGNOSIS — I77810 Thoracic aortic ectasia: Secondary | ICD-10-CM

## 2018-06-29 NOTE — Telephone Encounter (Signed)
Spoke with the patient, she expressed understanding about her results. She had no further questions. Repeat echo ordered for 1 year, 06/30/19.

## 2018-06-29 NOTE — Telephone Encounter (Signed)
-----   Message from Quintella Reichert, MD sent at 06/28/2018 11:01 PM EST ----- Echo showed normal LVF with increased stiffness of heart muscle moderately enlarged LA, moderate leakiness of MV, mild to moderately dilated ascending aorta at 17mm - repeat echo in 1 year

## 2018-07-23 DIAGNOSIS — L03116 Cellulitis of left lower limb: Secondary | ICD-10-CM | POA: Diagnosis not present

## 2018-07-23 DIAGNOSIS — L03115 Cellulitis of right lower limb: Secondary | ICD-10-CM | POA: Diagnosis not present

## 2018-07-23 DIAGNOSIS — M7989 Other specified soft tissue disorders: Secondary | ICD-10-CM | POA: Diagnosis not present

## 2018-08-08 ENCOUNTER — Other Ambulatory Visit: Payer: Self-pay

## 2018-08-08 NOTE — Patient Outreach (Signed)
  [facility]) Care Management Chronic Special Needs Program  08/08/2018  Name: [name] [name] DOB: 07-05-1940  MRN: [id number]  Ms. Ece Hardwicke is enrolled in a Chronic Special Needs Plan. RNCM called to review Health Risk assessment and complete individualized care plan. No answer. HIPPA compliant message left.    Plan: Chronic care management coordinator will attempt outreach in 1-2 business days.  Kathyrn Sheriff, RN, MSN, [facility] Chronic Care Management Coordinator [facility] [phone]

## 2018-08-09 ENCOUNTER — Other Ambulatory Visit: Payer: Self-pay

## 2018-08-09 NOTE — Patient Outreach (Signed)
  [facility]) Care Management Chronic Special Needs Program  08/09/2018  Name: [name] [name] DOB: Oct 21, 1940  MRN: [id number]  [name] [name] is enrolled in a chronic special needs plan for Heart Failure. Chronic Care Management Coordinator telephoned client to review health risk assessment and to develop individualized care plan.  Introduced the chronic care management program, importance of client participation, and taking their care plan to all provider appointments and inpatient facilities.  Reviewed the transition of care process and possible referral to community care management.  Subjective: client reports a history of heart failure, hypertension, Heart disease and atrial fibrillation. She states she does not weigh self daily. She states she used to, but does not currently. Client is on greater than 8 medications and is receptive to [facility] pharmacy referral for medication review.  Goals Addressed            This Visit's Progress   . Client understands the importance of follow-up with providers by attending scheduled visits      . Client verbalize knowledge of Heart Failure diease self management skills within the next 6 months.      . Client will report no worsening of symptoms of Atrial Fibrillation within the next 6 months      . Client will report no worsening of symptoms related to heart disease within the next 6 months       . Client will report weighing and recording weights daily within the next 6  months.      . Client will verbalize knowledge of self management of Hypertension as evidences by BP reading of 140/90 or less; or as defined by provider      . Maintain timely refills of Heart Failure medication as prescribed within the year       . Obtain annual  Lipid Profile, LDL-C      . Visit Primary Care Provider or Cardiologist at least 2 times per year         Plan:  Send successful outreach letter with a copy of their individualized care plan, Send  individual care plan to provider and Send educational material Chronic care management coordination will outreach in: 6 months. Will refer client to:  Pharmacy   Kathyrn Sheriff, RN, MSN, [facility] Chronic Care Management Coordinator [facility] [phone]

## 2018-08-14 ENCOUNTER — Other Ambulatory Visit: Payer: Self-pay | Admitting: Pharmacist

## 2018-08-14 NOTE — Patient Outreach (Signed)
[facility]) Care Management  [facility]   08/14/2018  Jannene Zelazny 11/19/1940 [id number]  Reason for referral: Medication Assistance, Medication Review  Referral source: [facility] Care Management RN with [employer] Current insurance: [employer]  PMHx includes but not limited to:  HTN, systolic CHF (EF 51-76% 8/'19), atrial fibrillation on chronic anticoagulation, polymyalgia rheumatica, inflammatory arthritis, osteoporosis, peripheral neuropathy, morbid obesity, chronic pain syndrome  Outreach:  Successful telephone call with Ms. Ebrahimi.  HIPAA identifiers verified.   Subjective:  Patient agreeable to review medications.  She denies having any specific questions or concerns with her medications.    Objective: Lab Results  Component Value Date   CREATININE 1.22 (H) 12/22/2017   CREATININE 1.15 (H) 11/08/2017   CREATININE 1.17 (H) 11/03/2017    No results found for: HGBA1C  Lipid Panel  No results found for: CHOL, TRIG, HDL, CHOLHDL, VLDL, LDLCALC, LDLDIRECT  BP Readings from Last 3 Encounters:  03/20/18 128/72  01/11/18 122/68  01/02/18 98/70    Allergies  Allergen Reactions  . Fluoxetine Other (See Comments)    Altered mental status  . Adhesive [Tape] Rash    Cannot tolerate any tape or bandaids more than 24 hrs.  . Demerol [Meperidine Hcl] Rash  . Sulfa Antibiotics Rash    Medications Reviewed Today    Reviewed by Colletta Maryland, RN (Registered Nurse) on 08/09/18 at 1006  Med List Status: <None>  Medication Order Taking? Sig Documenting Provider Last Dose Status Informant  alendronate (FOSAMAX) 70 MG tablet [id number] No Take 70 mg by mouth once a week. Take with a full glass of water on an empty stomach. [provider] Taking Active Self           Med Note Larina Bras Jan 11, 2018  8:49 AM)    amiodarone (PACERONE) 200 MG tablet [id number]  Take 1 tablet (200 mg total) by mouth  daily. Quintella Reichert, MD  Active   apixaban (ELIQUIS) 5 MG TABS tablet [id number] No Take 1 tablet (5 mg total) by mouth 2 (two) times daily. Quintella Reichert, MD Taking Active   carvedilol (COREG) 25 MG tablet [id number] No TAKE 1 TABLET(25 MG) BY MOUTH TWICE DAILY WITH A MEAL Turner, Traci R, MD Taking Active   cholecalciferol (VITAMIN D) 1000 units tablet [id number] No Take 2,000 Units by mouth daily. [provider] Taking Active Self  colestipol (COLESTID) 1 g tablet [id number] No Take 1 g by mouth daily.  [provider] Taking Active Self  ferrous sulfate 325 (65 FE) MG tablet [id number] No Take 325 mg by mouth daily with breakfast. [provider] Taking Active Self  folic acid (FOLVITE) 1 MG tablet [id number] No Take 1 mg by mouth daily. [provider] Taking Active Self  gabapentin (NEURONTIN) 300 MG capsule [id number] No Take 300 mg by mouth at bedtime. [provider] Taking Active Self  HYDROcodone-acetaminophen (NORCO) 10-325 MG tablet [id number] No Take 1 tablet by mouth every 6 (six) hours as needed for pain. [provider] Taking Active Self  losartan (COZAAR) 25 MG tablet [id number] No TAKE 1 TABLET(25 MG) BY MOUTH DAILY Turner, Cornelious Bryant, MD Taking Active   methotrexate (RHEUMATREX) 2.5 MG tablet [id number] No Take 15 mg by mouth once a week. Take on wednesdays [provider] Taking Active Self           Med Note Larina Bras Jan 11, 2018  8:50 AM)    potassium chloride (K-DUR) 10 MEQ tablet [id number]  Take (3) 10 mg tablets by mouth in the morning and 2 (10) mg tablets by mouth in the evening for a total of 50 mg per day. Quintella Reichert, MD  Active   predniSONE (DELTASONE) 5 MG tablet [id number] No Take 10 mg by mouth daily with breakfast.  [provider] Taking Active Self           Med Note Sherald Hess, Clide Dales   Thu Jan 11, 2018  8:50 AM)    torsemide (DEMADEX) 20 MG tablet [id number] No TAKE 3 TABS  (60 MG) BY MOUTH EVERY AM, TAKE 2 TABS (40 MG) EVERY PM Turner, Cornelious Bryant, MD Taking Active           Assessment:  Drugs sorted by system:  Neurologic/Psychologic: gabapentin  Cardiovascular: amiodarone, carvedilol, colestipol, losartan, torsemide, potassium  Hematalogic: apixaban, ferrous sulfate, folic acid  Endocrine:prednisone  Pain: hydrocodone-acetaminophen  Oncology: methotrexate (for RA)  Vitamins/Minerals/Supplements: cholecalciferol,   Medication Review Findings:  . Medications dose adjusted for renal function . Patient alternates 40-60mg  toresmide in the morning depending on her swelling.  Reviewed prescribed instructions per cardiology of 60mg  each morning and importance of checking daiy weights.  Patient voiced understanding.   . No reported signs / symptoms of bleeding with apixaban  Medication Assistance Findings:  No medication assistance needs identified  Plan: . Will close [facility] pharmacy case as no further medication needs identified at this time.  Am happy to assist in the future as needed.     Haynes Hoehn, PharmD, [facility] Clinical Pharmacist [facility] [phone]

## 2018-08-20 DIAGNOSIS — L03115 Cellulitis of right lower limb: Secondary | ICD-10-CM | POA: Diagnosis not present

## 2018-08-27 DIAGNOSIS — T368X5A Adverse effect of other systemic antibiotics, initial encounter: Secondary | ICD-10-CM | POA: Diagnosis not present

## 2018-08-27 DIAGNOSIS — M0609 Rheumatoid arthritis without rheumatoid factor, multiple sites: Secondary | ICD-10-CM | POA: Diagnosis not present

## 2018-08-27 DIAGNOSIS — M170 Bilateral primary osteoarthritis of knee: Secondary | ICD-10-CM | POA: Diagnosis not present

## 2018-08-27 DIAGNOSIS — R070 Pain in throat: Secondary | ICD-10-CM | POA: Diagnosis not present

## 2018-08-27 DIAGNOSIS — L03115 Cellulitis of right lower limb: Secondary | ICD-10-CM | POA: Diagnosis not present

## 2018-08-27 DIAGNOSIS — Z79899 Other long term (current) drug therapy: Secondary | ICD-10-CM | POA: Diagnosis not present

## 2018-08-27 DIAGNOSIS — M353 Polymyalgia rheumatica: Secondary | ICD-10-CM | POA: Diagnosis not present

## 2018-10-02 DIAGNOSIS — I5042 Chronic combined systolic (congestive) and diastolic (congestive) heart failure: Secondary | ICD-10-CM | POA: Diagnosis not present

## 2018-10-02 DIAGNOSIS — I130 Hypertensive heart and chronic kidney disease with heart failure and stage 1 through stage 4 chronic kidney disease, or unspecified chronic kidney disease: Secondary | ICD-10-CM | POA: Diagnosis not present

## 2018-10-02 DIAGNOSIS — N183 Chronic kidney disease, stage 3 (moderate): Secondary | ICD-10-CM | POA: Diagnosis not present

## 2018-10-02 DIAGNOSIS — G894 Chronic pain syndrome: Secondary | ICD-10-CM | POA: Diagnosis not present

## 2018-10-03 ENCOUNTER — Telehealth: Payer: Self-pay | Admitting: Cardiology

## 2018-10-03 NOTE — Telephone Encounter (Signed)
Virtual Visit Pre-Appointment Phone Call  "(Name), I am calling you today to discuss your upcoming appointment. We are currently trying to limit exposure to the virus that causes COVID-19 by seeing patients at home rather than in the office."  1. "What is the BEST phone number to call the day of the visit?" - include this in appointment notes  2. Do you have or have access to (through a family member/friend) a smartphone with video capability that we can use for your visit?" a. If yes - list this number in appt notes as cell (if different from BEST phone #) and list the appointment type as a VIDEO visit in appointment notes b. If no - list the appointment type as a PHONE visit in appointment notes  3. Confirm consent - "In the setting of the current Covid19 crisis, you are scheduled for a (phone or video) visit with your provider on (date) at (time).  Just as we do with many in-office visits, in order for you to participate in this visit, we must obtain consent.  If you'd like, I can send this to your mychart (if signed up) or email for you to review.  Otherwise, I can obtain your verbal consent now.  All virtual visits are billed to your insurance company just like a normal visit would be.  By agreeing to a virtual visit, we'd like you to understand that the technology does not allow for your provider to perform an examination, and thus may limit your provider's ability to fully assess your condition. If your provider identifies any concerns that need to be evaluated in person, we will make arrangements to do so.  Finally, though the technology is pretty good, we cannot assure that it will always work on either your or our end, and in the setting of a video visit, we may have to convert it to a phone-only visit.  In either situation, we cannot ensure that we have a secure connection.  Are you willing to proceed?" STAFF: Did the patient verbally acknowledge consent to telehealth visit? Document  YES/NO here: yes/ Video/doxy.me/smartphone 33 [phone]/verbal consent 10/03/18/vitals/ 4. Advise patient to be prepared - "Two hours prior to your appointment, go ahead and check your blood pressure, pulse, oxygen saturation, and your weight (if you have the equipment to check those) and write them all down. When your visit starts, your provider will ask you for this information. If you have an Apple Watch or Kardia device, please plan to have heart rate information ready on the day of your appointment. Please have a pen and paper handy nearby the day of the visit as well."  5. Give patient instructions for MyChart download to smartphone OR Doximity/Doxy.me as below if video visit (depending on what platform provider is using)  6. Inform patient they will receive a phone call 15 minutes prior to their appointment time (may be from unknown caller ID) so they should be prepared to answer    TELEPHONE CALL NOTE  [name] [name] has been deemed a candidate for a follow-up tele-health visit to limit community exposure during the Covid-19 pandemic. I spoke with the patient via phone to ensure availability of phone/video source, confirm preferred email & phone number, and discuss instructions and expectations.  I reminded Mcclain Czepiel to be prepared with any vital sign and/or heart rhythm information that could potentially be obtained via home monitoring, at the time of her visit. I reminded Eliany Hempfling to expect a phone call prior  to her visit.  Pricilla HolmSharon B Ferguson 10/03/2018 9:27 AM   INSTRUCTIONS FOR DOWNLOADING THE MYCHART APP TO SMARTPHONE  - The patient must first make sure to have activated MyChart and know their login information - If Apple, go to [employer] Store and type in MyChart in the search bar and download the app. If Android, ask patient to go to [employer] Play Store and type in FrostMyChart in the search bar and download the app. The app is free but as with any other app downloads, their phone may  require them to verify saved payment information or Apple/Android password.  - The patient will need to then log into the app with their MyChart username and password, and select [facility] as their healthcare provider to link the account. When it is time for your visit, go to the MyChart app, find appointments, and click Begin Video Visit. Be sure to Select Allow for your device to access the Microphone and Camera for your visit. You will then be connected, and your provider will be with you shortly.  **If they have any issues connecting, or need assistance please contact MyChart service desk [phone])**  **If using a computer, in order to ensure the best quality for their visit they will need to use either of the following Internet Browsers: [employer] Edge, or Google Chrome**  IF USING DOXIMITY or DOXY.ME - The patient will receive a link just prior to their visit by text.     FULL LENGTH CONSENT FOR TELE-HEALTH VISIT   I hereby voluntarily request, consent and authorize [facility] and its employed or contracted physicians, physician assistants, nurse practitioners or other licensed health care professionals (the Practitioner), to provide me with telemedicine health care services (the Services") as deemed necessary by the treating Practitioner. I acknowledge and consent to receive the Services by the Practitioner via telemedicine. I understand that the telemedicine visit will involve communicating with the Practitioner through live audiovisual communication technology and the disclosure of certain medical information by electronic transmission. I acknowledge that I have been given the opportunity to request an in-person assessment or other available alternative prior to the telemedicine visit and am voluntarily participating in the telemedicine visit.  I understand that I have the right to withhold or withdraw my consent to the use of telemedicine in the course of my care at  any time, without affecting my right to future care or treatment, and that the Practitioner or I may terminate the telemedicine visit at any time. I understand that I have the right to inspect all information obtained and/or recorded in the course of the telemedicine visit and may receive copies of available information for a reasonable fee.  I understand that some of the potential risks of receiving the Services via telemedicine include:   Delay or interruption in medical evaluation due to technological equipment failure or disruption;  Information transmitted may not be sufficient (e.g. poor resolution of images) to allow for appropriate medical decision making by the Practitioner; and/or   In rare instances, security protocols could fail, causing a breach of personal health information.  Furthermore, I acknowledge that it is my responsibility to provide information about my medical history, conditions and care that is complete and accurate to the best of my ability. I acknowledge that Practitioner's advice, recommendations, and/or decision may be based on factors not within their control, such as incomplete or inaccurate data provided by me or distortions of diagnostic images or specimens that may result from electronic transmissions.  I understand that the practice of medicine is not an exact science and that Practitioner makes no warranties or guarantees regarding treatment outcomes. I acknowledge that I will receive a copy of this consent concurrently upon execution via email to the email address I last provided but may also request a printed copy by calling the office of [facility].    I understand that my insurance will be billed for this visit.   I have read or had this consent read to me.  I understand the contents of this consent, which adequately explains the benefits and risks of the Services being provided via telemedicine.   I have been provided ample opportunity to ask questions  regarding this consent and the Services and have had my questions answered to my satisfaction.  I give my informed consent for the services to be provided through the use of telemedicine in my medical care  By participating in this telemedicine visit I agree to the above.

## 2018-10-04 NOTE — Progress Notes (Signed)
Virtual Visit via Video Note   This visit type was conducted due to national recommendations for restrictions regarding the COVID-19 Pandemic (e.g. social distancing) in an effort to limit this patient's exposure and mitigate transmission in our community.  Due to her co-morbid illnesses, this patient is at least at moderate risk for complications without adequate follow up.  This format is felt to be most appropriate for this patient at this time.  All issues noted in this document were discussed and addressed.  A limited physical exam was performed with this format.  Please refer to the patient's chart for her consent to telehealth for [facility].  Evaluation Performed:  Follow-up visit  This visit type was conducted due to national recommendations for restrictions regarding the COVID-19 Pandemic (e.g. social distancing).  This format is felt to be most appropriate for this patient at this time.  All issues noted in this document were discussed and addressed.  No physical exam was performed (except for noted visual exam findings with Video Visits).  Please refer to the patient's chart (MyChart message for video visits and phone note for telephone visits) for the patient's consent to telehealth for [facility].  Date:  10/05/2018   ID:  [name] [name], DOB 12/28/1940, MRN [id number]  Patient Location:  Home  Provider location:   [place]  PCP:  Dellinger, [name], MD  Cardiologist:  Armanda Magic, MD  Electrophysiologist:  None   Chief Complaint:  PAF, HTN, CHF  History of Present Illness:    [name] [name] is a 78 y.o. female who presents via audio/video conferencing for a telehealth visit today.    [name] [name] is a 78 y.o. female with a hx of paroxysmal atrial fibrillation on chronic anti-coagulationwith Eliquis, hypertension, chronic systolic CHF, polymyalgia rheumatica on methotrexate and prednisone. 2D echocardiogram 10/08/2017 showed mildly reduced LV function with  EF 45 to 50% with diffuse hypokinesis and moderate mitral regurgitation.TEE on 10/11/2017 prior to cardioversion showed moderate to severe MR.  She was referred to Dr. Calton Dach for evaluation possible candidacy for mitral clip.  She does not walk much and uses a walker to get around the house because of severe arthritis and obesity and remains short of breath with any physical activity.  Dr. Excell Seltzer discussed percutaneous mitral valve repair with MitraClip with her.  At that point she wanted to continue medical therapy which he felt was reasonable given her morbid obesity and severely impaired functional capacity.  MitraClip would be reasonable if symptoms progressed but still higher than her normal risk patient given her morbid obesity.  Repeat 2D echo 06/28/2018 showed normal LV function with EF 55% and grade 2 diastolic dysfunction.  Left atrium was moderately dilated.  There was moderate MR And amlodipine 10 mg daily and moderately dilated a sending aorta.  She is here today for followup and is doing well.  She denies any chest pain or pressure, SOB, DOE, PND, orthopnea, dizziness, palpitations or syncope. She has chronic LE edema which improved at night.  I have encouraged her to get compression hose. Her PCP thinks she has lymphedema and has set her up with the lymphedema clinic.  She is compliant with her meds and is tolerating meds with no SE.    The patient does not have symptoms concerning for COVID-19 infection (fever, chills, cough, or new shortness of breath).    Prior CV studies:   The following studies were reviewed today:  2D echo 06/2018  Past Medical History:  Diagnosis Date  . Arthritis    "inflammatory" (10/17/2017)  . Ascending aorta dilatation (HCC)    43mm by echo 06/2018  . Female bladder prolapse   . Hypertension   . Mitral regurgitation 12/14/2017   Moderate to severe by TEE/DCCV 09-2017, moderate by echo 06/2018  . Morbid obesity (HCC)   . On home oxygen therapy     "2L; 24/7" (10/17/2017)  . PAF (paroxysmal atrial fibrillation) (HCC) paf  . Peripheral neuropathy    "BLE" (10/17/2017)  . Polymyalgia rheumatica (HCC)   . Recurrent UTI (urinary tract infection)    Past Surgical History:  Procedure Laterality Date  . ABDOMINAL HYSTERECTOMY    . CARDIOVERSION N/A 10/11/2017   Procedure: CARDIOVERSION;  Surgeon: Nahser, Deloris Ping, MD;  Location: [facility] ENDOSCOPY;  Service: Cardiovascular;  Laterality: N/A;  . CATARACT EXTRACTION W/ INTRAOCULAR LENS  IMPLANT, BILATERAL    . JOINT REPLACEMENT    . LAPAROSCOPIC CHOLECYSTECTOMY    . TEE WITHOUT CARDIOVERSION N/A 10/11/2017   Procedure: TRANSESOPHAGEAL ECHOCARDIOGRAM (TEE);  Surgeon: [name], MD;  Location[facility] ENDOSCOPY;  Service: Cardiovascular;  Laterality: N/A;  . TOTAL KNEE ARTHROPLASTY Left 2008     Current Meds  Medication Sig  . alendronate (FOSAMAX) 70 MG tablet Take 70 mg by mouth once a week.  Marland Kitchen amiodarone (PACERONE) 200 MG tablet Take 1 tablet (200 mg total) by mouth daily.  Marland Kitchen apixaban (ELIQUIS) 5 MG TABS tablet Take 1 tablet (5 mg total) by mouth 2 (two) times daily.  . carvedilol (COREG) 25 MG tablet TAKE 1 TABLET(25 MG) BY MOUTH TWICE DAILY WITH A MEAL  . cholecalciferol (VITAMIN D) 1000 units tablet Take 2,000 Units by mouth daily.  . colestipol (COLESTID) 1 g tablet Take 1 g by mouth daily.   . ferrous sulfate 325 (65 FE) MG tablet Take 325 mg by mouth daily with breakfast.  . folic acid (FOLVITE) 1 MG tablet Take 1 mg by mouth daily.  Marland Kitchen gabapentin (NEURONTIN) 300 MG capsule Take 300 mg by mouth at bedtime.  Marland Kitchen HYDROcodone-acetaminophen (NORCO) 10-325 MG tablet Take 1 tablet by mouth every 6 (six) hours as needed for pain.  Marland Kitchen losartan (COZAAR) 25 MG tablet TAKE 1 TABLET(25 MG) BY MOUTH DAILY  . methotrexate (RHEUMATREX) 2.5 MG tablet Take 15 mg by mouth once a week. Take on wednesdays  . potassium chloride (K-DUR) 10 MEQ tablet Take (3) 10 mg tablets by mouth in the morning and 2 (10) mg  tablets by mouth in the evening for a total of 50 mg per day.  . predniSONE (DELTASONE) 5 MG tablet Take 10 mg by mouth daily with breakfast.   . torsemide (DEMADEX) 20 MG tablet TAKE 3 TABS (60 MG) BY MOUTH EVERY AM, TAKE 2 TABS (40 MG) EVERY PM     Allergies:   Fluoxetine; Adhesive [tape]; Demerol [meperidine hcl]; and Sulfa antibiotics   Social History   Tobacco Use  . Smoking status: Former Smoker    Packs/day: 2.00    Years: 30.00    Pack years: 60.00    Types: Cigarettes    Last attempt to quit: 10/25/2006    Years since quitting: 11.9  . Smokeless tobacco: Never Used  Substance Use Topics  . Alcohol use: Not Currently  . Drug use: Never     Family Hx: The patient's family history includes Alzheimer's disease in her mother; Heart disease in her brother.  ROS:   Please see the history of present illness.  All other systems reviewed and are negative.   Labs/Other Tests and Data Reviewed:    Recent Labs: 10/17/2017: B Natriuretic Peptide 606.4 10/18/2017: Hemoglobin 11.4; Platelets 239 11/03/2017: Magnesium 2.1 12/22/2017: ALT 32; BUN 16; Creatinine, Ser 1.22; Potassium 4.0; Sodium 142; TSH 3.250   Recent Lipid Panel No results found for: CHOL, TRIG, HDL, CHOLHDL, LDLCALC, LDLDIRECT  Wt Readings from Last 3 Encounters:  10/05/18 274 lb 12.8 oz (124.6 kg)  03/20/18 284 lb 12.8 oz (129.2 kg)  01/11/18 286 lb 9.6 oz (130 kg)     Objective:    Vital Signs:  BP 130/84   Pulse 63   Ht 5\' 4"  (1.626 m)   Wt 274 lb 12.8 oz (124.6 kg)   SpO2 98%   BMI 47.17 kg/m    CONSTITUTIONAL:  Well nourished, well developed female in no acute distress.  EYES: anicteric MOUTH: oral mucosa is pink RESPIRATORY: Normal respiratory effort, symmetric expansion CARDIOVASCULAR: No peripheral edema SKIN: No rash, lesions or ulcers MUSCULOSKELETAL: no digital cyanosis NEURO: Cranial Nerves II-XII grossly intact, moves all extremities PSYCH: Intact judgement and insight.  A&O x 3,  Mood/affect appropriate   ASSESSMENT & PLAN:    1.  Paroxysmal atrial fibrillation -she has not had any problems with palpitations.  She is maintaining normal sinus rhythm she thinks.  She has not had any bleeding problems on Eliquis.  She will continue on carvedilol 25 mg twice daily, amiodarone 200 mg daily and Eliquis 5 mg twice daily.  Her last TSH was 3.25 in August.  Her ALT was 32.  She is due to get pulmonary function test with DLCO done which I will order in August.  I will repeat another ALT, CBC  and TSH in August.  2.  Hypertension - her blood pressure is controlled on exam.  She will continue on carvedilol 25 mg twice daily and losartan 25 mg daily.  Her creatinine was stable at 1.22 last summer.  I will recheck a bmet in August.  3.  Dilated cardiomyopathy -her last 2D echocardiogram showed normal LV function with EF 55%.  She does have grade 2 diastolic dysfunction.  4.  Chronic diastolic CHF -her weight is been stable and actually she has lost 10lbs since the fall.  She denies any shortness of breathbut does have chronic LE edema which her PCP thinks is related to lymphedema.  She will continue on torsemide 20 mg 3 tablets in the morning and 2 tablets in the evening.  Again I will get a bmet in August once because of his crisis has improved.  5.  Moderate to severe MR -she was evaluated prior by Dr. Excell Seltzerooper who felt she would be a candidate for MitraClip although higher risk given her morbid obesity, chronic anticoagulation and immunocompromise state as well as sedentary state.  At that time patient did not want to pursue MitraClip.  She has been stable on diuretic therapy.  Repeat 2D echocardiogram last February showed only moderate MR. repeat 2D echo in August. I will repeat a 2D echo 12/2018.  6.  Ascending aortic aneurysm - 2D echo in 06/2018 measured 4.3cm.  I will get a chest CTA and abdominal CTA to assess the entire aorta.    7. COVID-19 Education:The signs and symptoms of  COVID-19 were discussed with the patient and how to seek care for testing (follow up with PCP or arrange E-visit).  The importance of social distancing was discussed today.  Patient Risk:   After full review  of this patient's clinical status, I feel that they are at least moderate risk at this time.  Time:   Today, I have spent 12 minutes directly with the patient on video discussing medical problems including PAF< MR, HTN.  We also reviewed the symptoms of COVID 19 and the ways to protect against contracting the virus with telehealth technology.  I spent an additional 5 minutes reviewing patient's chart including 2D echo.  Medication Adjustments/Labs and Tests Ordered: Current medicines are reviewed at length with the patient today.  Concerns regarding medicines are outlined above.  Tests Ordered: No orders of the defined types were placed in this encounter.  Medication Changes: No orders of the defined types were placed in this encounter.   Disposition:  Follow up in 6 month(s)  Signed, Armanda Magic, MD  10/05/2018 11:32 AM    [facility]

## 2018-10-05 ENCOUNTER — Encounter: Payer: Self-pay | Admitting: Cardiology

## 2018-10-05 ENCOUNTER — Other Ambulatory Visit: Payer: Self-pay

## 2018-10-05 ENCOUNTER — Telehealth (INDEPENDENT_AMBULATORY_CARE_PROVIDER_SITE_OTHER): Payer: HMO | Admitting: Cardiology

## 2018-10-05 VITALS — BP 130/84 | HR 63 | Ht 64.0 in | Wt 274.8 lb

## 2018-10-05 DIAGNOSIS — I340 Nonrheumatic mitral (valve) insufficiency: Secondary | ICD-10-CM

## 2018-10-05 DIAGNOSIS — I5032 Chronic diastolic (congestive) heart failure: Secondary | ICD-10-CM

## 2018-10-05 DIAGNOSIS — I10 Essential (primary) hypertension: Secondary | ICD-10-CM | POA: Diagnosis not present

## 2018-10-05 DIAGNOSIS — I420 Dilated cardiomyopathy: Secondary | ICD-10-CM

## 2018-10-05 DIAGNOSIS — I480 Paroxysmal atrial fibrillation: Secondary | ICD-10-CM | POA: Diagnosis not present

## 2018-10-05 DIAGNOSIS — I77810 Thoracic aortic ectasia: Secondary | ICD-10-CM

## 2018-10-05 NOTE — Patient Instructions (Addendum)
Medication Instructions:  Your physician recommends that you continue on your current medications as directed. Please refer to the Current Medication list given to you today.  If you need a refill on your cardiac medications before your next appointment, please call your pharmacy.   Lab work: BMET, CBC, TSH and ALT on 12/25/18  If you have labs (blood work) drawn today and your tests are completely normal, you will receive your results only by: Marland Kitchen MyChart Message (if you have MyChart) OR . A paper copy in the mail If you have any lab test that is abnormal or we need to change your treatment, we will call you to review the results.  Testing/Procedures: Your physician has recommended that you have a pulmonary function test around August. Pulmonary Function Tests are a group of tests that measure how well air moves in and out of your lungs........[address]  Your physician has requested that you have an echocardiogram around August. Echocardiography is a painless test that uses sound waves to create images of your heart. It provides your doctor with information about the size and shape of your heart and how well your heart's chambers and valves are working. This procedure takes approximately one hour. There are no restrictions for this procedure.  Non-Cardiac CT Angiography (CTA) of abdomen and chest around August, is a special type of CT scan that uses a computer to produce multi-dimensional views of major blood vessels throughout the body. In CT angiography, a contrast material is injected through an IV to help visualize the blood vessels  Follow-Up: At [facility], you and your health needs are our priority.  As part of our continuing mission to provide you with exceptional heart care, we have created designated Provider Care Teams.  These Care Teams include your primary Cardiologist (physician) and Advanced Practice Providers (APPs -  Physician Assistants and Nurse Practitioners) who  all work together to provide you with the care you need, when you need it. You will need a follow up appointment in 6 months.  Please call our office 2 months in advance to schedule this appointment.  You may see Armanda Magic, MD or one of the following Advanced Practice Providers on your designated Care Team:   Eads, PA-C Ronie Spies, PA-C . Jacolyn Reedy, PA-C

## 2018-10-11 DIAGNOSIS — M170 Bilateral primary osteoarthritis of knee: Secondary | ICD-10-CM | POA: Diagnosis not present

## 2018-10-11 DIAGNOSIS — M353 Polymyalgia rheumatica: Secondary | ICD-10-CM | POA: Diagnosis not present

## 2018-10-14 ENCOUNTER — Other Ambulatory Visit: Payer: Self-pay | Admitting: Cardiology

## 2018-10-28 ENCOUNTER — Other Ambulatory Visit: Payer: Self-pay | Admitting: Cardiology

## 2018-11-05 ENCOUNTER — Other Ambulatory Visit: Payer: Self-pay | Admitting: Cardiology

## 2018-11-26 DIAGNOSIS — M353 Polymyalgia rheumatica: Secondary | ICD-10-CM | POA: Diagnosis not present

## 2018-11-26 DIAGNOSIS — M0609 Rheumatoid arthritis without rheumatoid factor, multiple sites: Secondary | ICD-10-CM | POA: Diagnosis not present

## 2018-11-26 DIAGNOSIS — Z79899 Other long term (current) drug therapy: Secondary | ICD-10-CM | POA: Diagnosis not present

## 2018-12-21 DIAGNOSIS — Z0000 Encounter for general adult medical examination without abnormal findings: Secondary | ICD-10-CM | POA: Diagnosis not present

## 2018-12-21 DIAGNOSIS — I77810 Thoracic aortic ectasia: Secondary | ICD-10-CM | POA: Diagnosis not present

## 2018-12-21 DIAGNOSIS — I480 Paroxysmal atrial fibrillation: Secondary | ICD-10-CM | POA: Diagnosis not present

## 2018-12-25 ENCOUNTER — Other Ambulatory Visit: Payer: Self-pay

## 2018-12-25 ENCOUNTER — Other Ambulatory Visit: Payer: HMO

## 2018-12-25 DIAGNOSIS — I480 Paroxysmal atrial fibrillation: Secondary | ICD-10-CM

## 2018-12-26 LAB — CBC
Hematocrit: 33.6 % — ABNORMAL LOW (ref 34.0–46.6)
Hemoglobin: 11.3 g/dL (ref 11.1–15.9)
MCH: 35.1 pg — ABNORMAL HIGH (ref 26.6–33.0)
MCHC: 33.6 g/dL (ref 31.5–35.7)
MCV: 104 fL — ABNORMAL HIGH (ref 79–97)
Platelets: 225 10*3/uL (ref 150–450)
RBC: 3.22 x10E6/uL — ABNORMAL LOW (ref 3.77–5.28)
RDW: 14.4 % (ref 11.7–15.4)
WBC: 7.4 10*3/uL (ref 3.4–10.8)

## 2018-12-26 LAB — BASIC METABOLIC PANEL
BUN/Creatinine Ratio: 11 — ABNORMAL LOW (ref 12–28)
BUN: 14 mg/dL (ref 8–27)
CO2: 26 mmol/L (ref 20–29)
Calcium: 8.8 mg/dL (ref 8.7–10.3)
Chloride: 95 mmol/L — ABNORMAL LOW (ref 96–106)
Creatinine, Ser: 1.25 mg/dL — ABNORMAL HIGH (ref 0.57–1.00)
GFR calc Af Amer: 47 mL/min/{1.73_m2} — ABNORMAL LOW (ref >59)
GFR calc non Af Amer: 41 mL/min/{1.73_m2} — ABNORMAL LOW (ref >59)
Glucose: 123 mg/dL — ABNORMAL HIGH (ref 65–99)
Potassium: 4.2 mmol/L (ref 3.5–5.2)
Sodium: 141 mmol/L (ref 134–144)

## 2018-12-26 LAB — ALT: ALT: 37 IU/L — ABNORMAL HIGH (ref 0–32)

## 2018-12-26 LAB — TSH: TSH: 3.62 u[IU]/mL (ref 0.450–4.500)

## 2018-12-31 DIAGNOSIS — I5021 Acute systolic (congestive) heart failure: Secondary | ICD-10-CM | POA: Diagnosis not present

## 2018-12-31 DIAGNOSIS — I5043 Acute on chronic combined systolic (congestive) and diastolic (congestive) heart failure: Secondary | ICD-10-CM | POA: Diagnosis not present

## 2018-12-31 DIAGNOSIS — I5022 Chronic systolic (congestive) heart failure: Secondary | ICD-10-CM | POA: Diagnosis not present

## 2018-12-31 DIAGNOSIS — S92309A Fracture of unspecified metatarsal bone(s), unspecified foot, initial encounter for closed fracture: Secondary | ICD-10-CM | POA: Diagnosis not present

## 2019-01-08 ENCOUNTER — Other Ambulatory Visit (HOSPITAL_COMMUNITY): Payer: HMO

## 2019-01-08 ENCOUNTER — Inpatient Hospital Stay: Admission: RE | Admit: 2019-01-08 | Payer: HMO | Source: Ambulatory Visit

## 2019-01-31 DIAGNOSIS — I5043 Acute on chronic combined systolic (congestive) and diastolic (congestive) heart failure: Secondary | ICD-10-CM | POA: Diagnosis not present

## 2019-01-31 DIAGNOSIS — I5021 Acute systolic (congestive) heart failure: Secondary | ICD-10-CM | POA: Diagnosis not present

## 2019-01-31 DIAGNOSIS — S92309A Fracture of unspecified metatarsal bone(s), unspecified foot, initial encounter for closed fracture: Secondary | ICD-10-CM | POA: Diagnosis not present

## 2019-01-31 DIAGNOSIS — I5022 Chronic systolic (congestive) heart failure: Secondary | ICD-10-CM | POA: Diagnosis not present

## 2019-02-07 ENCOUNTER — Other Ambulatory Visit: Payer: Self-pay

## 2019-02-07 NOTE — Patient Outreach (Signed)
  [facility]) Care Management Chronic Special Needs Program  02/07/2019  Name: [name] [name] DOB: [birth year]  MRN: [id number]  Ms. [name] [name] is enrolled in a chronic special needs plan for Heart Failure. Reviewed and updated care plan.  Subjective: client reports she is doing well. She reports she has followed up with providers as scheduled and denies transportation needs. Client states she has all medications and denies any issues with obtaining medications. [name] reports she does not weigh daily as her balance to stand on scale is not good and that peripheral neuropathy impacts the standing-client request daily weights to be taking off goals. She states she know what is normal for her body and will call provider for any other signs/symptoms of exacerbation.  Goals Addressed            This Visit's Progress   . COMPLETED: Client understands the importance of follow-up with providers by attending scheduled visits       Voiced importance of attending follow up visits as scheduled.    . COMPLETED: Client will report no worsening of symptoms of Atrial Fibrillation within the next 6 months       Denies any signs/symptoms.    . COMPLETED: Client will report no worsening of symptoms related to heart disease within the next 6 months        Denies any symptoms.    . COMPLETED: Client will verbalize knowledge of Heart Failure disease self management skills within the next 6 months.       Voiced takes medications as scheduled; monitors salt intake; follows up with providers when scheduled and as needed.    . COMPLETED: Client will verbalize knowledge of self management of Hypertension as evidences by BP reading of 140/90 or less; or as defined by provider       Takes medications, monitors salt intake, follows up with providers, BP less than 140/90.    Marland Kitchen COMPLETED: Maintain timely refills of Heart Failure medication as prescribed within the year        Denies any  issues.    . Obtain annual  Lipid Profile, LDL-C   On track   . COMPLETED: Visit Primary Care Provider or Cardiologist at least 2 times per year       Cardiologist visit 10/05/2018 and PCP 06/26/2018 and 12/21/2018      RNCM reviewed signs/symptoms of heart failure exacerbation. Discussed follow up appointments and encouraged client to call providers as needed.   Covid-19 precautions discussed. RNCM reinforced the 24 hour nurse advice line availability, encouraged client to call healthcare concierge with benefits questions; encouraged client to call RNCM as needed.  Plan: send updated care plan to client; send updated care plan to primary care. RNCM will outreach within 6 months.   Thea Silversmith, RN, MSN, [place] [phone]   .

## 2019-02-22 ENCOUNTER — Telehealth: Payer: Self-pay | Admitting: *Deleted

## 2019-02-22 NOTE — Telephone Encounter (Signed)
Pt cancelled echo and CT scans that were scheduled in August.  I placed call to pt to see if she would like to reschedule these.  Left message to call office.

## 2019-02-22 NOTE — Telephone Encounter (Signed)
Follow Up  Patient returning call. Patient states that she has a hard time breathing while wearing a mask and will call back to reschedule once things go back to normal and COVID-19 is more controlled to the point where she does not have to have a mask.

## 2019-02-25 ENCOUNTER — Other Ambulatory Visit: Payer: Self-pay | Admitting: Cardiology

## 2019-02-25 DIAGNOSIS — R509 Fever, unspecified: Secondary | ICD-10-CM | POA: Diagnosis not present

## 2019-02-25 DIAGNOSIS — Z1159 Encounter for screening for other viral diseases: Secondary | ICD-10-CM | POA: Diagnosis not present

## 2019-02-25 DIAGNOSIS — U071 COVID-19: Secondary | ICD-10-CM | POA: Diagnosis not present

## 2019-02-25 DIAGNOSIS — Z20828 Contact with and (suspected) exposure to other viral communicable diseases: Secondary | ICD-10-CM | POA: Diagnosis not present

## 2019-02-25 MED ORDER — TORSEMIDE 20 MG PO TABS: ORAL_TABLET | ORAL | 1 refills | Status: AC

## 2019-02-26 ENCOUNTER — Inpatient Hospital Stay (HOSPITAL_COMMUNITY): Payer: HMO

## 2019-02-26 ENCOUNTER — Encounter (HOSPITAL_COMMUNITY): Payer: Self-pay

## 2019-02-26 ENCOUNTER — Emergency Department (HOSPITAL_COMMUNITY): Payer: HMO

## 2019-02-26 ENCOUNTER — Other Ambulatory Visit: Payer: Self-pay

## 2019-02-26 ENCOUNTER — Inpatient Hospital Stay (HOSPITAL_COMMUNITY)
Admission: EM | Admit: 2019-02-26 | Discharge: 2019-03-17 | DRG: 207 | Disposition: E | Payer: HMO | Attending: Internal Medicine | Admitting: Internal Medicine

## 2019-02-26 DIAGNOSIS — R531 Weakness: Secondary | ICD-10-CM | POA: Diagnosis not present

## 2019-02-26 DIAGNOSIS — R739 Hyperglycemia, unspecified: Secondary | ICD-10-CM | POA: Diagnosis not present

## 2019-02-26 DIAGNOSIS — J690 Pneumonitis due to inhalation of food and vomit: Secondary | ICD-10-CM | POA: Diagnosis not present

## 2019-02-26 DIAGNOSIS — R092 Respiratory arrest: Secondary | ICD-10-CM | POA: Diagnosis present

## 2019-02-26 DIAGNOSIS — N179 Acute kidney failure, unspecified: Secondary | ICD-10-CM | POA: Diagnosis not present

## 2019-02-26 DIAGNOSIS — I480 Paroxysmal atrial fibrillation: Secondary | ICD-10-CM | POA: Diagnosis not present

## 2019-02-26 DIAGNOSIS — G931 Anoxic brain damage, not elsewhere classified: Secondary | ICD-10-CM | POA: Diagnosis not present

## 2019-02-26 DIAGNOSIS — G9340 Encephalopathy, unspecified: Secondary | ICD-10-CM | POA: Diagnosis not present

## 2019-02-26 DIAGNOSIS — I712 Thoracic aortic aneurysm, without rupture: Secondary | ICD-10-CM | POA: Diagnosis present

## 2019-02-26 DIAGNOSIS — R58 Hemorrhage, not elsewhere classified: Secondary | ICD-10-CM | POA: Diagnosis not present

## 2019-02-26 DIAGNOSIS — M353 Polymyalgia rheumatica: Secondary | ICD-10-CM | POA: Diagnosis present

## 2019-02-26 DIAGNOSIS — J9600 Acute respiratory failure, unspecified whether with hypoxia or hypercapnia: Secondary | ICD-10-CM

## 2019-02-26 DIAGNOSIS — R140 Abdominal distension (gaseous): Secondary | ICD-10-CM | POA: Diagnosis not present

## 2019-02-26 DIAGNOSIS — I469 Cardiac arrest, cause unspecified: Secondary | ICD-10-CM | POA: Diagnosis not present

## 2019-02-26 DIAGNOSIS — E669 Obesity, unspecified: Secondary | ICD-10-CM | POA: Diagnosis present

## 2019-02-26 DIAGNOSIS — I361 Nonrheumatic tricuspid (valve) insufficiency: Secondary | ICD-10-CM | POA: Diagnosis not present

## 2019-02-26 DIAGNOSIS — S3681XA Injury of peritoneum, initial encounter: Secondary | ICD-10-CM | POA: Diagnosis not present

## 2019-02-26 DIAGNOSIS — E870 Hyperosmolality and hypernatremia: Secondary | ICD-10-CM | POA: Diagnosis not present

## 2019-02-26 DIAGNOSIS — T380X5A Adverse effect of glucocorticoids and synthetic analogues, initial encounter: Secondary | ICD-10-CM | POA: Diagnosis not present

## 2019-02-26 DIAGNOSIS — R7301 Impaired fasting glucose: Secondary | ICD-10-CM | POA: Diagnosis present

## 2019-02-26 DIAGNOSIS — J9690 Respiratory failure, unspecified, unspecified whether with hypoxia or hypercapnia: Secondary | ICD-10-CM

## 2019-02-26 DIAGNOSIS — E860 Dehydration: Secondary | ICD-10-CM | POA: Diagnosis present

## 2019-02-26 DIAGNOSIS — Z7952 Long term (current) use of systemic steroids: Secondary | ICD-10-CM

## 2019-02-26 DIAGNOSIS — I700 Atherosclerosis of aorta: Secondary | ICD-10-CM | POA: Diagnosis present

## 2019-02-26 DIAGNOSIS — U071 COVID-19: Principal | ICD-10-CM | POA: Diagnosis present

## 2019-02-26 DIAGNOSIS — Z9842 Cataract extraction status, left eye: Secondary | ICD-10-CM

## 2019-02-26 DIAGNOSIS — Z4689 Encounter for fitting and adjustment of other specified devices: Secondary | ICD-10-CM | POA: Diagnosis not present

## 2019-02-26 DIAGNOSIS — Z4682 Encounter for fitting and adjustment of non-vascular catheter: Secondary | ICD-10-CM | POA: Diagnosis not present

## 2019-02-26 DIAGNOSIS — I77810 Thoracic aortic ectasia: Secondary | ICD-10-CM | POA: Diagnosis not present

## 2019-02-26 DIAGNOSIS — Z6841 Body Mass Index (BMI) 40.0 and over, adult: Secondary | ICD-10-CM | POA: Diagnosis not present

## 2019-02-26 DIAGNOSIS — I4510 Unspecified right bundle-branch block: Secondary | ICD-10-CM | POA: Diagnosis present

## 2019-02-26 DIAGNOSIS — Z8249 Family history of ischemic heart disease and other diseases of the circulatory system: Secondary | ICD-10-CM

## 2019-02-26 DIAGNOSIS — Z79899 Other long term (current) drug therapy: Secondary | ICD-10-CM

## 2019-02-26 DIAGNOSIS — Y848 Other medical procedures as the cause of abnormal reaction of the patient, or of later complication, without mention of misadventure at the time of the procedure: Secondary | ICD-10-CM | POA: Diagnosis not present

## 2019-02-26 DIAGNOSIS — J80 Acute respiratory distress syndrome: Secondary | ICD-10-CM | POA: Diagnosis not present

## 2019-02-26 DIAGNOSIS — S92309A Fracture of unspecified metatarsal bone(s), unspecified foot, initial encounter for closed fracture: Secondary | ICD-10-CM | POA: Diagnosis not present

## 2019-02-26 DIAGNOSIS — Z885 Allergy status to narcotic agent status: Secondary | ICD-10-CM

## 2019-02-26 DIAGNOSIS — I10 Essential (primary) hypertension: Secondary | ICD-10-CM | POA: Diagnosis present

## 2019-02-26 DIAGNOSIS — R29818 Other symptoms and signs involving the nervous system: Secondary | ICD-10-CM | POA: Diagnosis not present

## 2019-02-26 DIAGNOSIS — Z95828 Presence of other vascular implants and grafts: Secondary | ICD-10-CM | POA: Diagnosis not present

## 2019-02-26 DIAGNOSIS — R918 Other nonspecific abnormal finding of lung field: Secondary | ICD-10-CM | POA: Diagnosis not present

## 2019-02-26 DIAGNOSIS — I110 Hypertensive heart disease with heart failure: Secondary | ICD-10-CM | POA: Diagnosis present

## 2019-02-26 DIAGNOSIS — D62 Acute posthemorrhagic anemia: Secondary | ICD-10-CM | POA: Diagnosis not present

## 2019-02-26 DIAGNOSIS — Z515 Encounter for palliative care: Secondary | ICD-10-CM | POA: Diagnosis not present

## 2019-02-26 DIAGNOSIS — Z820 Family history of epilepsy and other diseases of the nervous system: Secondary | ICD-10-CM

## 2019-02-26 DIAGNOSIS — Y92238 Other place in hospital as the place of occurrence of the external cause: Secondary | ICD-10-CM | POA: Diagnosis not present

## 2019-02-26 DIAGNOSIS — X58XXXA Exposure to other specified factors, initial encounter: Secondary | ICD-10-CM | POA: Diagnosis not present

## 2019-02-26 DIAGNOSIS — R7881 Bacteremia: Secondary | ICD-10-CM | POA: Diagnosis not present

## 2019-02-26 DIAGNOSIS — Z96652 Presence of left artificial knee joint: Secondary | ICD-10-CM | POA: Diagnosis present

## 2019-02-26 DIAGNOSIS — K5900 Constipation, unspecified: Secondary | ICD-10-CM | POA: Diagnosis not present

## 2019-02-26 DIAGNOSIS — I517 Cardiomegaly: Secondary | ICD-10-CM | POA: Diagnosis not present

## 2019-02-26 DIAGNOSIS — J9621 Acute and chronic respiratory failure with hypoxia: Secondary | ICD-10-CM | POA: Diagnosis not present

## 2019-02-26 DIAGNOSIS — G9341 Metabolic encephalopathy: Secondary | ICD-10-CM | POA: Diagnosis not present

## 2019-02-26 DIAGNOSIS — S225XXA Flail chest, initial encounter for closed fracture: Secondary | ICD-10-CM | POA: Diagnosis not present

## 2019-02-26 DIAGNOSIS — R7401 Elevation of levels of liver transaminase levels: Secondary | ICD-10-CM | POA: Diagnosis present

## 2019-02-26 DIAGNOSIS — S301XXA Contusion of abdominal wall, initial encounter: Secondary | ICD-10-CM | POA: Diagnosis not present

## 2019-02-26 DIAGNOSIS — I5022 Chronic systolic (congestive) heart failure: Secondary | ICD-10-CM | POA: Diagnosis not present

## 2019-02-26 DIAGNOSIS — R0689 Other abnormalities of breathing: Secondary | ICD-10-CM | POA: Diagnosis not present

## 2019-02-26 DIAGNOSIS — Z882 Allergy status to sulfonamides status: Secondary | ICD-10-CM

## 2019-02-26 DIAGNOSIS — Z79891 Long term (current) use of opiate analgesic: Secondary | ICD-10-CM

## 2019-02-26 DIAGNOSIS — I5021 Acute systolic (congestive) heart failure: Secondary | ICD-10-CM | POA: Diagnosis not present

## 2019-02-26 DIAGNOSIS — I420 Dilated cardiomyopathy: Secondary | ICD-10-CM | POA: Diagnosis not present

## 2019-02-26 DIAGNOSIS — R7303 Prediabetes: Secondary | ICD-10-CM | POA: Diagnosis present

## 2019-02-26 DIAGNOSIS — I959 Hypotension, unspecified: Secondary | ICD-10-CM | POA: Diagnosis not present

## 2019-02-26 DIAGNOSIS — R0902 Hypoxemia: Secondary | ICD-10-CM | POA: Diagnosis not present

## 2019-02-26 DIAGNOSIS — Z7901 Long term (current) use of anticoagulants: Secondary | ICD-10-CM

## 2019-02-26 DIAGNOSIS — J1289 Other viral pneumonia: Secondary | ICD-10-CM | POA: Diagnosis not present

## 2019-02-26 DIAGNOSIS — Z91048 Other nonmedicinal substance allergy status: Secondary | ICD-10-CM

## 2019-02-26 DIAGNOSIS — M064 Inflammatory polyarthropathy: Secondary | ICD-10-CM | POA: Diagnosis present

## 2019-02-26 DIAGNOSIS — I340 Nonrheumatic mitral (valve) insufficiency: Secondary | ICD-10-CM | POA: Diagnosis present

## 2019-02-26 DIAGNOSIS — Z90710 Acquired absence of both cervix and uterus: Secondary | ICD-10-CM

## 2019-02-26 DIAGNOSIS — E876 Hypokalemia: Secondary | ICD-10-CM | POA: Diagnosis not present

## 2019-02-26 DIAGNOSIS — Z9841 Cataract extraction status, right eye: Secondary | ICD-10-CM

## 2019-02-26 DIAGNOSIS — Z66 Do not resuscitate: Secondary | ICD-10-CM | POA: Diagnosis not present

## 2019-02-26 DIAGNOSIS — Z9981 Dependence on supplemental oxygen: Secondary | ICD-10-CM

## 2019-02-26 DIAGNOSIS — Z888 Allergy status to other drugs, medicaments and biological substances status: Secondary | ICD-10-CM

## 2019-02-26 DIAGNOSIS — R0602 Shortness of breath: Secondary | ICD-10-CM | POA: Diagnosis not present

## 2019-02-26 DIAGNOSIS — Z87440 Personal history of urinary (tract) infections: Secondary | ICD-10-CM

## 2019-02-26 DIAGNOSIS — Z961 Presence of intraocular lens: Secondary | ICD-10-CM | POA: Diagnosis present

## 2019-02-26 DIAGNOSIS — I2510 Atherosclerotic heart disease of native coronary artery without angina pectoris: Secondary | ICD-10-CM | POA: Diagnosis present

## 2019-02-26 DIAGNOSIS — J1282 Pneumonia due to coronavirus disease 2019: Secondary | ICD-10-CM | POA: Diagnosis present

## 2019-02-26 DIAGNOSIS — I5043 Acute on chronic combined systolic (congestive) and diastolic (congestive) heart failure: Secondary | ICD-10-CM | POA: Diagnosis not present

## 2019-02-26 DIAGNOSIS — J9601 Acute respiratory failure with hypoxia: Secondary | ICD-10-CM | POA: Diagnosis not present

## 2019-02-26 DIAGNOSIS — Z87891 Personal history of nicotine dependence: Secondary | ICD-10-CM

## 2019-02-26 LAB — CBC WITH DIFFERENTIAL/PLATELET
Abs Immature Granulocytes: 0.06 10*3/uL (ref 0.00–0.07)
Basophils Absolute: 0 10*3/uL (ref 0.0–0.1)
Basophils Relative: 0 %
Eosinophils Absolute: 0 10*3/uL (ref 0.0–0.5)
Eosinophils Relative: 0 %
HCT: 35.4 % — ABNORMAL LOW (ref 36.0–46.0)
Hemoglobin: 11.7 g/dL — ABNORMAL LOW (ref 12.0–15.0)
Immature Granulocytes: 1 %
Lymphocytes Relative: 22 %
Lymphs Abs: 1.4 10*3/uL (ref 0.7–4.0)
MCH: 34.6 pg — ABNORMAL HIGH (ref 26.0–34.0)
MCHC: 33.1 g/dL (ref 30.0–36.0)
MCV: 104.7 fL — ABNORMAL HIGH (ref 80.0–100.0)
Monocytes Absolute: 0.6 10*3/uL (ref 0.1–1.0)
Monocytes Relative: 9 %
Neutro Abs: 4.4 10*3/uL (ref 1.7–7.7)
Neutrophils Relative %: 68 %
Platelets: 145 10*3/uL — ABNORMAL LOW (ref 150–400)
RBC: 3.38 MIL/uL — ABNORMAL LOW (ref 3.87–5.11)
RDW: 14.6 % (ref 11.5–15.5)
WBC: 6.5 10*3/uL (ref 4.0–10.5)
nRBC: 0 % (ref 0.0–0.2)

## 2019-02-26 LAB — URINALYSIS, ROUTINE W REFLEX MICROSCOPIC
Bacteria, UA: NONE SEEN
Bilirubin Urine: NEGATIVE
Glucose, UA: NEGATIVE mg/dL
Ketones, ur: 5 mg/dL — AB
Nitrite: NEGATIVE
Protein, ur: 100 mg/dL — AB
Specific Gravity, Urine: 1.013 (ref 1.005–1.030)
WBC, UA: >50 WBC/hpf — ABNORMAL HIGH (ref 0–5)
pH: 6 (ref 5.0–8.0)

## 2019-02-26 LAB — BASIC METABOLIC PANEL
Anion gap: 14 (ref 5–15)
BUN: 18 mg/dL (ref 8–23)
CO2: 21 mmol/L — ABNORMAL LOW (ref 22–32)
Calcium: 7.3 mg/dL — ABNORMAL LOW (ref 8.9–10.3)
Chloride: 98 mmol/L (ref 98–111)
Creatinine, Ser: 1.44 mg/dL — ABNORMAL HIGH (ref 0.44–1.00)
GFR calc Af Amer: 40 mL/min — ABNORMAL LOW (ref >60)
GFR calc non Af Amer: 34 mL/min — ABNORMAL LOW (ref >60)
Glucose, Bld: 214 mg/dL — ABNORMAL HIGH (ref 70–99)
Potassium: 3.8 mmol/L (ref 3.5–5.1)
Sodium: 133 mmol/L — ABNORMAL LOW (ref 135–145)

## 2019-02-26 LAB — LACTATE DEHYDROGENASE: LDH: 407 U/L — ABNORMAL HIGH (ref 98–192)

## 2019-02-26 LAB — LACTIC ACID, PLASMA: Lactic Acid, Venous: 1.2 mmol/L (ref 0.5–1.9)

## 2019-02-26 LAB — CBC
HCT: 34.6 % — ABNORMAL LOW (ref 36.0–46.0)
Hemoglobin: 11.1 g/dL — ABNORMAL LOW (ref 12.0–15.0)
MCH: 34.8 pg — ABNORMAL HIGH (ref 26.0–34.0)
MCHC: 32.1 g/dL (ref 30.0–36.0)
MCV: 108.5 fL — ABNORMAL HIGH (ref 80.0–100.0)
Platelets: 155 10*3/uL (ref 150–400)
RBC: 3.19 MIL/uL — ABNORMAL LOW (ref 3.87–5.11)
RDW: 14.9 % (ref 11.5–15.5)
WBC: 10.7 10*3/uL — ABNORMAL HIGH (ref 4.0–10.5)
nRBC: 0.3 % — ABNORMAL HIGH (ref 0.0–0.2)

## 2019-02-26 LAB — COMPREHENSIVE METABOLIC PANEL
ALT: 35 U/L (ref 0–44)
AST: 55 U/L — ABNORMAL HIGH (ref 15–41)
Albumin: 3.5 g/dL (ref 3.5–5.0)
Alkaline Phosphatase: 48 U/L (ref 38–126)
Anion gap: 15 (ref 5–15)
BUN: 17 mg/dL (ref 8–23)
CO2: 27 mmol/L (ref 22–32)
Calcium: 8.3 mg/dL — ABNORMAL LOW (ref 8.9–10.3)
Chloride: 92 mmol/L — ABNORMAL LOW (ref 98–111)
Creatinine, Ser: 1.42 mg/dL — ABNORMAL HIGH (ref 0.44–1.00)
GFR calc Af Amer: 41 mL/min — ABNORMAL LOW (ref >60)
GFR calc non Af Amer: 35 mL/min — ABNORMAL LOW (ref >60)
Glucose, Bld: 111 mg/dL — ABNORMAL HIGH (ref 70–99)
Potassium: 3.2 mmol/L — ABNORMAL LOW (ref 3.5–5.1)
Sodium: 134 mmol/L — ABNORMAL LOW (ref 135–145)
Total Bilirubin: 0.9 mg/dL (ref 0.3–1.2)
Total Protein: 7.2 g/dL (ref 6.5–8.1)

## 2019-02-26 LAB — PROCALCITONIN: Procalcitonin: 12.8 ng/mL

## 2019-02-26 LAB — PROTIME-INR
INR: 1.3 — ABNORMAL HIGH (ref 0.8–1.2)
Prothrombin Time: 15.7 seconds — ABNORMAL HIGH (ref 11.4–15.2)

## 2019-02-26 LAB — CBG MONITORING, ED: Glucose-Capillary: 115 mg/dL — ABNORMAL HIGH (ref 70–99)

## 2019-02-26 LAB — BLOOD GAS, ARTERIAL
Acid-base deficit: 5.7 mmol/L — ABNORMAL HIGH (ref 0.0–2.0)
Bicarbonate: 21.4 mmol/L (ref 20.0–28.0)
FIO2: 100
O2 Saturation: 93.1 %
Patient temperature: 100.5
pCO2 arterial: 54.4 mmHg — ABNORMAL HIGH (ref 32.0–48.0)
pH, Arterial: 7.227 — ABNORMAL LOW (ref 7.350–7.450)
pO2, Arterial: 87.9 mmHg (ref 83.0–108.0)

## 2019-02-26 LAB — D-DIMER, QUANTITATIVE: D-Dimer, Quant: 19.68 ug/mL-FEU — ABNORMAL HIGH (ref 0.00–0.50)

## 2019-02-26 LAB — TROPONIN I (HIGH SENSITIVITY)
Troponin I (High Sensitivity): 313 ng/L (ref <18)
Troponin I (High Sensitivity): 444 ng/L (ref <18)

## 2019-02-26 LAB — APTT: aPTT: 43 seconds — ABNORMAL HIGH (ref 24–36)

## 2019-02-26 MED ORDER — IOHEXOL 350 MG/ML SOLN
100.0000 mL | Freq: Once | INTRAVENOUS | Status: AC | PRN
  Administered 2019-02-27: 80 mL via INTRAVENOUS

## 2019-02-26 MED ORDER — DEXAMETHASONE SODIUM PHOSPHATE 10 MG/ML IJ SOLN
8.0000 mg | Freq: Two times a day (BID) | INTRAMUSCULAR | Status: DC
  Administered 2019-02-26 – 2019-03-03 (×10): 8 mg via INTRAVENOUS
  Filled 2019-02-26 (×10): qty 1

## 2019-02-26 MED ORDER — ACETAMINOPHEN 650 MG RE SUPP
650.0000 mg | Freq: Once | RECTAL | Status: AC
  Administered 2019-02-26: 650 mg via RECTAL
  Filled 2019-02-26: qty 1

## 2019-02-26 MED ORDER — NOREPINEPHRINE 4 MG/250ML-% IV SOLN
0.0000 ug/min | INTRAVENOUS | Status: DC
  Filled 2019-02-26: qty 250

## 2019-02-26 MED ORDER — SODIUM CHLORIDE (PF) 0.9 % IJ SOLN
INTRAMUSCULAR | Status: AC
  Filled 2019-02-26: qty 50

## 2019-02-26 MED ORDER — SODIUM CHLORIDE 0.9 % IV SOLN
100.0000 mg | INTRAVENOUS | Status: AC
  Administered 2019-02-27 – 2019-03-02 (×4): 100 mg via INTRAVENOUS
  Filled 2019-02-26 (×5): qty 20

## 2019-02-26 MED ORDER — LEVOFLOXACIN IN D5W 500 MG/100ML IV SOLN
500.0000 mg | Freq: Once | INTRAVENOUS | Status: AC
  Administered 2019-02-26: 500 mg via INTRAVENOUS
  Filled 2019-02-26: qty 100

## 2019-02-26 MED ORDER — SODIUM CHLORIDE 0.9 % IV BOLUS
1000.0000 mL | Freq: Once | INTRAVENOUS | Status: AC
  Administered 2019-02-26: 1000 mL via INTRAVENOUS

## 2019-02-26 MED ORDER — DEXAMETHASONE SODIUM PHOSPHATE 10 MG/ML IJ SOLN
10.0000 mg | Freq: Once | INTRAMUSCULAR | Status: AC
  Administered 2019-02-26: 10 mg via INTRAVENOUS
  Filled 2019-02-26: qty 1

## 2019-02-26 MED ORDER — POTASSIUM CHLORIDE 10 MEQ/100ML IV SOLN
10.0000 meq | Freq: Once | INTRAVENOUS | Status: AC
  Administered 2019-02-26: 10 meq via INTRAVENOUS
  Filled 2019-02-26: qty 100

## 2019-02-26 MED ORDER — ENOXAPARIN SODIUM 40 MG/0.4ML ~~LOC~~ SOLN
40.0000 mg | Freq: Every day | SUBCUTANEOUS | Status: DC
  Filled 2019-02-26: qty 0.4

## 2019-02-26 MED ORDER — ONDANSETRON HCL 4 MG/2ML IJ SOLN: 4.0000 mg | Freq: Four times a day (QID) | INTRAMUSCULAR | Status: DC | PRN

## 2019-02-26 MED ORDER — SODIUM CHLORIDE 0.9 % IV SOLN
200.0000 mg | Freq: Once | INTRAVENOUS | Status: AC
  Administered 2019-02-27: 200 mg via INTRAVENOUS
  Filled 2019-02-26: qty 40

## 2019-02-26 MED ORDER — SODIUM CHLORIDE 0.9 % IV SOLN
INTRAVENOUS | Status: DC
  Administered 2019-02-26: 23:00:00 via INTRAVENOUS

## 2019-02-26 MED ORDER — SODIUM CHLORIDE 0.9 % IV SOLN
INTRAVENOUS | Status: DC
  Administered 2019-02-27 – 2019-03-03 (×6): via INTRAVENOUS

## 2019-02-26 MED ORDER — ACETAMINOPHEN 325 MG PO TABS: 650.0000 mg | ORAL_TABLET | ORAL | Status: DC | PRN

## 2019-02-26 MED ORDER — SODIUM CHLORIDE 0.9 % IV BOLUS
500.0000 mL | Freq: Once | INTRAVENOUS | Status: AC
  Administered 2019-02-26: 500 mL via INTRAVENOUS

## 2019-02-26 MED ORDER — ALBUTEROL SULFATE HFA 108 (90 BASE) MCG/ACT IN AERS
4.0000 | INHALATION_SPRAY | Freq: Once | RESPIRATORY_TRACT | Status: AC
  Administered 2019-02-26: 4 via RESPIRATORY_TRACT
  Filled 2019-02-26: qty 6.7

## 2019-02-26 MED ORDER — ASPIRIN 300 MG RE SUPP
300.0000 mg | RECTAL | Status: AC
  Administered 2019-02-26: 300 mg via RECTAL
  Filled 2019-02-26: qty 1

## 2019-02-26 MED ORDER — SODIUM CHLORIDE 0.9 % IV SOLN: 1.0000 g | Freq: Once | INTRAVENOUS | Status: DC

## 2019-02-26 MED ORDER — FAMOTIDINE IN NACL 20-0.9 MG/50ML-% IV SOLN
20.0000 mg | Freq: Every day | INTRAVENOUS | Status: DC
  Administered 2019-02-26 – 2019-03-01 (×4): 20 mg via INTRAVENOUS
  Filled 2019-02-26 (×4): qty 50

## 2019-02-26 NOTE — ED Notes (Signed)
Called Critical care for Dr Roderic Palau @2018 

## 2019-02-26 NOTE — H&P (Signed)
NAME:  Jilda RocheBetty Pelle, MRN:  [id number], DOB:  02/26/1941, LOS: 0 ADMISSION DATE:  02/21/2019, CONSULTATION DATE: 02/22/2019 REFERRING MD: ED, CHIEF COMPLAINT: New Covid diagnosis, hypoxemia, status post arrest  Brief History   Patient diagnosed as being Covid positive yesterday presented with increased cough shortness of breath while being initially evaluated in the emergency room patient had a pulseless arrest.  Rhythm is unknown.  Patient responded 3 doses of epinephrine during CPR with return of rhythm and blood pressure.  History of present illness   Patient is a 78 year old female who had a return of the positive Covid test yesterday.  It is unclear when the test was initially done or what her symptoms were at that time.  Nursing reports that as they were about to go into evaluate her at 7:00 shift change she had become progressively hypoxemic and dyspneic with elevated respiratory rate.  As nursing was going to go in the room the patient became pulseless and apneic.  It is unknown and that her leads had come off.  During CPR her rhythm was found to be asystole.  Patient was intubated with return of pulse normal rhythm.  Etiology of her arrest is not clear at this time.  Opponent was elevated at 3013.  Arterial blood gas post intubation shows a pH of 7.226, PCO2 54, PO2 of 87.  Other pertinent labs her creatinine of 1.42 slightly elevated off of baseline is likely secondary to dehydration.  LDH is 407 postarrest troponin is 313.  CBC was unremarkable  Past Medical History   . Arthritis    "inflammatory" (10/17/2017)  . Ascending aorta dilatation (HCC)    43mm by echo 06/2018  . Female bladder prolapse   . Hypertension   . Mitral regurgitation 12/14/2017   Moderate to severe by TEE/DCCV 09-2017, moderate by echo 06/2018  . Morbid obesity (HCC)   . On home oxygen therapy    "2L; 24/7" (10/17/2017)  . PAF (paroxysmal atrial fibrillation) (HCC) paf  . Peripheral neuropathy    "BLE"  (10/17/2017)  . Polymyalgia rheumatica (HCC)   . Recurrent UTI (urinary tract infection)      Significant Hospital Events   Status post arrest 03/09/2019  Consults:  N/A  Procedures:  Intubation  Significant Diagnostic Tests:  N/A  Micro Data:  Reportedly Covid positive with repeat test still pending  Antimicrobials:  Levaquin initiated  Interim history/subjective:  N/A  Objective   Blood pressure 133/74, pulse 80, temperature (!) 102.7 F (39.3 C), resp. rate (!) 34, height 5\' 4"  (1.626 m), SpO2 93 %.    Vent Mode: PRVC FiO2 (%):  [100 %] 100 % Set Rate:  [14 bmp-18 bmp] 18 bmp Vt Set:  [460 mL] 460 mL PEEP:  [5 cmH20] 5 cmH20 Plateau Pressure:  [28 cmH20] 28 cmH20  No intake or output data in the 24 hours ending 03/14/2019 2205 There were no vitals filed for this visit.  Examination: General: obese wf  HENT: Intubated Lungs: dimisnished Cardiovascular: reg rate rhythm Abdomen: rotund, benign Extremities: wnl Neuro: non purposeful movement to deep pain GU: nl  Resolved Hospital Problem list   N/A  Assessment & Plan:  1.  Reportedly Covid positive.  Clinically that fits with the presenting symptoms.  Will initiate Decadron and remdesivir.  2.  Status post asystolic arrest.  Etiology of this is unclear.  I think with Covid, possible hypercoagulable state, we will obtain a CT scan of the chest to rule out pulmonary embolus on the  way to the ICU.  3.  With the patient being post arrest that lasted approximately 15 minutes I think is not unreasonable to retain her here at [facility] and cool her to 36 degrees per  protocol.  4.  History of chronic polymyalgia rheumatica by chart: Patient takes 10 mg of prednisone daily for this.  It looks like the dose is varied between 5 and 10 mg.  Will use Decadron for Covid positive status which should cross cover her chronic steroid use.     Best practice:  Diet: N.p.o. Pain/Anxiety/Delirium protocol (if indicated):  Precedex as needed VAP protocol (if indicated): Levaquin DVT prophylaxis: SCDs GI prophylaxis: Pepcid Glucose control: CBG Mobility: Bedrest Code Status: Full Family Communication: discuss with husband Disposition: to ICU  Labs   CBC: Recent Labs  Lab 03/06/19 1742  WBC 6.5  NEUTROABS 4.4  HGB 11.7*  HCT 35.4*  MCV 104.7*  PLT 145*    Basic Metabolic Panel: Recent Labs  Lab 03-06-2019 1742  NA 134*  K 3.2*  CL 92*  CO2 27  GLUCOSE 111*  BUN 17  CREATININE 1.42*  CALCIUM 8.3*   GFR: CrCl cannot be calculated (Unknown ideal weight.). Recent Labs  Lab 03-06-19 1742 Mar 06, 2019 1929  WBC 6.5  --   LATICACIDVEN  --  1.2    Liver Function Tests: Recent Labs  Lab Mar 06, 2019 1742  AST 55*  ALT 35  ALKPHOS 48  BILITOT 0.9  PROT 7.2  ALBUMIN 3.5   No results for input(s): LIPASE, AMYLASE in the last 168 hours. No results for input(s): AMMONIA in the last 168 hours.  ABG    Component Value Date/Time   PHART 7.227 (L) 06-Mar-2019 2045   PCO2ART 54.4 (H) Mar 06, 2019 2045   PO2ART 87.9 03-06-19 2045   HCO3 21.4 Mar 06, 2019 2045   ACIDBASEDEF 5.7 (H) 03/06/19 2045   O2SAT 93.1 03-06-19 2045     Coagulation Profile: No results for input(s): INR, PROTIME in the last 168 hours.  Cardiac Enzymes: No results for input(s): CKTOTAL, CKMB, CKMBINDEX, TROPONINI in the last 168 hours.  HbA1C: No results found for: HGBA1C  CBG: Recent Labs  Lab 03/06/2019 2018  GLUCAP 115*    Review of Systems:   Unable to currently obtain  Past Medical History  She,  has a past medical history of Arthritis, Ascending aorta dilatation [facility]), Female bladder prolapse, Hypertension, Mitral regurgitation (12/14/2017), Morbid obesity (HCC), On home oxygen therapy, PAF (paroxysmal atrial fibrillation) (HCC) (paf), Peripheral neuropathy, Polymyalgia rheumatica (HCC), and Recurrent UTI (urinary tract infection).   Surgical History    Past Surgical History:  Procedure Laterality  Date  . ABDOMINAL HYSTERECTOMY    . CARDIOVERSION N/A 10/11/2017   Procedure: CARDIOVERSION;  Surgeon: Nahser, Deloris Ping, MD;  Location: [facility] ENDOSCOPY;  Service: Cardiovascular;  Laterality: N/A;  . CATARACT EXTRACTION W/ INTRAOCULAR LENS  IMPLANT, BILATERAL    . JOINT REPLACEMENT    . LAPAROSCOPIC CHOLECYSTECTOMY    . TEE WITHOUT CARDIOVERSION N/A 10/11/2017   Procedure: TRANSESOPHAGEAL ECHOCARDIOGRAM (TEE);  Surgeon: Elease Hashimoto Deloris Ping, MD;  Location: [facility] ENDOSCOPY;  Service: Cardiovascular;  Laterality: N/A;  . TOTAL KNEE ARTHROPLASTY Left 2008     Social History   reports that she quit smoking about 12 years ago. Her smoking use included cigarettes. She has a 60.00 pack-year smoking history. She has never used smokeless tobacco. She reports previous alcohol use. She reports that she does not use drugs.   Family History   Her family  history includes Alzheimer's disease in her mother; Heart disease in her brother.   Allergies Allergies  Allergen Reactions  . Fluoxetine Other (See Comments)    Altered mental status  . Adhesive [Tape] Rash    Cannot tolerate any tape or bandaids more than 24 hrs.  . Demerol [Meperidine Hcl] Rash  . Sulfa Antibiotics Rash     Home Medications  Prior to Admission medications   Medication Sig Start Date End Date Taking? Authorizing Provider  alendronate (FOSAMAX) 70 MG tablet Take 70 mg by mouth once a week. 03/28/18   [provider]  amiodarone (PACERONE) 200 MG tablet Take 1 tablet (200 mg total) by mouth daily. 06/06/18   Sueanne Margarita, MD  carvedilol (COREG) 25 MG tablet TAKE 1 TABLET(25 MG) BY MOUTH TWICE DAILY WITH A MEAL 11/05/18   Sueanne Margarita, MD  cholecalciferol (VITAMIN D) 1000 units tablet Take 2,000 Units by mouth daily.    [provider]  colestipol (COLESTID) 1 g tablet Take 1 g by mouth daily.  10/02/17   [provider]  ELIQUIS 5 MG TABS tablet TAKE 1 TABLET(5 MG) BY MOUTH TWICE DAILY 10/15/18   Sueanne Margarita, MD  ferrous sulfate 325 (65 FE) MG tablet Take 325 mg by mouth daily with breakfast.    [provider]  folic acid (FOLVITE) 1 MG tablet Take 1 mg by mouth daily. 09/19/17   [provider]  gabapentin (NEURONTIN) 300 MG capsule Take 300 mg by mouth at bedtime. 08/22/17   [provider]  HYDROcodone-acetaminophen (NORCO) 10-325 MG tablet Take 1 tablet by mouth every 6 (six) hours as needed for pain. 09/29/17   [provider]  losartan (COZAAR) 25 MG tablet TAKE 1 TABLET(25 MG) BY MOUTH DAILY 10/30/18   Sueanne Margarita, MD  methotrexate (RHEUMATREX) 2.5 MG tablet Take 15 mg by mouth once a week. Take on wednesdays 09/19/17   [provider]  potassium chloride (K-DUR) 10 MEQ tablet Take (3) 10 mg tablets by mouth in the morning and 2 (10) mg tablets by mouth in the evening for a total of 50 mg per day. 06/06/18   Sueanne Margarita, MD  predniSONE (DELTASONE) 5 MG tablet Take 10 mg by mouth daily with breakfast.  09/04/17   [provider]  torsemide (DEMADEX) 20 MG tablet TAKE 3 TABS (60 MG) BY MOUTH EVERY AM, TAKE 2 TABS (40 MG) EVERY PM 02/25/19   Sueanne Margarita, MD     Critical care time: 35 minutes was spent in bedside evaluation, chart review and critical planning

## 2019-02-26 NOTE — ED Triage Notes (Signed)
Pt arrived via EMS from home. Pt is c/o SOB and nausea. Pt tested positive for Covid-19 yesterday. Per EMS pt does have some SOB and makes grunting sounds. Pt o2 saturations were 98% in 12 lpm via NR/ Pt O2 saturations are 88-90% on RA.      BP 150/100, RR  26,    HR 78  Temp 98

## 2019-02-26 NOTE — ED Notes (Signed)
Ice packs placed axillary bilaterally and on groin

## 2019-02-26 NOTE — Progress Notes (Signed)
Pharmacy: Remdesivir   Patient is a 78 y.o. F with COVID.  Pharmacy has been consulted for remdesivir dosing.   - ALT: 35 - CXR shows: multifocal PNA likely from atypical organism - Pt is requiring supplemental oxygen: (100% FiO2 - on ventilator)    A/P:  - Patient meets criteria for remdesivir. Will initiate remdesivir 200 mg once followed by 100 mg daily x 4 days.  - Daily CMET while on remdesivir - Will f/u pt's ALT and clinical condition  Netta Cedars, PharmD, BCPS 02/17/2019@11 :15 PM

## 2019-02-26 NOTE — ED Provider Notes (Addendum)
[name] [name]   CSN: [id number] Arrival date & time: 02/24/2019  1610     History   Chief Complaint Chief Complaint  Patient presents with  . Shortness of Breath  . covid 19 Positive    HPI [name] [name] is a 78 y.o. female.     Patient complains of shortness of breath.  She states that she is positive with COVID that came back yesterday.  She also complains of cough and weakness  The history is provided by the patient.  Shortness of Breath Severity:  Moderate Onset quality:  Sudden Timing:  Constant Progression:  Worsening Chronicity:  New Context: activity   Relieved by:  Nothing Worsened by:  Nothing Associated symptoms: cough   Associated symptoms: no abdominal pain, no chest pain, no headaches and no rash     Past Medical History:  Diagnosis Date  . Arthritis    "inflammatory" (10/17/2017)  . Ascending aorta dilatation (HCC)    62m by echo 06/2018  . Female bladder prolapse   . Hypertension   . Mitral regurgitation 12/14/2017   Moderate to severe by TEE/DCCV 09-2017, moderate by echo 06/2018  . Morbid obesity ([place]   . On home oxygen therapy    "2L; 24/7" (10/17/2017)  . PAF (paroxysmal atrial fibrillation) (HCC) paf  . Peripheral neuropathy    "BLE" (10/17/2017)  . Polymyalgia rheumatica ([place]   . Recurrent UTI (urinary tract infection)     Patient Active Problem List   Diagnosis Date Noted  . Ascending aorta dilatation (HCC)   . Polymyalgia rheumatica ([place]   . Recurrent UTI (urinary tract infection)   . Mitral regurgitation 12/14/2017  . Polymyalgia ([place] 10/17/2017  . Chronic systolic CHF (congestive heart failure) ([place] 10/17/2017  . Chronic combined systolic and diastolic heart failure ([facility]   . DCM (dilated cardiomyopathy) ([place]   . PAF (paroxysmal atrial fibrillation) ([facility] 10/07/2017  . HTN (hypertension) 10/07/2017  . Neuropathy 10/07/2017  . IFG (impaired fasting glucose) 03/31/2017  .  Peripheral edema 01/11/2017  . Morbid obesity ([place] 10/14/2016  . Age-related osteoporosis without current pathological fracture 09/07/2016  . Osteopenia 05/06/2016  . Left hip pain 11/09/2015  . Drug therapy 07/18/2015  . Inflammatory arthritis 07/18/2015    Past Surgical History:  Procedure Laterality Date  . ABDOMINAL HYSTERECTOMY    . CARDIOVERSION N/A 10/11/2017   Procedure: CARDIOVERSION;  Surgeon: Nahser, PWonda Cheng MD;  [facility]  Service: Cardiovascular;  Laterality: N/A;  . CATARACT EXTRACTION W/ INTRAOCULAR LENS  IMPLANT, BILATERAL    . JOINT REPLACEMENT    . LAPAROSCOPIC CHOLECYSTECTOMY    . TEE WITHOUT CARDIOVERSION N/A 10/11/2017   Procedure: TRANSESOPHAGEAL ECHOCARDIOGRAM (TEE);  Surgeon: NAcie FredricksonPWonda Cheng MD;  [facility];  Service: Cardiovascular;  Laterality: N/A;  . TOTAL KNEE ARTHROPLASTY Left 2008     OB History   No obstetric history on file.      Home Medications    Prior to Admission medications   Medication Sig Start Date End Date Taking? Authorizing Provider  alendronate (FOSAMAX) 70 MG tablet Take 70 mg by mouth once a week. 03/28/18   [provider]  amiodarone (PACERONE) 200 MG tablet Take 1 tablet (200 mg total) by mouth daily. 06/06/18   TSueanne Margarita MD  carvedilol (COREG) 25 MG tablet TAKE 1 TABLET(25 MG) BY MOUTH TWICE DAILY WITH A MEAL 11/05/18   TSueanne Margarita MD  cholecalciferol (VITAMIN D) 1000 units tablet Take 2,000  Units by mouth daily.    [provider]  colestipol (COLESTID) 1 g tablet Take 1 g by mouth daily.  10/02/17   [provider]  ELIQUIS 5 MG TABS tablet TAKE 1 TABLET(5 MG) BY MOUTH TWICE DAILY 10/15/18   Sueanne Margarita, MD  ferrous sulfate 325 (65 FE) MG tablet Take 325 mg by mouth daily with breakfast.    [provider]  folic acid (FOLVITE) 1 MG tablet Take 1 mg by mouth daily. 09/19/17   [provider]  gabapentin (NEURONTIN) 300 MG capsule Take 300 mg  by mouth at bedtime. 08/22/17   [provider]  HYDROcodone-acetaminophen (NORCO) 10-325 MG tablet Take 1 tablet by mouth every 6 (six) hours as needed for pain. 09/29/17   [provider]  losartan (COZAAR) 25 MG tablet TAKE 1 TABLET(25 MG) BY MOUTH DAILY 10/30/18   Sueanne Margarita, MD  methotrexate (RHEUMATREX) 2.5 MG tablet Take 15 mg by mouth once a week. Take on wednesdays 09/19/17   [provider]  potassium chloride (K-DUR) 10 MEQ tablet Take (3) 10 mg tablets by mouth in the morning and 2 (10) mg tablets by mouth in the evening for a total of 50 mg per day. 06/06/18   Sueanne Margarita, MD  predniSONE (DELTASONE) 5 MG tablet Take 10 mg by mouth daily with breakfast.  09/04/17   [provider]  torsemide (DEMADEX) 20 MG tablet TAKE 3 TABS (60 MG) BY MOUTH EVERY AM, TAKE 2 TABS (40 MG) EVERY PM 02/25/19   Sueanne Margarita, MD    Family History Family History  Problem Relation Age of Onset  . Alzheimer's disease Mother   . Heart disease Brother     Social History Social History   Tobacco Use  . Smoking status: Former Smoker    Packs/day: 2.00    Years: 30.00    Pack years: 60.00    Types: Cigarettes    Quit date: 10/25/2006    Years since quitting: 12.3  . Smokeless tobacco: Never Used  Substance Use Topics  . Alcohol use: Not Currently  . Drug use: Never     Allergies   Fluoxetine, Adhesive [tape], Demerol [meperidine hcl], and Sulfa antibiotics   Review of Systems Review of Systems  Constitutional: Negative for appetite change and fatigue.  HENT: Negative for congestion, ear discharge and sinus pressure.   Eyes: Negative for discharge.  Respiratory: Positive for cough and shortness of breath.   Cardiovascular: Negative for chest pain.  Gastrointestinal: Negative for abdominal pain and diarrhea.  Genitourinary: Negative for frequency and hematuria.  Musculoskeletal: Negative for back pain.  Skin: Negative for rash.  Neurological:  Negative for seizures and headaches.  Psychiatric/Behavioral: Negative for hallucinations.     Physical Exam Updated Vital Signs BP (!) 174/87   Pulse 84   Temp 100.3 F (37.9 C) (Oral)   Resp 17   SpO2 97%   Physical Exam Vitals signs and nursing [name] reviewed.  Constitutional:      Appearance: She is well-developed.  HENT:     Head: Normocephalic.     Nose: Nose normal.  Eyes:     General: No scleral icterus.    Conjunctiva/sclera: Conjunctivae normal.  Neck:     Musculoskeletal: Neck supple.     Thyroid: No thyromegaly.  Cardiovascular:     Rate and Rhythm: Normal rate and regular rhythm.     Heart sounds: No murmur. No friction rub. No gallop.  Pulmonary:     Breath sounds: No stridor. Wheezing present. No rales.  Chest:     Chest wall: No tenderness.  Abdominal:     General: There is no distension.     Tenderness: There is no abdominal tenderness. There is no rebound.  Musculoskeletal: Normal range of motion.  Lymphadenopathy:     Cervical: No cervical adenopathy.  Skin:    Findings: No erythema or rash.  Neurological:     Mental Status: She is alert and oriented to person, place, and time.     Motor: No abnormal muscle tone.     Coordination: Coordination normal.  Psychiatric:        Behavior: Behavior normal.      ED Treatments / Results  Labs (all labs ordered are listed, but only abnormal results are displayed) Labs Reviewed  CBC WITH DIFFERENTIAL/PLATELET - Abnormal; Notable for the following components:      Result Value   RBC 3.38 (*)    Hemoglobin 11.7 (*)    HCT 35.4 (*)    MCV 104.7 (*)    MCH 34.6 (*)    Platelets 145 (*)    All other components within normal limits  COMPREHENSIVE METABOLIC PANEL - Abnormal; Notable for the following components:   Sodium 134 (*)    Potassium 3.2 (*)    Chloride 92 (*)    Glucose, Bld 111 (*)    Creatinine, Ser 1.42 (*)    Calcium 8.3 (*)    AST 55 (*)    GFR calc non Af Amer 35 (*)    GFR calc  Af Amer 41 (*)    All other components within normal limits  LACTATE DEHYDROGENASE - Abnormal; Notable for the following components:   LDH 407 (*)    All other components within normal limits  URINALYSIS, ROUTINE W REFLEX MICROSCOPIC - Abnormal; Notable for the following components:   APPearance HAZY (*)    Hgb urine dipstick MODERATE (*)    Ketones, ur 5 (*)    Protein, ur 100 (*)    Leukocytes,Ua LARGE (*)    WBC, UA >50 (*)    Non Squamous Epithelial 0-5 (*)    All other components within normal limits  SARS CORONAVIRUS 2 (TAT 6-24 HRS)  URINE CULTURE  LACTIC ACID, PLASMA    EKG EKG Interpretation  Date/Time:  Tuesday February 26 2019 16:24:59 EDT Ventricular Rate:  79 PR Interval:    QRS Duration: 93 QT Interval:  450 QTC Calculation: 516 R Axis:   -13 Text Interpretation:  Sinus rhythm Anteroseptal infarct, age indeterminate Prolonged QT interval Confirmed by Milton Ferguson [phone]) on 02/23/2019 6:49:26 PM   Radiology Dg Chest Port 1 View  Result Date: 02/23/2019 CLINICAL DATA:  Shortness of breath and nausea.  COVID-19 positive EXAM: PORTABLE CHEST 1 VIEW COMPARISON:  October 17, 2017. FINDINGS: There is ill-defined patchy opacity in the right mid lung and right base regions. Subtle patchy opacity in the left perihilar region and left base regions also noted. Heart is upper normal in size with pulmonary vascularity normal. No adenopathy. There is aortic atherosclerosis. No bone lesions. IMPRESSION: Multifocal ill-defined airspace opacity, somewhat more notable on the right, likely multifocal pneumonia. Atypical organism pneumonia suspected. No adenopathy. Heart upper normal in size. Aortic Atherosclerosis (ICD10-I70.0). Electronically Signed   By: Lowella Grip III M.D.   On: 03/13/2019 17:34    Procedures Procedure Name: Intubation Date/Time: 02/14/2019 9:05 PM Performed by: Milton Ferguson, MD Pre-anesthesia Checklist: Patient  identified, Patient being monitored,  Emergency Drugs available, Timeout performed and Suction available Oxygen Delivery Method: Non-rebreather mask Preoxygenation: Pre-oxygenation with 100% oxygen Induction Type: Rapid sequence Ventilation: Mask ventilation without difficulty Laryngoscope Size: Mac Grade View: Grade III Tube type: Subglottic suction tube Tube size: 7.0 mm Number of attempts: 1 Placement Confirmation: ETT inserted through vocal cords under direct vision,  CO2 detector and Breath sounds checked- equal and bilateral Comments: Patient was intubated without difficulty      (including critical care time)  Medications Ordered in ED Medications  levofloxacin (LEVAQUIN) IVPB 500 mg (has no administration in time range)  sodium chloride 0.9 % bolus 500 mL (500 mLs Intravenous New Bag/Given 02/20/2019 1805)  albuterol (VENTOLIN HFA) 108 (90 Base) MCG/ACT inhaler 4 puff (4 puffs Inhalation Given 02/19/2019 1758)  dexamethasone (DECADRON) injection 10 mg (10 mg Intravenous Given 03/14/2019 1758)     Initial Impression / Assessment and Plan / ED Course  I have reviewed the triage vital signs and the nursing notes.  Pertinent labs & imaging results that were available during my care of the patient were reviewed by me and considered in my medical decision making (see chart for details).    CRITICAL CARE Performed by: Milton Ferguson Total critical care time:40 minutes Critical care time was exclusive of separately billable procedures and treating other patients. Critical care was necessary to treat or prevent imminent or life-threatening deterioration. Critical care was time spent personally by me on the following activities: development of treatment plan with patient and/or surrogate as well as nursing, discussions with consultants, evaluation of patient's response to treatment, examination of patient, obtaining history from patient or surrogate, ordering and performing treatments and interventions, ordering and review  of laboratory studies, ordering and review of radiographic studies, pulse oximetry and re-evaluation of patient's condition.     Zina Pitzer was evaluated in Emergency Department on 02/20/2019 for the symptoms described in the history of present illness. She was evaluated in the context of the global COVID-19 pandemic, which necessitated consideration that the patient might be at risk for infection with the SARS-CoV-2 virus that causes COVID-19. Institutional protocols and algorithms that pertain to the evaluation of patients at risk for COVID-19 are in a state of rapid change based on information released by regulatory bodies including the CDC and federal and state organizations. These policies and algorithms were followed during the patient's care in the ED. Patient with hypoxia and atypical pneumonia on x-ray.  Also possible urinary tract infection.  We are repeating her COVID test here but she will be admitted to medicine Patient arrested in the room and was initially seen she had no pulse and no blood pressure and no respiratory rate.  CPR was started immediately she was bagged with oxygen and then intubated.  Patient was given numerous epinephrines and eventually got a pulse and a blood pressure.  Critical care has been called and will continue care. Final Clinical Impressions(s) / ED Diagnoses   Final diagnoses:  Hypoxia    ED Discharge Orders    None       Milton Ferguson, MD 03/04/2019 Lanetta Inch    Milton Ferguson, MD 03/08/2019 2106

## 2019-02-27 ENCOUNTER — Inpatient Hospital Stay (HOSPITAL_COMMUNITY)
Admit: 2019-02-27 | Discharge: 2019-02-27 | Disposition: A | Discharge status: Home / Self Care | Payer: HMO | Attending: Specialist | Admitting: Specialist

## 2019-02-27 ENCOUNTER — Other Ambulatory Visit: Payer: Self-pay

## 2019-02-27 ENCOUNTER — Inpatient Hospital Stay (HOSPITAL_COMMUNITY): Payer: HMO

## 2019-02-27 ENCOUNTER — Inpatient Hospital Stay: Payer: Self-pay

## 2019-02-27 DIAGNOSIS — R092 Respiratory arrest: Secondary | ICD-10-CM

## 2019-02-27 DIAGNOSIS — I469 Cardiac arrest, cause unspecified: Secondary | ICD-10-CM

## 2019-02-27 DIAGNOSIS — U071 COVID-19: Secondary | ICD-10-CM | POA: Diagnosis not present

## 2019-02-27 DIAGNOSIS — J9621 Acute and chronic respiratory failure with hypoxia: Secondary | ICD-10-CM | POA: Diagnosis present

## 2019-02-27 DIAGNOSIS — J80 Acute respiratory distress syndrome: Secondary | ICD-10-CM | POA: Diagnosis not present

## 2019-02-27 DIAGNOSIS — I361 Nonrheumatic tricuspid (valve) insufficiency: Secondary | ICD-10-CM | POA: Diagnosis not present

## 2019-02-27 DIAGNOSIS — J9601 Acute respiratory failure with hypoxia: Secondary | ICD-10-CM | POA: Diagnosis not present

## 2019-02-27 LAB — BASIC METABOLIC PANEL
Anion gap: 14 (ref 5–15)
BUN: 21 mg/dL (ref 8–23)
CO2: 20 mmol/L — ABNORMAL LOW (ref 22–32)
Calcium: 7.6 mg/dL — ABNORMAL LOW (ref 8.9–10.3)
Chloride: 100 mmol/L (ref 98–111)
Creatinine, Ser: 1.43 mg/dL — ABNORMAL HIGH (ref 0.44–1.00)
GFR calc Af Amer: 40 mL/min — ABNORMAL LOW (ref >60)
GFR calc non Af Amer: 35 mL/min — ABNORMAL LOW (ref >60)
Glucose, Bld: 149 mg/dL — ABNORMAL HIGH (ref 70–99)
Potassium: 5.4 mmol/L — ABNORMAL HIGH (ref 3.5–5.1)
Sodium: 134 mmol/L — ABNORMAL LOW (ref 135–145)

## 2019-02-27 LAB — POCT I-STAT 7, (LYTES, BLD GAS, ICA,H+H)
Acid-base deficit: 2 mmol/L (ref 0.0–2.0)
Bicarbonate: 22.6 mmol/L (ref 20.0–28.0)
Calcium, Ion: 1.05 mmol/L — ABNORMAL LOW (ref 1.15–1.40)
HCT: 28 % — ABNORMAL LOW (ref 36.0–46.0)
Hemoglobin: 9.5 g/dL — ABNORMAL LOW (ref 12.0–15.0)
O2 Saturation: 90 %
Patient temperature: 36.1
Potassium: 3.1 mmol/L — ABNORMAL LOW (ref 3.5–5.1)
Sodium: 136 mmol/L (ref 135–145)
TCO2: 24 mmol/L (ref 22–32)
pCO2 arterial: 37.3 mmHg (ref 32.0–48.0)
pH, Arterial: 7.386 (ref 7.350–7.450)
pO2, Arterial: 57 mmHg — ABNORMAL LOW (ref 83.0–108.0)

## 2019-02-27 LAB — BLOOD GAS, ARTERIAL
Acid-Base Excess: 0.4 mmol/L (ref 0.0–2.0)
Acid-base deficit: 0.1 mmol/L (ref 0.0–2.0)
Bicarbonate: 24.8 mmol/L (ref 20.0–28.0)
Bicarbonate: 25.1 mmol/L (ref 20.0–28.0)
FIO2: 100
FIO2: 100
O2 Saturation: 96.2 %
O2 Saturation: 97.4 %
Patient temperature: 101.3
Patient temperature: 98.6
pCO2 arterial: 44.3 mmHg (ref 32.0–48.0)
pCO2 arterial: 45.4 mmHg (ref 32.0–48.0)
pH, Arterial: 7.367 (ref 7.350–7.450)
pH, Arterial: 7.369 (ref 7.350–7.450)
pO2, Arterial: 102 mmHg (ref 83.0–108.0)
pO2, Arterial: 108 mmHg (ref 83.0–108.0)

## 2019-02-27 LAB — COMPREHENSIVE METABOLIC PANEL
ALT: 75 U/L — ABNORMAL HIGH (ref 0–44)
AST: 182 U/L — ABNORMAL HIGH (ref 15–41)
Albumin: 2.9 g/dL — ABNORMAL LOW (ref 3.5–5.0)
Alkaline Phosphatase: 52 U/L (ref 38–126)
Anion gap: 15 (ref 5–15)
BUN: 18 mg/dL (ref 8–23)
CO2: 22 mmol/L (ref 22–32)
Calcium: 7 mg/dL — ABNORMAL LOW (ref 8.9–10.3)
Chloride: 97 mmol/L — ABNORMAL LOW (ref 98–111)
Creatinine, Ser: 1.28 mg/dL — ABNORMAL HIGH (ref 0.44–1.00)
GFR calc Af Amer: 46 mL/min — ABNORMAL LOW (ref >60)
GFR calc non Af Amer: 40 mL/min — ABNORMAL LOW (ref >60)
Glucose, Bld: 218 mg/dL — ABNORMAL HIGH (ref 70–99)
Potassium: 3.2 mmol/L — ABNORMAL LOW (ref 3.5–5.1)
Sodium: 134 mmol/L — ABNORMAL LOW (ref 135–145)
Total Bilirubin: 1.4 mg/dL — ABNORMAL HIGH (ref 0.3–1.2)
Total Protein: 6.2 g/dL — ABNORMAL LOW (ref 6.5–8.1)

## 2019-02-27 LAB — CBC
HCT: 34.5 % — ABNORMAL LOW (ref 36.0–46.0)
HCT: 33.2 % — ABNORMAL LOW (ref 36.0–46.0)
Hemoglobin: 11 g/dL — ABNORMAL LOW (ref 12.0–15.0)
Hemoglobin: 10.5 g/dL — ABNORMAL LOW (ref 12.0–15.0)
MCH: 34.5 pg — ABNORMAL HIGH (ref 26.0–34.0)
MCH: 34 pg (ref 26.0–34.0)
MCHC: 31.9 g/dL (ref 30.0–36.0)
MCHC: 31.6 g/dL (ref 30.0–36.0)
MCV: 108.2 fL — ABNORMAL HIGH (ref 80.0–100.0)
MCV: 107.4 fL — ABNORMAL HIGH (ref 80.0–100.0)
Platelets: 137 10*3/uL — ABNORMAL LOW (ref 150–400)
Platelets: 132 10*3/uL — ABNORMAL LOW (ref 150–400)
RBC: 3.19 MIL/uL — ABNORMAL LOW (ref 3.87–5.11)
RBC: 3.09 MIL/uL — ABNORMAL LOW (ref 3.87–5.11)
RDW: 14.9 % (ref 11.5–15.5)
RDW: 14.8 % (ref 11.5–15.5)
WBC: 8.9 10*3/uL (ref 4.0–10.5)
WBC: 8.3 10*3/uL (ref 4.0–10.5)
nRBC: 0 % (ref 0.0–0.2)
nRBC: 0 % (ref 0.0–0.2)

## 2019-02-27 LAB — GLUCOSE, CAPILLARY
Glucose-Capillary: 135 mg/dL — ABNORMAL HIGH (ref 70–99)
Glucose-Capillary: 136 mg/dL — ABNORMAL HIGH (ref 70–99)
Glucose-Capillary: 137 mg/dL — ABNORMAL HIGH (ref 70–99)
Glucose-Capillary: 140 mg/dL — ABNORMAL HIGH (ref 70–99)
Glucose-Capillary: 151 mg/dL — ABNORMAL HIGH (ref 70–99)
Glucose-Capillary: 159 mg/dL — ABNORMAL HIGH (ref 70–99)
Glucose-Capillary: 206 mg/dL — ABNORMAL HIGH (ref 70–99)

## 2019-02-27 LAB — TRIGLYCERIDES: Triglycerides: 174 mg/dL — ABNORMAL HIGH (ref <150)

## 2019-02-27 LAB — HEPARIN LEVEL (UNFRACTIONATED): Heparin Unfractionated: 2.08 IU/mL — ABNORMAL HIGH (ref 0.30–0.70)

## 2019-02-27 LAB — SARS CORONAVIRUS 2 (TAT 6-24 HRS): SARS Coronavirus 2: POSITIVE — AB

## 2019-02-27 LAB — PROCALCITONIN: Procalcitonin: 44.96 ng/mL

## 2019-02-27 LAB — APTT: aPTT: 78 seconds — ABNORMAL HIGH (ref 24–36)

## 2019-02-27 LAB — FERRITIN: Ferritin: 3928 ng/mL — ABNORMAL HIGH (ref 11–307)

## 2019-02-27 LAB — HEMOGLOBIN A1C
Hgb A1c MFr Bld: 6 % — ABNORMAL HIGH (ref 4.8–5.6)
Mean Plasma Glucose: 125.5 mg/dL

## 2019-02-27 LAB — C-REACTIVE PROTEIN: CRP: 15.2 mg/dL — ABNORMAL HIGH (ref <1.0)

## 2019-02-27 LAB — MRSA PCR SCREENING: MRSA by PCR: NEGATIVE

## 2019-02-27 LAB — MAGNESIUM: Magnesium: 1.7 mg/dL (ref 1.7–2.4)

## 2019-02-27 MED ORDER — ASPIRIN 325 MG PO TABS
325.0000 mg | ORAL_TABLET | Freq: Every day | ORAL | Status: DC
  Administered 2019-02-28: 325 mg via ORAL
  Filled 2019-02-27 (×2): qty 1

## 2019-02-27 MED ORDER — HEPARIN (PORCINE) 25000 UT/250ML-% IV SOLN
1100.0000 [IU]/h | INTRAVENOUS | Status: DC
  Administered 2019-02-27 – 2019-02-28 (×2): 1100 [IU]/h via INTRAVENOUS
  Filled 2019-02-27 (×2): qty 250

## 2019-02-27 MED ORDER — PERFLUTREN LIPID MICROSPHERE
1.0000 mL | INTRAVENOUS | Status: AC | PRN
  Filled 2019-02-27: qty 10

## 2019-02-27 MED ORDER — INSULIN ASPART 100 UNIT/ML ~~LOC~~ SOLN: 0.0000 [IU] | SUBCUTANEOUS | Status: DC

## 2019-02-27 MED ORDER — VANCOMYCIN HCL 10 G IV SOLR
2500.0000 mg | Freq: Once | INTRAVENOUS | Status: AC
  Administered 2019-02-27: 2500 mg via INTRAVENOUS
  Filled 2019-02-27: qty 2000

## 2019-02-27 MED ORDER — SODIUM CHLORIDE 0.9% FLUSH
10.0000 mL | Freq: Two times a day (BID) | INTRAVENOUS | Status: DC
  Administered 2019-02-27 – 2019-03-01 (×5): 10 mL
  Administered 2019-03-02: 20 mL
  Administered 2019-03-02 – 2019-03-07 (×9): 10 mL

## 2019-02-27 MED ORDER — PERFLUTREN LIPID MICROSPHERE
1.0000 mL | INTRAVENOUS | Status: DC | PRN
  Administered 2019-02-27: 16:00:00 3 mL via INTRAVENOUS

## 2019-02-27 MED ORDER — EPINEPHRINE 1 MG/10ML IJ SOSY
PREFILLED_SYRINGE | INTRAMUSCULAR | Status: AC | PRN
  Administered 2019-02-26: 1 via INTRAVENOUS
  Administered 2019-02-26: 1 mg via INTRAVENOUS
  Administered 2019-02-26: 1 via INTRAVENOUS

## 2019-02-27 MED ORDER — MUPIROCIN 2 % EX OINT
1.0000 "application " | TOPICAL_OINTMENT | Freq: Two times a day (BID) | CUTANEOUS | Status: DC
  Administered 2019-02-27 – 2019-02-28 (×5): 1 via NASAL
  Filled 2019-02-27 (×2): qty 22

## 2019-02-27 MED ORDER — LEVOFLOXACIN IN D5W 500 MG/100ML IV SOLN: 500.0000 mg | INTRAVENOUS | Status: DC

## 2019-02-27 MED ORDER — ORAL CARE MOUTH RINSE
15.0000 mL | OROMUCOSAL | Status: DC
  Administered 2019-02-27 – 2019-03-07 (×81): 15 mL via OROMUCOSAL

## 2019-02-27 MED ORDER — ROCURONIUM BROMIDE 10 MG/ML (PF) SYRINGE
100.0000 mg | PREFILLED_SYRINGE | Freq: Once | INTRAVENOUS | Status: AC
  Administered 2019-02-27: 100 mg via INTRAVENOUS
  Filled 2019-02-27: qty 10

## 2019-02-27 MED ORDER — LEVOFLOXACIN IN D5W 750 MG/150ML IV SOLN
750.0000 mg | INTRAVENOUS | Status: DC
  Administered 2019-02-28: 750 mg via INTRAVENOUS
  Filled 2019-02-27: qty 150

## 2019-02-27 MED ORDER — PROPOFOL 1000 MG/100ML IV EMUL
INTRAVENOUS | Status: AC
  Administered 2019-02-27: 13:00:00
  Filled 2019-02-27: qty 100

## 2019-02-27 MED ORDER — PROPOFOL 1000 MG/100ML IV EMUL
5.0000 ug/kg/min | INTRAVENOUS | Status: DC
  Administered 2019-02-27: 02:00:00 5 ug/kg/min via INTRAVENOUS

## 2019-02-27 MED ORDER — SODIUM CHLORIDE 0.9% FLUSH: 10.0000 mL | INTRAVENOUS | Status: DC | PRN

## 2019-02-27 MED ORDER — CHLORHEXIDINE GLUCONATE CLOTH 2 % EX PADS
6.0000 | MEDICATED_PAD | Freq: Every day | CUTANEOUS | Status: AC
  Administered 2019-02-27 – 2019-03-03 (×4): 6 via TOPICAL

## 2019-02-27 MED ORDER — VANCOMYCIN HCL 10 G IV SOLR
1500.0000 mg | INTRAVENOUS | Status: DC
  Filled 2019-02-27: qty 1500

## 2019-02-27 MED ORDER — SODIUM CHLORIDE 0.9 % IV BOLUS
500.0000 mL | Freq: Once | INTRAVENOUS | Status: AC
  Administered 2019-02-27: 500 mL via INTRAVENOUS

## 2019-02-27 MED ORDER — INSULIN ASPART 100 UNIT/ML ~~LOC~~ SOLN
0.0000 [IU] | SUBCUTANEOUS | Status: DC
  Administered 2019-02-27: 1 [IU] via SUBCUTANEOUS
  Administered 2019-02-27: 19:00:00 2 [IU] via SUBCUTANEOUS
  Administered 2019-02-27: 1 [IU] via SUBCUTANEOUS
  Administered 2019-02-28: 2 [IU] via SUBCUTANEOUS
  Administered 2019-02-28: 3 [IU] via SUBCUTANEOUS
  Administered 2019-02-28 (×2): 2 [IU] via SUBCUTANEOUS
  Administered 2019-02-28: 1 [IU] via SUBCUTANEOUS
  Administered 2019-03-01: 3 [IU] via SUBCUTANEOUS
  Administered 2019-03-01: 2 [IU] via SUBCUTANEOUS
  Administered 2019-03-01 (×4): 3 [IU] via SUBCUTANEOUS
  Administered 2019-03-02 (×2): 2 [IU] via SUBCUTANEOUS
  Administered 2019-03-02: 17:00:00 3 [IU] via SUBCUTANEOUS
  Administered 2019-03-02: 2 [IU] via SUBCUTANEOUS
  Administered 2019-03-02: 5 [IU] via SUBCUTANEOUS
  Administered 2019-03-02 – 2019-03-03 (×3): 3 [IU] via SUBCUTANEOUS

## 2019-02-27 MED ORDER — FENTANYL 2500MCG IN NS 250ML (10MCG/ML) PREMIX INFUSION
0.0000 ug/h | INTRAVENOUS | Status: DC
  Administered 2019-02-27: 25 ug/h via INTRAVENOUS
  Administered 2019-02-27: 100 ug/h via INTRAVENOUS
  Filled 2019-02-27 (×2): qty 250

## 2019-02-27 MED ORDER — CHLORHEXIDINE GLUCONATE CLOTH 2 % EX PADS
6.0000 | MEDICATED_PAD | Freq: Every day | CUTANEOUS | Status: DC
  Administered 2019-02-28 – 2019-03-07 (×8): 6 via TOPICAL

## 2019-02-27 MED ORDER — PROPOFOL 1000 MG/100ML IV EMUL
5.0000 ug/kg/min | INTRAVENOUS | Status: DC
  Administered 2019-02-27: 5 ug/kg/min via INTRAVENOUS
  Administered 2019-02-27: 25 ug/kg/min via INTRAVENOUS
  Filled 2019-02-27 (×2): qty 100

## 2019-02-27 MED ORDER — CHLORHEXIDINE GLUCONATE 0.12% ORAL RINSE (MEDLINE KIT)
15.0000 mL | Freq: Two times a day (BID) | OROMUCOSAL | Status: DC
  Administered 2019-02-27 (×2): 15 mL via OROMUCOSAL

## 2019-02-27 MED ORDER — MIDAZOLAM 50MG/50ML (1MG/ML) PREMIX INFUSION
1.0000 mg/h | INTRAVENOUS | Status: DC
  Administered 2019-02-27 – 2019-02-28 (×2): 0.5 mg/h via INTRAVENOUS
  Filled 2019-02-27 (×2): qty 50

## 2019-02-27 MED FILL — Medication: Qty: 1 | Status: AC

## 2019-02-27 NOTE — Progress Notes (Signed)
ABG results called to Dr. Lake Bells. No new orders received at this time. RT will continue to monitor.

## 2019-02-27 NOTE — Progress Notes (Signed)
Per Dr Elsworth Soho at bedside, sedation decreased to do wakeup assessment. Fentanyl decreased to 26mcg

## 2019-02-27 NOTE — Progress Notes (Signed)
eLink Physician-Brief Progress Note Patient Name: [name] [name] DOB: 1940/10/25 MRN: [id number]   Date of Service  02/27/2019  HPI/Events of Note  Patient seen in ED for Covid pneumonia.  She developed cardiac arrest unexpectedly and received ACLS protocol briefly with prompt ROSC. Cardiac enzymes are elevated.  eICU Interventions  Heparinization + Aspirin, standard care for Covid pneumonia, cardiology consult for ACS vs Type 2 ischemia secondary to Covid.        [name] [name] 02/27/2019, 1:22 AM

## 2019-02-27 NOTE — Code Documentation (Signed)
Left Femoral pulse palpated; strong regular.

## 2019-02-27 NOTE — Progress Notes (Signed)
eLink Physician-Brief Progress Note Patient Name: Milica Gully DOB: [birth year] MRN: [id number]   Date of Service  02/27/2019  HPI/Events of Note  Sats 86-88 % on 100 % FIO2 on the ventilator. PEEP 10  eICU Interventions  RT given order to increase PEEP to 13 and target a saturation > 92 %.        Kerry Kass Aryiana Klinkner 02/27/2019, 3:07 AM

## 2019-02-27 NOTE — Progress Notes (Signed)
eLink Physician-Brief Progress Note Patient Name: [name] [name] DOB: Jun 05, 1940 MRN: [id number]   Date of Service  02/27/2019  HPI/Events of Note  Pt needs POC blood sugar testing with Novalog Insulin sliding scale coverage.  eICU Interventions  Orders entered.        Kerry Kass Ogan 02/27/2019, 6:28 AM

## 2019-02-27 NOTE — Progress Notes (Signed)
Peripherally Inserted Central Catheter/Midline Placement  The IV Nurse has discussed with the patient and/or persons authorized to consent for the patient, the purpose of this procedure and the potential benefits and risks involved with this procedure.  The benefits include less needle sticks, lab draws from the catheter, and the patient may be discharged home with the catheter. Risks include, but not limited to, infection, bleeding, blood clot (thrombus formation), and puncture of an artery; nerve damage and irregular heartbeat and possibility to perform a PICC exchange if needed/ordered by physician.  Alternatives to this procedure were also discussed.  Bard Power PICC patient education guide, fact sheet on infection prevention and patient information card has been provided to patient /or left at bedside.    PICC/Midline Placement Documentation  PICC Triple Lumen 56/86/16 PICC Right Basilic 41 cm 0 cm (Active)  Indication for Insertion or Continuance of Line Prolonged intravenous therapies 02/27/19 1359  Exposed Catheter (cm) 0 cm 02/27/19 1359  Site Assessment Clean;Dry;Intact 02/27/19 1359  Lumen #1 Status Flushed;Blood return noted 02/27/19 1359  Lumen #2 Status Flushed;Blood return noted 02/27/19 1359  Lumen #3 Status Flushed;Blood return noted 02/27/19 1359  Dressing Type Transparent 02/27/19 1359  Dressing Status Clean;Dry;Intact;Antimicrobial disc in place;Other (Comment) 02/27/19 1359  Dressing Intervention New dressing 02/27/19 8372  Dressing Change Due 03/06/19 02/27/19 1359   Telephone consent signed by husband   [name] [name] 02/27/2019, 2:00 PM

## 2019-02-27 NOTE — Progress Notes (Signed)
PROGRESS NOTE    [name] [name]  PTW:[id number] DOB: 03-04-1941 DOA: 02/24/2019 PCP: Charletta Cousin., MD   Brief Narrative:  78 year old WF PMHx ascending aorta dilation, chronic systolic and diastolic CHF, dilated cardiomyopathy, essential HTN, paroxysmal atrial fibrillation, mitral valve regurgitation moderate, polymyalgia rheumatica, chronic respiratory failure with hypoxia on 2 L O2 via [place] at home.  diagnosed as being Covid positive 10/12 presented with increased cough shortness of breath & hypoxia requiring 12L O2 , while being initially evaluated in the emergency room patient had a pulseless arrest.  Rhythm was asystole . Patient responded 3 doses of epinephrine during CPR with return of rhythm and blood pressure   Subjective: 10/14 sedated/intubated.  Vent dyssynchrony   Assessment & Plan:   Active Problems:   PAF (paroxysmal atrial fibrillation) (HCC)   HTN (hypertension)   DCM (dilated cardiomyopathy) (HCC)   IFG (impaired fasting glucose)   Morbid obesity (HCC)   Chronic combined systolic and diastolic heart failure (HCC)   Mitral regurgitation   Polymyalgia rheumatica (HCC)   Ascending aorta dilatation (HCC)   Respiratory arrest (HCC)   Acute on chronic respiratory failure with hypoxia ([place])   Pneumonia due to COVID-19 virus  Patient evaluated by PCCM today plan below.  Covid pneumonia/acute on chronic respiratory failure with hypoxia   ARDS - COVID versus aspiration - Decadron and remdesivir. -Lung protective ventilation with tidal volume 8 cc/kg, lowered to 6 cc/kg -PF ratio is less than 150, but will not prone today given abdominal distention -Severe vent asynchrony is present and will consider paralytic once we have a good neuro check off sedation\ -Vent management per PCCM -Prone patient when stable on vent for 16 hours/day  Aspiration pneumonia- -Multiple admission chest x-ray arrest episodes -Tracheal aspirate pending  -Continue empiric  antibiotics; Levaquin and vancomycin meantime   Flail chest -Most likely secondary to CPR when patient coded. -We will make weaning difficult -We will also require significant pain control/paralytics to eliminate vent dyssynchrony  Status post asystolic arrest.   -Etiology of this is unclear.  May have been related to extreme hypoxia -10/14 echocardiogram pending, does have moderate MR   At risk anoxic encephalopathy -continue propofol and fentanyl for sedation with goal RA SS -1 to -2 and vent synchrony Would like neuro check off sedation to prognosticate Check EEG for completion Arctic sun pads not available , will aim for temperature 36-37 & avoid  fevers  use Tylenol around-the-clock if needed  Abdominal distention -KUB pending -OG tube to low intermittent suction  If distention improves then can consider proning Hold tube feeds  History of paroxysmal atrial fibrillation/moderate MR-was on Eliquis Use IV heparin    History of chronic polymyalgia rheumatica by chart: Patient takes 5- 10 mg of prednisone daily for this. Will use Decadron for Covid positive status which should cross cover her chronic steroid use.   Summary-appears to be COVID pneumonia with superimposed aspiration and now flail chest p- Cardiac arrest.  Would like a good neuro exam to rule out anoxic encephalopathy. Unfortunately  ARDS protocol is limited by abdominal distention and vent asynchrony    DVT prophylaxis: Heparin Code Status: Full Family Communication: None Disposition Plan: TBD   Consultants:  PCCM   Procedures/Significant Events:  Head CT 10/13 >> neg CT angio chest 10/13 >> neg PE, consolidation bilateral, with patchy infiltrates,  mildly displaced anterior rib fractures: Bilateral ribs 2, 3, 4, 5 (up to moderately displaced on series 11, image 75), 6, 7, and 8. Additional  lateral 6th 7th and 8th right rib fractures (flail segment) with similar displacement   I have  personally reviewed and interpreted all radiology studies and my findings are as above.  VENTILATOR SETTINGS: Vent mode; PRVC Vt Set; 430 FiO2; 60% PEEP; 16    Cultures 10/13 blood pending 10/13 urine pending    Antimicrobials: Anti-infectives (From admission, onward)   Start     Stop   02/28/19 2200  vancomycin (VANCOCIN) 1,500 mg in sodium chloride 0.9 % 500 mL IVPB         02/28/19 1000  levofloxacin (LEVAQUIN) IVPB 750 mg         02/27/19 2200  remdesivir 100 mg in sodium chloride 0.9 % 250 mL IVPB     03/03/19 2159   02/27/19 1000  vancomycin (VANCOCIN) 2,500 mg in sodium chloride 0.9 % 500 mL IVPB     02/27/19 1416   02/27/19 0930  levofloxacin (LEVAQUIN) IVPB 500 mg  Status:  Discontinued     02/27/19 0932   02/27/19 0000  remdesivir 200 mg in sodium chloride 0.9 % 250 mL IVPB     02/27/19 0039   03/15/2019 1915  levofloxacin (LEVAQUIN) IVPB 500 mg     02/16/2019 2138   03/13/2019 1900  cefTRIAXone (ROCEPHIN) 1 g in sodium chloride 0.9 % 100 mL IVPB  Status:  Discontinued     02/25/2019 1902       Devices   LINES / TUBES:      Continuous Infusions:  sodium chloride Stopped (02/27/19 1133)   famotidine (PEPCID) IV Stopped (02/19/2019 2328)   fentaNYL infusion INTRAVENOUS 100 mcg/hr (02/27/19 1600)   heparin 1,100 Units/hr (02/27/19 1600)   [START ON 02/28/2019] levofloxacin (LEVAQUIN) IV     midazolam 0.5 mg/hr (02/27/19 1908)   norepinephrine (LEVOPHED) Adult infusion Stopped (02/27/19 1610)   remdesivir 100 mg in NS 250 mL     [START ON 02/28/2019] vancomycin       Objective: Vitals:   02/27/19 1523 02/27/19 1600 02/27/19 1730 02/27/19 1800  BP:  (!) 167/103  (!) 82/58  Pulse:  66 61 (!) 57  Resp:  12 (!) 32 (!) 23  Temp:  (!) 96.6 F (35.9 C)  (!) 97 F (36.1 C)  TempSrc:  Bladder    SpO2: 96% 100% 97% 99%  Weight:      Height:        Intake/Output Summary (Last 24 hours) at 02/27/2019 1915 Last data filed at 02/27/2019 1822 Gross  per 24 hour  Intake 2102.02 ml  Output 1450 ml  Net 652.02 ml   Filed Weights   02/27/19 0134  Weight: 134.4 kg    Examination:  General: Sedated/ventilated positive acute on chronic respiratory distress Eyes: negative scleral hemorrhage, negative anisocoria, negative icterus ENT: Negative Runny nose, positive gingival bleeding, positive bleeding laceration tongue, Neck:  Negative scars, masses, torticollis, lymphadenopathy, JVD Lungs: Absent breath sounds LLL, decreased breath sounds remainder lung fields, Cardiovascular: Tachycardic, without murmur gallop or rub normal S1 and S2 Abdomen: MORBIDLY OBESE, negative abdominal pain, positive distention , negative soft, bowel sounds, no rebound, no ascites, no appreciable mass Extremities: No significant cyanosis, clubbing, or edema bilateral lower extremities Skin: Negative rashes, lesions, ulcers Psychiatric: Sedated/intubated  Central nervous system: Sedated/intubated  .     Data Reviewed: Care during the described time interval was provided by me .  I have reviewed this patient's available data, including medical history, events of note, physical examination, and all test results as  part of my evaluation.   CBC: Recent Labs  Lab 02/25/2019 1742 03/15/2019 2215 02/27/19 0152 02/27/19 0243 02/27/19 1825  WBC 6.5 10.7* 8.9 8.3  --   NEUTROABS 4.4  --   --   --   --   HGB 11.7* 11.1* 11.0* 10.5* 9.5*  HCT 35.4* 34.6* 34.5* 33.2* 28.0*  MCV 104.7* 108.5* 108.2* 107.4*  --   PLT 145* 155 137* 132*  --    Basic Metabolic Panel: Recent Labs  Lab 03/10/2019 1742 03/14/2019 2237 02/27/19 0142 02/27/19 0152 02/27/19 1056 02/27/19 1825  NA 134* 133*  --  134* 134* 136  K 3.2* 3.8  --  3.2* 5.4* 3.1*  CL 92* 98  --  97* 100  --   CO2 27 21*  --  22 20*  --   GLUCOSE 111* 214*  --  218* 149*  --   BUN 17 18  --  18 21  --   CREATININE 1.42* 1.44*  --  1.28* 1.43*  --   CALCIUM 8.3* 7.3*  --  7.0* 7.6*  --   MG  --   --  1.7   --   --   --    GFR: Estimated Creatinine Clearance: 43.6 mL/min (A) (by C-G formula based on SCr of 1.43 mg/dL (H)). Liver Function Tests: Recent Labs  Lab 03/06/2019 1742 02/27/19 0152  AST 55* 182*  ALT 35 75*  ALKPHOS 48 52  BILITOT 0.9 1.4*  PROT 7.2 6.2*  ALBUMIN 3.5 2.9*   No results for input(s): LIPASE, AMYLASE in the last 168 hours. No results for input(s): AMMONIA in the last 168 hours. Coagulation Profile: Recent Labs  Lab 03/03/2019 2215  INR 1.3*   Cardiac Enzymes: No results for input(s): CKTOTAL, CKMB, CKMBINDEX, TROPONINI in the last 168 hours. BNP (last 3 results) No results for input(s): PROBNP in the last 8760 hours. HbA1C: Recent Labs    02/27/19 0243  HGBA1C 6.0*   CBG: Recent Labs  Lab 02/27/2019 2159 02/27/19 0606 02/27/19 0851 02/27/19 1129 02/27/19 1809  GLUCAP 206* 159* 136* 137* 151*   Lipid Profile: Recent Labs    02/27/19 0152  TRIG 174*   Thyroid Function Tests: No results for input(s): TSH, T4TOTAL, FREET4, T3FREE, THYROIDAB in the last 72 hours. Anemia Panel: Recent Labs    02/16/2019 2217  FERRITIN 3,928*   Urine analysis:    Component Value Date/Time   COLORURINE YELLOW 02/16/2019 1752   APPEARANCEUR HAZY (A) 02/17/2019 1752   LABSPEC 1.013 02/22/2019 1752   PHURINE 6.0 02/25/2019 1752   GLUCOSEU NEGATIVE 02/24/2019 1752   HGBUR MODERATE (A) 03/01/2019 1752   BILIRUBINUR NEGATIVE 03/13/2019 1752   KETONESUR 5 (A) 03/13/2019 1752   PROTEINUR 100 (A) 03/15/2019 1752   UROBILINOGEN 0.2 10/27/2006 1337   NITRITE NEGATIVE 03/14/2019 1752   LEUKOCYTESUR LARGE (A) 02/24/2019 1752   Sepsis Labs: _0 (procalcitonin:4,lacticidven:4)  ) Recent Results (from the past 240 hour(s))  SARS CORONAVIRUS 2 (TAT 6-24 HRS) Nasopharyngeal Nasopharyngeal Swab     Status: Abnormal   Collection Time: 02/22/2019  5:42 PM   Specimen: Nasopharyngeal Swab  Result Value Ref Range Status   SARS Coronavirus 2 POSITIVE (A) NEGATIVE  Final    Comment: RESULT CALLED TO, READ BACK BY AND VERIFIED WITH: Wendi Maya, RN AT [phone] ON 02/27/2019 BY SAINVILUS S (NOTE) SARS-CoV-2 target nucleic acids are DETECTED. The SARS-CoV-2 RNA is generally detectable in upper and lower respiratory specimens during the acute phase  of infection. Positive results are indicative of active infection with SARS-CoV-2. Clinical  correlation with patient history and other diagnostic information is necessary to determine patient infection status. Positive results do  not rule out bacterial infection or co-infection with other viruses. The expected result is Negative. Fact Sheet for Patients: SugarRoll.be Fact Sheet for Healthcare Providers: https://www.-mathews.com/ This test is not yet approved or cleared by the [place] FDA and  has been authorized for detection and/or diagnosis of SARS-CoV-2 by FDA under an Emergency Use Authorization (EUA). This EUA will remain  in effect (meaning this test can b e used) for the duration of the COVID-19 declaration under Section 564(b)(1) of the Act, 21 U.S.C. section 360bbb-3(b)(1), unless the authorization is terminated or revoked sooner. Performed at [facility], [place] [address], [place], [place] [address]   Culture, respiratory (non-expectorated)     Status: None (Preliminary result)   Collection Time: 02/27/19 11:30 AM   Specimen: Tracheal Aspirate; Respiratory  Result Value Ref Range Status   Specimen Description   Final    TRACHEAL ASPIRATE Performed at [place] [place] [address] [place], [place] [address]    Special Requests   Final    NONE Performed at [facility], [place] [address], [place], [place] [address]    Gram Stain   Final    RARE WBC PRESENT,BOTH PMN AND MONONUCLEAR RARE GRAM POSITIVE COCCI IN PAIRS Performed at [facility], [place] [address], [place], [place] [address]    Culture  PENDING  Incomplete   Report Status PENDING  Incomplete         Radiology Studies: Dg Abd 1 View  Result Date: 02/27/2019 CLINICAL DATA:  NG tube placement EXAM: ABDOMEN - 1 VIEW COMPARISON:  None. FINDINGS: NG tube tip and side port are in the proximal stomach. Nonobstructive bowel gas pattern. IMPRESSION: NG tube tip in the proximal stomach. Electronically Signed   By: Rolm Baptise M.D.   On: 02/27/2019 12:28   Ct Head Wo Contrast  Result Date: 02/27/2019 CLINICAL DATA:  Weakness cardiac arrest EXAM: CT HEAD WITHOUT CONTRAST TECHNIQUE: Contiguous axial images were obtained from the base of the skull through the vertex without intravenous contrast. COMPARISON:  None. FINDINGS: Brain: No evidence of acute infarction, hemorrhage, hydrocephalus, extra-axial collection or mass lesion/mass effect. Mild atrophy. Mild small vessel ischemic changes of the white matter. Vascular: No hyperdense vessels.  Carotid vascular calcification Skull: Normal. Negative for fracture or focal lesion. Sinuses/Orbits: Mild mucosal thickening in the ethmoid sinuses Other: None IMPRESSION: 1. No CT evidence for acute intracranial abnormality. 2. Atrophy and mild small vessel ischemic changes of the white matter Electronically Signed   By: Donavan Foil M.D.   On: 02/27/2019 01:16   Ct Angio Chest Pe W Or Wo Contrast  Result Date: 02/27/2019 CLINICAL DATA:  78 year old female positive COVID-19. Respiratory arrest, cardiac arrest. EXAM: CT ANGIOGRAPHY CHEST WITH CONTRAST TECHNIQUE: Multidetector CT imaging of the chest was performed using the standard protocol during bolus administration of intravenous contrast. Multiplanar CT image reconstructions and MIPs were obtained to evaluate the vascular anatomy. CONTRAST:  63m OMNIPAQUE IOHEXOL 350 MG/ML SOLN COMPARISON:  Portable chest 02/21/2019 and earlier. FINDINGS: Cardiovascular: Good contrast bolus timing in the pulmonary arterial tree. Respiratory motion. No central or  hilar pulmonary embolus. The proximal lobar branches also appear patent. More distal branch detail degraded by motion. Cardiomegaly. No pericardial effusion. No contrast in the aorta. Calcified aortic atherosclerosis. Calcified coronary  artery atherosclerosis. Mediastinum/Nodes: No mediastinal hematoma or lymphadenopathy. Enteric tube courses through the esophagus into the abdomen. Lungs/Pleura: Intubated. ET tube tip terminates above the carina. Atelectatic changes to the airways at the hila. Severe consolidation in the posterior right upper lobe. Similar consolidation in the peripheral left upper lobe, and posterior basal segment of the right lower lobe. Additional bilateral patchy and confluent peribronchial ground-glass and solid opacity in the remaining lobes. Trace if any pleural fluid. Upper Abdomen: Enteric tube continues into the stomach, tip not included. Hepatic steatosis. Negative visible spleen and right adrenal gland. Musculoskeletal: Mildly displaced anterior rib fractures: Bilateral ribs 2, 3, 4, 5 (up to moderately displaced on series 11, image 75), 6, 7, and 8. Additional lateral 6th 7th and 8th right rib fractures (flail segment) with similar displacement. No sternal fracture identified although detail is degraded by motion. No acute osseous abnormality identified. In the thoracic spine. Review of the MIP images confirms the above findings. IMPRESSION: 1. No central or hilar pulmonary embolus. Distal branches are degraded by motion. 2. Widespread bilateral COVID-19 pneumonia. Trace if any pleural fluid. 3. Widespread bilateral anterior rib fractures. The right ribs 6 through 8 are also fractured laterally (flail segment). 4. Satisfactory position of ET tube and visible enteric tube. 5. Cardiomegaly. Calcified coronary artery atherosclerosis. Aortic Atherosclerosis (ICD10-I70.0). Electronically Signed   By: Genevie Ann M.D.   On: 02/27/2019 01:42   Dg Chest Port 1 View  Result Date:  03/14/2019 CLINICAL DATA:  Status post cardiac arrest.  Hypoxia. EXAM: PORTABLE CHEST 1 VIEW COMPARISON:  February 26, 2019 study obtained earlier in the day FINDINGS: Endotracheal tube tip is 3.8 cm above the carina. Nasogastric tube tip and side port are below the diaphragm. No pneumothorax. There is airspace opacity in both upper lobes, more notable on the left than on the right. There is also patchy opacity in the right base. These are new findings compared to earlier in the day. Heart is upper normal in size with pulmonary vascularity normal. No adenopathy. No bone lesions. There is aortic atherosclerosis. IMPRESSION: Tube positions as described without pneumothorax. Multifocal airspace opacity, most notably in the left upper lobe. Question pneumonia versus aspiration. Both entities may be present concurrently. Stable cardiac silhouette. Aortic Atherosclerosis (ICD10-I70.0). Electronically Signed   By: Lowella Grip III M.D.   On: 02/16/2019 20:53   Dg Chest Port 1 View  Result Date: 03/16/2019 CLINICAL DATA:  Shortness of breath and nausea.  COVID-19 positive EXAM: PORTABLE CHEST 1 VIEW COMPARISON:  October 17, 2017. FINDINGS: There is ill-defined patchy opacity in the right mid lung and right base regions. Subtle patchy opacity in the left perihilar region and left base regions also noted. Heart is upper normal in size with pulmonary vascularity normal. No adenopathy. There is aortic atherosclerosis. No bone lesions. IMPRESSION: Multifocal ill-defined airspace opacity, somewhat more notable on the right, likely multifocal pneumonia. Atypical organism pneumonia suspected. No adenopathy. Heart upper normal in size. Aortic Atherosclerosis (ICD10-I70.0). Electronically Signed   By: Lowella Grip III M.D.   On: 02/17/2019 17:34   [place] Ekg Site Rite  Result Date: 02/27/2019 If Site Rite image not attached, placement could not be confirmed due to current cardiac rhythm.       Scheduled Meds:   [START ON 02/28/2019] aspirin  325 mg Oral Daily   chlorhexidine gluconate (MEDLINE KIT)  15 mL Mouth Rinse BID   Chlorhexidine Gluconate Cloth  6 each Topical Daily   Chlorhexidine Gluconate Cloth  6 each  Topical Q0600   dexamethasone (DECADRON) injection  8 mg Intravenous Q12H   insulin aspart  0-9 Units Subcutaneous Q4H   mouth rinse  15 mL Mouth Rinse 10 times per day   mupirocin ointment  1 application Nasal BID   sodium chloride flush  10-40 mL Intracatheter Q12H   Continuous Infusions:  sodium chloride Stopped (02/27/19 1133)   famotidine (PEPCID) IV Stopped (03/01/2019 2328)   fentaNYL infusion INTRAVENOUS 100 mcg/hr (02/27/19 1600)   heparin 1,100 Units/hr (02/27/19 1600)   [START ON 02/28/2019] levofloxacin (LEVAQUIN) IV     midazolam 0.5 mg/hr (02/27/19 1908)   norepinephrine (LEVOPHED) Adult infusion Stopped (02/27/19 5894)   remdesivir 100 mg in NS 250 mL     [START ON 02/28/2019] vancomycin       LOS: 1 day   The patient is critically ill with multiple organ systems failure and requires high complexity decision making for assessment and support, frequent evaluation and titration of therapies, application of advanced monitoring technologies and extensive interpretation of multiple databases. Critical Care Time devoted to patient care services described in this note  Time spent: 40 minutes     Kiyo Heal, Geraldo Docker, MD [facility] Pager [phone]  If 7PM-7AM, please contact night-coverage [email or web address] Password [email or web address] 02/27/2019, 7:15 PM

## 2019-02-27 NOTE — Progress Notes (Signed)
ANTICOAGULATION CONSULT NOTE - Initial Consult  Pharmacy Consult for Heparin Indication: chest pain/ACS  Allergies  Allergen Reactions  . Fluoxetine Other (See Comments)    Altered mental status  . Adhesive [Tape] Rash    Cannot tolerate any tape or bandaids more than 24 hrs.  . Demerol [Meperidine Hcl] Rash  . Sulfa Antibiotics Rash    Patient Measurements: Height: 5\' 4"  (162.6 cm) Weight: 296 lb 4.8 oz (134.4 kg) IBW/kg (Calculated) : 54.7 Heparin dosing weight: 88kg  Vital Signs: Temp: 101.1 F (38.4 C) (10/14 0005) Temp Source: Oral (10/13 1613) BP: 152/100 (10/14 0005) Pulse Rate: 78 (10/14 0005)  Labs: Recent Labs    03-20-19 1742 Mar 20, 2019 2024 03/20/19 2215 20-Mar-2019 2224 03/20/2019 2237  HGB 11.7*  --  11.1*  --   --   HCT 35.4*  --  34.6*  --   --   PLT 145*  --  155  --   --   APTT  --   --  43*  --   --   LABPROT  --   --  15.7*  --   --   INR  --   --  1.3*  --   --   CREATININE 1.42*  --   --   --  1.44*  TROPONINIHS  --  313*  --  444*  --     Estimated Creatinine Clearance: 43.3 mL/min (A) (by C-G formula based on SCr of 1.44 mg/dL (H)).   Medical History: Past Medical History:  Diagnosis Date  . Arthritis    "inflammatory" (10/17/2017)  . Ascending aorta dilatation (HCC)    62mm by echo 06/2018  . Female bladder prolapse   . Hypertension   . Mitral regurgitation 12/14/2017   Moderate to severe by TEE/DCCV 09-2017, moderate by echo 06/2018  . Morbid obesity ([place])   . On home oxygen therapy    "2L; 24/7" (10/17/2017)  . PAF (paroxysmal atrial fibrillation) (HCC) paf  . Peripheral neuropathy    "BLE" (10/17/2017)  . Polymyalgia rheumatica ([place])   . Recurrent UTI (urinary tract infection)     Medications:  Infusions:  . sodium chloride Stopped (03/20/19 2319)  . sodium chloride 100 mL/hr at 20-Mar-2019 2313  . famotidine (PEPCID) IV 20 mg (20-Mar-2019 2324)  . fentaNYL infusion INTRAVENOUS 25 mcg/hr (02/27/19 0142)  . norepinephrine (LEVOPHED)  Adult infusion    . propofol (DIPRIVAN) infusion    . propofol    . [START ON 02/28/2019] remdesivir 100 mg in NS 250 mL      PTA Eliquis 5mg  po BID- LD unknown, but in past week  Assessment: 78 yo F on Eliquis PTA for hx Afib.  Admit with COVID PNA and PEA arrest.   Chest CT negative for PE.  Troponin elevated.  Currently intubated and unable to take Eliquis.  Pharmacy consulted to start IV heparin for ACS. CBC: Hg low (11.1), Pltc WNL at low end of range (155) Baseline aPTT (43sec) & INR (1.3) slightly above normal range.  Baseline heparin level 2.08- elevated due to Eliquis.    Goal of Therapy:  APTT 66-102 sec Heparin level 0.3-0.7 units/ml Monitor platelets by anticoagulation protocol: Yes   Plan:   Initiate heparin infusion at 1100 units/hr  Check 8h aPTT- will adjust heparin using aPTT until Eliquis cleared/heparin levels correlate to aPTT  Daily heparin level & CBC while on heparin  Netta Cedars, PharmD, BCPS 02/27/2019,1:39 AM

## 2019-02-27 NOTE — Progress Notes (Signed)
Pt biting tongue and tongue turning black from decrease circulation, increased sedation and was able to place bite block

## 2019-02-27 NOTE — Progress Notes (Signed)
Results for KENYIA, WAMBOLT (MRN [id number]) as of 02/27/2019 04:13  Ref. Range 02/27/2019 03:45  FIO2 Unknown 100.00  pH, Arterial Latest Ref Range: 7.350 - 7.450  7.367  pCO2 arterial Latest Ref Range: 32.0 - 48.0 mmHg 44.3  pO2, Arterial Latest Ref Range: 83.0 - 108.0 mmHg 108  Acid-base deficit Latest Ref Range: 0.0 - 2.0 mmol/L 0.1  Bicarbonate Latest Ref Range: 20.0 - 28.0 mmol/L 24.8  O2 Saturation Latest Units: % 97.4  Patient temperature Unknown 98.6  Allens test (pass/fail)  Latest Ref Range: PASS  PASS   Abg drawn while pt on vent.  Settings: PRVC mode, VT 451ml, rr18, 100%, +13 peep.

## 2019-02-27 NOTE — Progress Notes (Signed)
PHARMACY NOTE:  ANTIMICROBIAL RENAL DOSAGE ADJUSTMENT  Current antimicrobial regimen includes a mismatch between antimicrobial dosage and estimated renal function.  As per policy approved by the Pharmacy & Therapeutics and Medical Executive Committees, the antimicrobial dosage will be adjusted accordingly.  Current antimicrobial dosage:  levaquin 500 mg daily  Indication: CAP  Renal Function:  Estimated Creatinine Clearance: 48.7 mL/min (A) (by C-G formula based on SCr of 1.28 mg/dL (H)). []      On intermittent HD, scheduled: []      On CRRT    Antimicrobial dosage has been changed to:  levaquin 750 mg IV q48h   Thank you for allowing pharmacy to be a part of this patient's care.  Lynelle Doctor, [facility] 02/27/2019 9:31 AM

## 2019-02-27 NOTE — Progress Notes (Signed)
Pt biting tongue, attempted to place bite block. Unable to place, RT notified

## 2019-02-27 NOTE — Progress Notes (Signed)
eLink Physician-Brief Progress Note Patient Name: [name] [name] DOB: April 07, 1941 MRN: [id number]   Date of Service  02/27/2019  HPI/Events of Note  Heart rate 55  eICU Interventions  No new orders, will monitor closely, RN to notify Elink if heart rate < 50 or blood pressure drops below a MAP of 65 mmHg        [place] U Ogan 02/27/2019, 3:51 AM

## 2019-02-27 NOTE — Progress Notes (Signed)
Left wrist PIV leaking, heparin moved to picc line and piv d/c'd. Left arm red and has some weeping and edema. Erline Levine RN at [facility] notified

## 2019-02-27 NOTE — Patient Outreach (Signed)
  Why [facility]) Care Management Chronic Special Needs Program    02/27/2019  Name: [name] [name], DOB: 26-Apr-1941  MRN: [id number]   [name] is enrolled in a chronic special needs plan. Client admitted to the hospital on 26-Mar-2019. Per ED note presents to ED with c/o increased cough, SOB, reported covid positive, respiratory arrest in ED-intubated. Per policy and procedure, RNCM will send individualized care plan to hospital.  Plan: RNCM will continue to follow as client's C-SNP chronic care management coordinator.  Thea Silversmith, RN, MSN, [place] [phone]

## 2019-02-27 NOTE — Progress Notes (Signed)
  Echocardiogram 2D Echocardiogram has been performed.  [name] [name] 02/27/2019, 4:18 PM

## 2019-02-27 NOTE — ED Notes (Signed)
Date and time results received: 02/17/2019 2358  Test: Troponin  Critical Value: 444  Name of Provider Notified: Dr. Arnetha Gula, with [facility]   Orders Received? Or Actions Taken?: Will Continue to monitor and await new orders.

## 2019-02-27 NOTE — Progress Notes (Signed)
  Pt arrived via [facility] from [facility]. Pt current vent settings: PRVC 460/18/100%/+13. Dr. Lake Bells notified of settings and order received to change settings to [facility]: 430/28/60%/+16. RT to obtain ABG and will continue to monitor. ETT 7.5 @23cm  at the lip. Subglottic set to intermittent suction.

## 2019-02-27 NOTE — Progress Notes (Signed)
ANTICOAGULATION CONSULT NOTE - Initial Consult  Pharmacy Consult for Heparin Indication: chest pain/ACS  Allergies  Allergen Reactions  . Fluoxetine Other (See Comments)    Altered mental status  . Adhesive [Tape] Rash    Cannot tolerate any tape or bandaids more than 24 hrs.  . Demerol [Meperidine Hcl] Rash  . Sulfa Antibiotics Rash    Patient Measurements: Height: 5\' 4"  (162.6 cm) Weight: 296 lb 4.8 oz (134.4 kg) IBW/kg (Calculated) : 54.7 Heparin dosing weight: 88kg  Vital Signs: Temp: 97 F (36.1 C) (10/14 1800) Temp Source: Bladder (10/14 1600) BP: 82/58 (10/14 1800) Pulse Rate: 57 (10/14 1800)  Labs: Recent Labs    Mar 15, 2019 2024 Mar 15, 2019 2215 15-Mar-2019 2224 03/15/2019 2237 02/27/19 0152 02/27/19 0243 02/27/19 0245 02/27/19 1056 02/27/19 1800 02/27/19 1825  HGB  --  11.1*  --   --  11.0* 10.5*  --   --   --  9.5*  HCT  --  34.6*  --   --  34.5* 33.2*  --   --   --  28.0*  PLT  --  155  --   --  137* 132*  --   --   --   --   APTT  --  43*  --   --   --   --   --   --  78*  --   LABPROT  --  15.7*  --   --   --   --   --   --   --   --   INR  --  1.3*  --   --   --   --   --   --   --   --   HEPARINUNFRC  --   --   --   --   --   --  2.08*  --   --   --   CREATININE  --   --   --  1.44* 1.28*  --   --  1.43*  --   --   TROPONINIHS 313*  --  444*  --   --   --   --   --   --   --     Estimated Creatinine Clearance: 43.6 mL/min (A) (by C-G formula based on SCr of 1.43 mg/dL (H)).   Medical History: Past Medical History:  Diagnosis Date  . Arthritis    "inflammatory" (10/17/2017)  . Ascending aorta dilatation (HCC)    35mm by echo 06/2018  . Female bladder prolapse   . Hypertension   . Mitral regurgitation 12/14/2017   Moderate to severe by TEE/DCCV 09-2017, moderate by echo 06/2018  . Morbid obesity (HCC)   . On home oxygen therapy    "2L; 24/7" (10/17/2017)  . PAF (paroxysmal atrial fibrillation) (HCC) paf  . Peripheral neuropathy    "BLE" (10/17/2017)   . Polymyalgia rheumatica (HCC)   . Recurrent UTI (urinary tract infection)     Medications:  Infusions:  . sodium chloride Stopped (02/27/19 1133)  . famotidine (PEPCID) IV Stopped (03-15-19 2328)  . fentaNYL infusion INTRAVENOUS 100 mcg/hr (02/27/19 1600)  . heparin 1,100 Units/hr (02/27/19 1600)  . [START ON 02/28/2019] levofloxacin (LEVAQUIN) IV    . midazolam    . norepinephrine (LEVOPHED) Adult infusion Stopped (02/27/19 03/01/19)  . propofol (DIPRIVAN) infusion 15 mcg/kg/min (02/27/19 1822)  . remdesivir 100 mg in NS 250 mL    . [START ON 02/28/2019] vancomycin  PTA Eliquis 5mg  po BID- LD unknown, but in past week  Assessment: 78 yo F on Eliquis PTA for hx Afib.  Admit with COVID PNA and PEA arrest.   Chest CT negative for PE.  Troponin elevated.  Currently intubated and unable to take Eliquis.  Pharmacy consulted to start IV heparin for ACS. CBC: Hg low (11.1), Pltc WNL at low end of range (155) Baseline aPTT (43sec) & INR (1.3) slightly above normal range.  Baseline heparin level 2.08- elevated due to Eliquis.    PM Update - aPTT 78 at goal - Hgb decreased to 9.5 (dilutional?) - minor bleeding from arm reported per RN - no line issues per RN  Goal of Therapy:  APTT 66-102 sec Heparin level 0.3-0.7 units/ml Monitor platelets by anticoagulation protocol: Yes   Plan:   Continue heparin infusion at 1100 units/hr  Check 8h aPTT- will adjust heparin using aPTT until Eliquis cleared/heparin levels correlate to aPTT  Daily heparin level & CBC while on heparin  Ulice Dash D, PharmD, BCPS 02/27/2019,6:47 PM

## 2019-02-27 NOTE — Progress Notes (Signed)
[facility] P[facility]M  Arrived from [facility] this afternoon  dyssynchrony on vent, TVol is currently > 8cc/kg IBW  Plan Increase RR 28 Decrease TVol 8cc Repeat ABG Change propofol to versed Add rocuronium x1 dose  Roselie Awkward, MD [facility] Pager: [phone] Cell: [phone] If no response, call [phone]

## 2019-02-27 NOTE — Progress Notes (Signed)
RT and RN together attempted to placed bite block- unsuccessful at this time. Ordered sputum sample collected and walked to lab at approx 1150. RN aware.

## 2019-02-27 NOTE — Progress Notes (Signed)
Pharmacy Antibiotic Note  [name] [name] is a 78 y.o. female admitted on 03-13-2019 with diagnosed as being Covid positive 10/12 presented with increased cough shortness of breath & hypoxia requiring 12L O2 , while being initially evaluated in the emergency room patient had a pulseless arrest.  Pharmacy has been consulted for vancomycin dosing.  Plan: Vancomycin 2500mg  IV x 1 then 1500mg  q36h (AUC 497, Scr 1.28) Follow renal function and clinical course vanc levels as needed  Height: 5\' 4"  (162.6 cm) Weight: 296 lb 4.8 oz (134.4 kg) IBW/kg (Calculated) : 54.7  Temp (24hrs), Avg:100.9 F (38.3 C), Min:97.2 F (36.2 C), Max:102.9 F (39.4 C)  Recent Labs  Lab 13-Mar-2019 1742 Mar 13, 2019 1929 03-13-2019 2215 03-13-2019 2237 02/27/19 0152 02/27/19 0243  WBC 6.5  --  10.7*  --  8.9 8.3  CREATININE 1.42*  --   --  1.44* 1.28*  --   LATICACIDVEN  --  1.2  --   --   --   --     Estimated Creatinine Clearance: 48.7 mL/min (A) (by C-G formula based on SCr of 1.28 mg/dL (H)).    Allergies  Allergen Reactions  . Fluoxetine Other (See Comments)    Altered mental status  . Adhesive [Tape] Rash    Cannot tolerate any tape or bandaids more than 24 hrs.  . Demerol [Meperidine Hcl] Rash  . Sulfa Antibiotics Rash    Antimicrobials this admission: 10/13 levaquin >> 10/14 vanc >>   Dose adjustments this admission:   Microbiology results: 10/13 BCx:  10/13 UCx:      Thank you for allowing pharmacy to be a part of this patient's care.  Dolly Rias RPh 02/27/2019, 10:43 AM Pager [phone]

## 2019-02-27 NOTE — Progress Notes (Addendum)
NAME:  [name] [name], MRN:  [id number], DOB:  1941-01-11, LOS: 1 ADMISSION DATE:  02/14/2019, CONSULTATION DATE: 03/12/2019 REFERRING MD: ED, CHIEF COMPLAINT: New Covid diagnosis, hypoxemia, status post arrest  Brief History   Patient diagnosed as being Covid positive 10/12 presented with increased cough shortness of breath & hypoxia requiring 12L O2 , while being initially evaluated in the emergency room patient had a pulseless arrest.  Rhythm was asystole . Patient responded 3 doses of epinephrine during CPR with return of rhythm and blood pressure.  Past Medical History   . Arthritis    "inflammatory" (10/17/2017)  . Ascending aorta dilatation (HCC)    18mm by echo 06/2018  . Female bladder prolapse   . Hypertension   . Mitral regurgitation 12/14/2017   Moderate to severe by TEE/DCCV 09-2017, moderate by echo 06/2018  . Morbid obesity (HCC)   . On home oxygen therapy    "2L; 24/7" (10/17/2017)  . PAF (paroxysmal atrial fibrillation) (HCC) paf  . Peripheral neuropathy    "BLE" (10/17/2017)  . Polymyalgia rheumatica (HCC)   . Recurrent UTI (urinary tract infection)      Significant Hospital Events   Status post arrest 03/16/2019  Consults:  N/A  Procedures:  Intubation  Significant Diagnostic Tests:  Head CT 10/13 >> neg CT angio chest 10/13 >> neg PE, consolidation bilateral, with patchy infiltrates,  mildly displaced anterior rib fractures: Bilateral ribs 2, 3, 4, 5 (up to moderately displaced on series 11, image 75), 6, 7, and 8. Additional lateral 6th 7th and 8th right rib fractures (flail segment) with similar displacement.  Micro Data:  10/13 SARS CoV2 POS bld 10/13 >> Urine 10/13 >>  Antimicrobials:  Levaquin 10/13> remdesevir 10/13 >>   Interim history/subjective:   Critically ill, sedated on fentanyl and propofol Neuro: Eyes, T-max 101 600 cc urine output   Objective   Blood pressure (!) 159/97, pulse (!) 55, temperature (!) 97.2 F (36.2  C), temperature source Bladder, resp. rate 11, height 5\' 4"  (1.626 m), weight 134.4 kg, SpO2 100 %.    Vent Mode: PRVC FiO2 (%):  [100 %] 100 % Set Rate:  [14 bmp-18 bmp] 18 bmp Vt Set:  [460 mL] 460 mL PEEP:  [5 cmH20-13 cmH20] 13 cmH20 Plateau Pressure:  [22 cmH20-28 cmH20] 25 cmH20   Intake/Output Summary (Last 24 hours) at 02/27/2019 0912 Last data filed at 02/27/2019 03/01/2019 Gross per 24 hour  Intake 1077.76 ml  Output 725 ml  Net 352.76 ml   Filed Weights   02/27/19 0134  Weight: 134.4 kg    Examination: General: obese wf , intubated, no distress  HENT: Oral ETT, mild pallor, no icterus Lungs: dimisnished bilateral, no rhonchi Cardiovascular: reg rate , bradycardic Abdomen: Distended, soft, no guarding, dark brown aspirate Extremities: wnl Neuro: non purposeful movement to deep pain, RA SS -3 GU: nl  Resolved Hospital Problem list   N/A  Assessment & Plan:  ARDS - COVID versus aspiration - Decadron and remdesivir. -Lung protective ventilation with tidal volume 8 cc/kg, lowered to 6 cc/kg -PF ratio is less than 150, but will not prone today given abdominal distention -Severe vent asynchrony is present and will consider paralytic once we have a good neuro check off sedation -Has flail chest - which will make weaning difficult  Status post asystolic arrest.  Etiology of this is unclear.  May have been related to extreme hypoxia -Obtain echo, does have moderate MR  Aspiration pneumonia-multiple admission chest x-ray arrest episodes obtain  respiratory culture Empiric Levaquin and vancomycin meantime  At risk anoxic encephalopathy -continue propofol and fentanyl for sedation with goal RA SS -1 to -2 and vent synchrony Would like neuro check off sedation to prognosticate Check EEG for completion Arctic sun pads not available , will aim for temperature 36-37 & avoid  fevers  use Tylenol around-the-clock if needed  Abdominal distention -x-ray abdomen OG to low  intermittent suction If distention improves then can consider proning Hold tube feeds  History of paroxysmal atrial fibrillation/moderate MR-was on Eliquis Use IV heparin    History of chronic polymyalgia rheumatica by chart: Patient takes 5- 10 mg of prednisone daily for this. Will use Decadron for Covid positive status which should cross cover her chronic steroid use.   Summary-appears to be COVID pneumonia with superimposed aspiration and now flail chest p- Cardiac arrest.  Would like a good neuro exam to rule out anoxic encephalopathy. Unfortunately  ARDS protocol is limited by abdominal distention and vent asynchrony  Best practice:  Diet: N.p.o. Pain/Anxiety/Delirium protocol (if indicated): Propofol + fent gtt VAP protocol (if indicated): Levaquin DVT prophylaxis: SCDs GI prophylaxis: Pepcid Glucose control: CBG Mobility: Bedrest Code Status: Full Family Communication:  Husband 10/13 , tearful 'I want my wife alive' Disposition: to ICU  The patient is critically ill with multiple organ systems failure and requires high complexity decision making for assessment and support, frequent evaluation and titration of therapies, application of advanced monitoring technologies and extensive interpretation of multiple databases. Critical Care Time devoted to patient care services described in this note independent of APP/resident  time is 45 minutes.   Kara Mead MD. [place]ade Flood. [facility] Pager [phone] If no response call [phone]   02/27/2019

## 2019-02-27 NOTE — Progress Notes (Signed)
EEG complete - results pending 

## 2019-02-27 NOTE — Progress Notes (Signed)
Initial Nutrition Assessment  RD working remotely.   DOCUMENTATION CODES:   Morbid obesity  INTERVENTION:  - once TF able to be started, recommended goal rate of Vital High Protein @ 60 ml/hr with 30 ml prostat TID. - this goal rate regimen + kcal from current propofol rate will provide 1846 kcal, 171 grams protein, and 1204 ml free water.  - will monitor propofol rate and changes and if patient is proned as these things may change TF recommendation.    NUTRITION DIAGNOSIS:   Increased nutrient needs related to acute illness, other (see comment)(COVID-19) as evidenced by estimated needs.  GOAL:   Provide needs based on ASPEN/SCCM guidelines  MONITOR:   Vent status, Labs, Weight trends  REASON FOR ASSESSMENT:   Ventilator  ASSESSMENT:   78 year old female with medical history of arthritis, bladder prolapse, HTN, mitral valve regurgitation, morbid obesity, PAF, peripheral neuropathy, polymyalgia rheumatica, recurrent UTIs, need for O2 at home. Patient presented to the ED on 10/13 and was informed on 10/12 that she was COVID+. In the ED, patient became progressively hypoxemic and dyspneic with elevated respiratory rate. She then became pulseless and apneic. During CPR her rhythm was found to be asystole. She was intubated and rhythm returned to normal sinus.  Patient remains intubated with OGT in place to LIS. Able to talk with RN who reports that there is some output in the tubing, but no output in the canister at this time. RN note from a few minutes ago indicates patient has been attempting to bite her tongue, bite block attempted to be placed, but unable to be placed.   Per chart review, current weight is 296 lb and weight on 5/22 was 274 lb. This indicates 22 lb weight gain in the past 5 months.   Per notes: - ARDS--d/t COVID vs aspiration; severe vent asynchrony and possible addition of paralytic - s/p asystolic arrest--unclear etiology, possible d/t severe hypoxia -  aspiration PNA - at risk for anoxic encephalopathy--plan for EEG; arctic sun pads unavailable (goal temp 36-37 degrees C) - abdominal distention--x-ray abdomen; OGT to LIS, holding TF; if distention improves then considering proning   Patient is currently intubated on ventilator support MV: 8.9 L/min Temp (24hrs), Avg:100.9 F (38.3 C), Min:97.2 F (36.2 C), Max:102.9 F (39.4 C) Propofol: 4.03 ml/hr (106 kcal)   Labs reviewed; CBGs: 159 and 136 mg/dl, Na: 604 mmol/l, K: 3.2 mmol/l, Cl: 97 mmol/l, creatinine: 1.28 mg/dl, Ca: 7 mg/dl, LFTs elevated, GFR: 40 ml/min.  Medications reviewed; 20 mg IV pepcid/day, sliding scale novolog, 10 mEq IV KCl x1 run 10/13, 200 mg remdesivir x1 dose 10/14, 100 mg remdesivir x1 dose x4 days starting 10/14. IVF; NS @ 50 ml/hr.  Drips; fentanyl @ 50 mcg/hr, heparin @ 1100 units/hr, propofol @ 5 mcg/kg/min.    NUTRITION - FOCUSED PHYSICAL EXAM:  unable to complete for COVID+ patient  Diet Order:   Diet Order            Diet NPO time specified  Diet effective now              EDUCATION NEEDS:   No education needs have been identified at this time  Skin:  Skin Assessment: Reviewed RN Assessment  Last BM:  PTA/unknown  Height:   Ht Readings from Last 1 Encounters:  03-Mar-2019 5\' 4"  (1.626 m)    Weight:   Wt Readings from Last 1 Encounters:  02/27/19 134.4 kg    Ideal Body Weight:  54.5 kg  BMI:  Body mass index is 50.86 kg/m.  Estimated Nutritional Needs:   Kcal:  1478-1882 kcal (11-14 kcal/kg)  Protein:  >/= 161 grams (1.2 grams/kg actual weight)  Fluid:  >/= 2 L/day      Jarome Matin, MS, RD, LDN, [facility] Inpatient Clinical Dietitian Pager # [phone] After hours/weekend pager # [phone]

## 2019-02-27 NOTE — Consult Note (Signed)
Cardiology Consultation:   Patient ID: [name] [name]; [id number]; 10-25-1940   Admit date: 09-Mar-2019 Date of Consult: 02/27/2019  Primary Care Provider: Lelon Huh [name], MD Primary Cardiologist: Armanda Magic, MD Primary Electrophysiologist:  None   Patient Profile:   [name] [name] is a 78 y.o. female with a PMH of chronic combined CHF (EF 45-50% 09/2017, improved to 55% 06/2018) felt to be tachycardia mediated, paroxysmal atrial fibrillation on eliquis, HTN, mitral regurgitation, chronic respiratory failure with hx of home O2 use but not currently, polymyalgia rheumatica on chronic steroids and methotrexate, and morbid obesity,  who is being seen today for the evaluation of post-arrest at the request of Dr. Rande Lawman.  History of Present Illness:   [name] presented to the ED 03-09-2019 with complaints of shortness of breath, cough, and weakness. She was seen by her PCP 02/25/2019 with similar complaints as well as fevers, for which she was sent for COVID-19 testing and was positive. COVID-19 confirmed to be positive this admission. While in the ED she became progressively hypoxemic and dyspneic. She subsequently became pulsless and apneic. Rhythm unknown as she was not on telemetry at that time. CPR was initiated. Patient was intubated and rhythm determined to be asystole during code. ROSC achieved after 3 rounds of epi with total down time ~15 minutes. She was admitted to PCCM and cooling protocol initiated. She was started on decadron and remdesivir for management of COVID-19. Cardiology asked to evaluate.  She was last evaluated by cardiology via a telemedicine visit with Dr. Mayford Knife 10/05/2018, at which time she was doing well from a cardiac standpoint. She was recommended to blood work to monitor thyroid level and PFTs given chronic amiodarone use, though these were not completed. Also recommended for a repeat echocardiogram 12/2018 for monitoring of her mitral regurgitation thought  his was not completed either. She was evaluated by Dr. Excell Seltzer with the structural heart team for consideration for MitraClip, however patient did not want to pursue this at that time. Additionally she was recommended to undergo surveillence CTA chest/abdomen for monitoring of her ascending aortic aneurysm measuring 4.3cm, though again, this was not completed. Per telephone note 02/22/2019, patient cancelled her follow-up cardiac studies because she did not tolerate wearing a mask and preferred to wait until masks were no longer required for testing. Her last echo 06/2018 showed EF 55% (previously 45-50% 09/2017), G2DD, no RWMA, moderate LAE, mild RAE, moderate MR, and ascending aorta measuring 45mm. Her cardiomyopathy was felt to be tachycardia mediated. She has not had prior ischemic testing, though has been noted to have aortic atherosclerosis and coronary artery calcification on imaging.    Patient has been febrile this admission (Tmax 102.9) though is now cooled to 97, tachypneic and hypoxic on arrival, intermittently hypertensive. Labs notable for Na 134, K 3.2, Cr 1.4, WBC wnl, Hgb 11.7, PLT 145, LDH 407^, transaminitis, Trop 313>444, COVID-19 positive. UA with moderate blood and large leukocytes with >50 wbc; UCx pending. Initial EKG on arrival with sinus rhythm with submm STD in V3-6; QTc 516. Repeat EKG (suspect this is post-arrest) with sinus rhythm with new RBBB and q waves in inferior leads. CXR with multifocal opacity most notably in the LUL +/- aspiration PNA. CTA chest without PE but revealed widespread bilateral COVID-19 PNA, bilateral rib fractures, and coronary/aortic calcifications. Unclear etiology of her cardiac arrest.   Past Medical History:  Diagnosis Date   Arthritis    "inflammatory" (10/17/2017)   Ascending aorta dilatation (HCC)    31mm  by echo 06/2018   Female bladder prolapse    Hypertension    Mitral regurgitation 12/14/2017   Moderate to severe by TEE/DCCV 09-2017,  moderate by echo 06/2018   Morbid obesity (HCC)    On home oxygen therapy    "2L; 24/7" (10/17/2017)   PAF (paroxysmal atrial fibrillation) (HCC) paf   Peripheral neuropathy    "BLE" (10/17/2017)   Polymyalgia rheumatica (HCC)    Recurrent UTI (urinary tract infection)     Past Surgical History:  Procedure Laterality Date   ABDOMINAL HYSTERECTOMY     CARDIOVERSION N/A 10/11/2017   Procedure: CARDIOVERSION;  Surgeon: Nahser, Deloris PingPhilip J, MD;  Location: [facility];  Service: Cardiovascular;  Laterality: N/A;   CATARACT EXTRACTION W/ INTRAOCULAR LENS  IMPLANT, BILATERAL     JOINT REPLACEMENT     LAPAROSCOPIC CHOLECYSTECTOMY     TEE WITHOUT CARDIOVERSION N/A 10/11/2017   Procedure: TRANSESOPHAGEAL ECHOCARDIOGRAM (TEE);  Surgeon: Elease HashimotoNahser, Deloris PingPhilip J, MD;  [facility];  Service: Cardiovascular;  Laterality: N/A;   TOTAL KNEE ARTHROPLASTY Left 2008     Home Medications:  Prior to Admission medications   Medication Sig Start Date End Date Taking? Authorizing Provider  alendronate (FOSAMAX) 70 MG tablet Take 70 mg by mouth once a week. 03/28/18  Yes [provider]  amiodarone (PACERONE) 200 MG tablet Take 1 tablet (200 mg total) by mouth daily. 06/06/18  Yes Turner, Cornelious Bryantraci R, MD  carvedilol (COREG) 25 MG tablet TAKE 1 TABLET(25 MG) BY MOUTH TWICE DAILY WITH A MEAL Patient taking differently: Take 25 mg by mouth 2 (two) times daily with a meal.  11/05/18  Yes Turner, Traci R, MD  cholecalciferol (VITAMIN D) 1000 units tablet Take 2,000 Units by mouth daily.   Yes [provider]  ELIQUIS 5 MG TABS tablet TAKE 1 TABLET(5 MG) BY MOUTH TWICE DAILY Patient taking differently: Take 5 mg by mouth 2 (two) times daily.  10/15/18  Yes Turner, Cornelious Bryantraci R, MD  folic acid (FOLVITE) 1 MG tablet Take 1 mg by mouth daily. 09/19/17  Yes [provider]  gabapentin (NEURONTIN) 300 MG capsule Take 300 mg by mouth at bedtime. 08/22/17  Yes [provider]    HYDROcodone-acetaminophen (NORCO) 10-325 MG tablet Take 1 tablet by mouth every 6 (six) hours as needed for pain. 09/29/17  Yes [provider]  losartan (COZAAR) 25 MG tablet TAKE 1 TABLET(25 MG) BY MOUTH DAILY Patient taking differently: Take 25 mg by mouth daily.  10/30/18  Yes Turner, Cornelious Bryantraci R, MD  methotrexate (RHEUMATREX) 2.5 MG tablet Take 15 mg by mouth once a week. Take on wednesdays 09/19/17  Yes [provider]  potassium chloride (K-DUR) 10 MEQ tablet Take (3) 10 mg tablets by mouth in the morning and 2 (10) mg tablets by mouth in the evening for a total of 50 mg per day. 06/06/18  Yes Turner, Cornelious Bryantraci R, MD  predniSONE (DELTASONE) 1 MG tablet Take 9 mg by mouth daily with breakfast.   Yes [provider]  torsemide (DEMADEX) 20 MG tablet TAKE 3 TABS (60 MG) BY MOUTH EVERY AM, TAKE 2 TABS (40 MG) EVERY PM 02/25/19  Yes Quintella Reicherturner, Traci R, MD    Inpatient Medications: Scheduled Meds:  [START ON 02/28/2019] aspirin  325 mg Oral Daily   Chlorhexidine Gluconate Cloth  6 each Topical Daily   Chlorhexidine Gluconate Cloth  6 each Topical Q0600   dexamethasone (DECADRON) injection  8 mg Intravenous Q12H   insulin aspart  0-9  Units Subcutaneous Q4H   mupirocin ointment  1 application Nasal BID   sodium chloride (PF)       Continuous Infusions:  sodium chloride Stopped (02/27/19 0051)   sodium chloride 100 mL/hr at Mar 18, 2019 2313   famotidine (PEPCID) IV Stopped (03/18/19 2328)   fentaNYL infusion INTRAVENOUS 75 mcg/hr (02/27/19 0600)   heparin 1,100 Units/hr (02/27/19 0600)   norepinephrine (LEVOPHED) Adult infusion Stopped (02/27/19 7106)   propofol     propofol (DIPRIVAN) infusion 10 mcg/kg/min (02/27/19 0600)   remdesivir 100 mg in NS 250 mL     PRN Meds: acetaminophen, ondansetron (ZOFRAN) IV  Allergies:    Allergies  Allergen Reactions   Fluoxetine Other (See Comments)    Altered mental status   Adhesive [Tape] Rash    Cannot tolerate  any tape or bandaids more than 24 hrs.   Demerol [Meperidine Hcl] Rash   Sulfa Antibiotics Rash    Social History:   Social History   Socioeconomic History   Marital status: Married    Spouse name: Not on file   Number of children: Not on file   Years of education: Not on file   Highest education level: Not on file  Occupational History   Not on file  Social Needs   Financial resource strain: Not on file   Food insecurity    Worry: Not on file    Inability: Not on file   Transportation needs    Medical: Not on file    Non-medical: Not on file  Tobacco Use   Smoking status: Former Smoker    Packs/day: 2.00    Years: 30.00    Pack years: 60.00    Types: Cigarettes    Quit date: 10/25/2006    Years since quitting: 12.3   Smokeless tobacco: Never Used  Substance and Sexual Activity   Alcohol use: Not Currently   Drug use: Never   Sexual activity: Not Currently  Lifestyle   Physical activity    Days per week: Not on file    Minutes per session: Not on file   Stress: Not on file  Relationships   Social connections    Talks on phone: Not on file    Gets together: Not on file    Attends religious service: Not on file    Active member of club or organization: Not on file    Attends meetings of clubs or organizations: Not on file    Relationship status: Not on file   Intimate partner violence    Fear of current or ex partner: Not on file    Emotionally abused: Not on file    Physically abused: Not on file    Forced sexual activity: Not on file  Other Topics Concern   Not on file  Social History Narrative   Not on file    Family History:    Family History  Problem Relation Age of Onset   Alzheimer's disease Mother    Heart disease Brother      ROS:  Please see the history of present illness.   All other ROS reviewed and negative.     Physical Exam/Data:   Vitals:   02/27/19 0400 02/27/19 0500 02/27/19 0600 02/27/19 0613  BP:  102/62 98/66  (!) 162/95  Pulse: (!) 41 (!) 53 (!) 56 66  Resp: 19 17 16 20   Temp: 98.2 F (36.8 C) 97.9 F (36.6 C) 97.7 F (36.5 C) (!) 97.5 F (36.4 C)  TempSrc:  SpO2: 91% (!) 86% (!) 84% 95%  Weight:      Height:        Intake/Output Summary (Last 24 hours) at 02/27/2019 0757 Last data filed at 02/27/2019 0600 Gross per 24 hour  Intake 995.79 ml  Output 600 ml  Net 395.79 ml   Filed Weights   02/27/19 0134  Weight: 134.4 kg   Body mass index is 50.86 kg/m.   Physical examination deferred given COVID-19 status Morbidly obese female; intubated and sedated   EKG:  The EKG was personally reviewed and demonstrates:  Initial EKG on arrival with sinus rhythm with submm STD in V3-6; QTc 516. Repeat EKG (suspect this is post-arrest) with sinus rhythm with new RBBB and q waves in inferior leads. Telemetry:  Telemetry was personally reviewed and demonstrates:  Sinus rhythm   Relevant CV Studies: 1. The left ventricle has a visually estimated ejection fraction of of 55%. The cavity size was normal. There is mildly increased left ventricular wall thickness. Left ventricular diastolic Doppler parameters are consistent with pseudonormalization  Elevated left ventricular end-diastolic pressure No evidence of left ventricular regional wall motion abnormalities.  2. The right ventricle has normal systolic function. The cavity was normal. There is no increase in right ventricular wall thickness.  3. Left atrial size was moderately dilated.  4. Right atrial size was mildly dilated.  5. The mitral valve is normal in structure. There is mild mitral annular calcification present. Mitral valve regurgitation is moderate by color flow Doppler. No evidence of mitral valve stenosis.  6. The tricuspid valve is normal in structure.  7. The aortic valve is tricuspid Aortic valve regurgitation is trivial by color flow Doppler.  8. The pulmonic valve was normal in structure.  9. The aortic root  is normal in size and structure. 10. There is dilatation of the ascending aorta measuring 43 mm. 11. Right atrial pressure is estimated at 3 mmHg. 12. PA systolic pressure 35 mmHg.  Laboratory Data:  Chemistry Recent Labs  Lab 03/10/2019 1742 02/22/2019 2237 02/27/19 0152  NA 134* 133* 134*  K 3.2* 3.8 3.2*  CL 92* 98 97*  CO2 27 21* 22  GLUCOSE 111* 214* 218*  BUN CREATININE 1.42* 1.44* 1.28*  CALCIUM 8.3* 7.3* 7.0*  GFRNONAA 35* 34* 40*  GFRAA 41* 40* 46*  ANIONGAP Recent Labs  Lab 03/10/2019 1742 02/27/19 0152  PROT 7.2 6.2*  ALBUMIN 3.5 2.9*  AST 55* 182*  ALT 35 75*  ALKPHOS 48 52  BILITOT 0.9 1.4*   Hematology Recent Labs  Lab 03/16/2019 2215 02/27/19 0152 02/27/19 0243  WBC 10.7* 8.9 8.3  RBC 3.19* 3.19* 3.09*  HGB 11.1* 11.0* 10.5*  HCT 34.6* 34.5* 33.2*  MCV 108.5* 108.2* 107.4*  MCH 34.8* 34.5* 34.0  MCHC 32.1 31.9 31.6  RDW 14.9 14.9 14.8  PLT 155 137* 132*   Cardiac EnzymesNo results for input(s): TROPONINI in the last 168 hours. No results for input(s): TROPIPOC in the last 168 hours.  BNPNo results for input(s): BNP, PROBNP in the last 168 hours.  DDimer  Recent Labs  Lab 03/09/2019 2224  DDIMER 19.68*    Radiology/Studies:  Ct Head Wo Contrast  Result Date: 02/27/2019 CLINICAL DATA:  Weakness cardiac arrest EXAM: CT HEAD WITHOUT CONTRAST TECHNIQUE: Contiguous axial images were obtained from the base of the skull through the vertex without intravenous contrast. COMPARISON:  None. FINDINGS: Brain: No evidence of acute infarction, hemorrhage,  hydrocephalus, extra-axial collection or mass lesion/mass effect. Mild atrophy. Mild small vessel ischemic changes of the white matter. Vascular: No hyperdense vessels.  Carotid vascular calcification Skull: Normal. Negative for fracture or focal lesion. Sinuses/Orbits: Mild mucosal thickening in the ethmoid sinuses Other: None IMPRESSION: 1. No CT evidence for acute intracranial  abnormality. 2. Atrophy and mild small vessel ischemic changes of the white matter Electronically Signed   By: Jasmine Pang M.D.   On: 02/27/2019 01:16   Ct Angio Chest Pe W Or Wo Contrast  Result Date: 02/27/2019 CLINICAL DATA:  78 year old female positive COVID-19. Respiratory arrest, cardiac arrest. EXAM: CT ANGIOGRAPHY CHEST WITH CONTRAST TECHNIQUE: Multidetector CT imaging of the chest was performed using the standard protocol during bolus administration of intravenous contrast. Multiplanar CT image reconstructions and MIPs were obtained to evaluate the vascular anatomy. CONTRAST:  80mL OMNIPAQUE IOHEXOL 350 MG/ML SOLN COMPARISON:  Portable chest 03-11-19 and earlier. FINDINGS: Cardiovascular: Good contrast bolus timing in the pulmonary arterial tree. Respiratory motion. No central or hilar pulmonary embolus. The proximal lobar branches also appear patent. More distal branch detail degraded by motion. Cardiomegaly. No pericardial effusion. No contrast in the aorta. Calcified aortic atherosclerosis. Calcified coronary artery atherosclerosis. Mediastinum/Nodes: No mediastinal hematoma or lymphadenopathy. Enteric tube courses through the esophagus into the abdomen. Lungs/Pleura: Intubated. ET tube tip terminates above the carina. Atelectatic changes to the airways at the hila. Severe consolidation in the posterior right upper lobe. Similar consolidation in the peripheral left upper lobe, and posterior basal segment of the right lower lobe. Additional bilateral patchy and confluent peribronchial ground-glass and solid opacity in the remaining lobes. Trace if any pleural fluid. Upper Abdomen: Enteric tube continues into the stomach, tip not included. Hepatic steatosis. Negative visible spleen and right adrenal gland. Musculoskeletal: Mildly displaced anterior rib fractures: Bilateral ribs 2, 3, 4, 5 (up to moderately displaced on series 11, image 75), 6, 7, and 8. Additional lateral 6th 7th and 8th right rib  fractures (flail segment) with similar displacement. No sternal fracture identified although detail is degraded by motion. No acute osseous abnormality identified. In the thoracic spine. Review of the MIP images confirms the above findings. IMPRESSION: 1. No central or hilar pulmonary embolus. Distal branches are degraded by motion. 2. Widespread bilateral COVID-19 pneumonia. Trace if any pleural fluid. 3. Widespread bilateral anterior rib fractures. The right ribs 6 through 8 are also fractured laterally (flail segment). 4. Satisfactory position of ET tube and visible enteric tube. 5. Cardiomegaly. Calcified coronary artery atherosclerosis. Aortic Atherosclerosis (ICD10-I70.0). Electronically Signed   By: Odessa Fleming M.D.   On: 02/27/2019 01:42   Dg Chest Port 1 View  Result Date: 11-Mar-2019 CLINICAL DATA:  Status post cardiac arrest.  Hypoxia. EXAM: PORTABLE CHEST 1 VIEW COMPARISON:  03-11-2019 study obtained earlier in the day FINDINGS: Endotracheal tube tip is 3.8 cm above the carina. Nasogastric tube tip and side port are below the diaphragm. No pneumothorax. There is airspace opacity in both upper lobes, more notable on the left than on the right. There is also patchy opacity in the right base. These are new findings compared to earlier in the day. Heart is upper normal in size with pulmonary vascularity normal. No adenopathy. No bone lesions. There is aortic atherosclerosis. IMPRESSION: Tube positions as described without pneumothorax. Multifocal airspace opacity, most notably in the left upper lobe. Question pneumonia versus aspiration. Both entities may be present concurrently. Stable cardiac silhouette. Aortic Atherosclerosis (ICD10-I70.0). Electronically Signed   By: Bretta Bang  III M.D.   On: 23-Mar-2019 20:53   Dg Chest Port 1 View  Result Date: 03-23-2019 CLINICAL DATA:  Shortness of breath and nausea.  COVID-19 positive EXAM: PORTABLE CHEST 1 VIEW COMPARISON:  October 17, 2017. FINDINGS:  There is ill-defined patchy opacity in the right mid lung and right base regions. Subtle patchy opacity in the left perihilar region and left base regions also noted. Heart is upper normal in size with pulmonary vascularity normal. No adenopathy. There is aortic atherosclerosis. No bone lesions. IMPRESSION: Multifocal ill-defined airspace opacity, somewhat more notable on the right, likely multifocal pneumonia. Atypical organism pneumonia suspected. No adenopathy. Heart upper normal in size. Aortic Atherosclerosis (ICD10-I70.0). Electronically Signed   By: Bretta Bang III M.D.   On: 03-23-2019 17:34    Assessment and Plan:   1. Cardiac arrest: patient presented with fever, SOB, and cough. +COVID-19. In the ED she became pulseless and apneic. Not on telemetry at that time so rhythm unknown. CPR initiated. She was intubated and found to have asystole during code. ROSC achieved after ~15 minutes with 3 rounds of epi. Initial EKG on arrival with sinus rhythm with submm STD in V3-6; QTc 516. Repeat EKG (suspect this is post-arrest) with sinus rhythm with new RBBB and q waves in inferior leads. Trop post-arrest [id number]. CTA Chest without PE but significant bilateral opacities c/w COVID-19 PNA, coronary artery calcifications, and aortic atherosclerosis noted. Unclear etiology of cardiac arrest. Trop trend not consistent with ACS, though cannot rule out ischemia.  - Will follow-up echocardiogram - Can consider ischemic evaluation if patient recovers from present illness - No indication for aspirin from a cardiac standpoint as patient is on chronic anticoagulation - Can check HgbA1C and lipid panel for risk stratification, though would not initiate statin if cholesterol elevated in the setting of transaminitis  2. Acute on chronic respiratory failure in the setting of COVID-19: patient presented with fever, SOB, and cough. Documented to be on home O2 in the past, though was not on home O2 just prior to  admission. COVID-19 positive at outside facility 02/25/2019, confirmed this admission. CTA chest with bilateral multifocal opacities c/w COVID-19. Currently intubated following cardiac arrest yesterday. On decadron and remdesivir  - Continue management per primary team  3. Paroxysmal atrial fibrillation: appears to be maintaining sinus rhythm pre/post-arrest. Would not be surprised if patient experiences Afib this admission in the setting of #2. Home eliquis on hold and patient started on a heparin gtt. Home amiodarone and carvedilol held due to NPO status  - Consider amiodarone IV  - Can use prn IV metoprolol for breakthrough Afib - Continue heparin gtt for stroke ppx with plans to resume home eliquis once patient recovers and it is clear no further procedures are necessary.   4. Chronic combined CHF: patient has a history of EF 45-50% 09/2017, felt to be tachycardia mediated in the setting of new onset Afib. EF improved to 55% on subsequent echo 06/2018. She has G2DD and struggles with chronic LE edema, for which she was referred to the lymphedema clinic for possible contributor. Home carvedilol, losartan, and torsemide held as patient is intubated and sedated. Also mild AKI.  - Continue to monitor volume status closely, including strict I&Os and daily weights.  - Diurese as needed - Resume home medications once able to tolerate PO if Cr and BP stable  5. Mitral regurgitation: moderate on echo 06/2018. Recommended for repeat echo 12/2018 but patient cancelled the appointment as she does not tolerate wearing a  mask and preferred to wait. Seen by Dr. Excell Seltzer for MitraClip consideration, though patient preferred to hold off on intervention.  - Will follow-up echocardiogram  6. HTN: BP intermittently elevated this admission. Currently on levophed post arrest. Home carvedilol, losartan, and torsemide on hold - Continue management per primary team       For questions or updates, please contact [facility] Please consult [email or web address] for contact info under Cardiology/STEMI.   Signed, Beatriz Stallion, PA-C  02/27/2019 7:57 AM [phone]

## 2019-02-27 NOTE — Progress Notes (Signed)
Notified Dr Lake Bells of bp 82/51 map 43. See new orders. Continuing to monitor

## 2019-02-27 NOTE — Procedures (Addendum)
Patient Name: Allexus Ovens  MRN: [id number]  Epilepsy Attending: Lora Havens  Referring Physician/Provider: Dr Laurelyn Sickle Date: 02/27/2019 Duration: 24.08 mins  Patient history: 78 year old patient COVID + patient with cardiac arrest.  EEG to evaluate for seizures.  Level of alertness: coma/sedated  AEDs during EEG study: Propofol  Technical aspects: This EEG study was done with scalp electrodes positioned according to the 10-20 International system of electrode placement. Electrical activity was acquired at a sampling rate of 500Hz  and reviewed with a high frequency filter of 70Hz  and a low frequency filter of 1Hz . EEG data were recorded continuously and digitally stored.   DESCRIPTION: EEG showed diffuse suppression as well as intermittent  generalized 2 to 3 Hz delta slowing, maximal bifrontal.  EEG was reactive to tactile stimuli.  Hyperventilation and photic stimulation were not performed.  Abnormality -Background suppression -Intermittent slow, generalized, maximal bifrontal  IMPRESSION: This study is suggestive of profound diffuse encephalopathy, which could be secondary to sedated state, diffuse anoxic/hypoxic injury. No seizures or epileptiform discharges were seen throughout the recording.

## 2019-02-28 ENCOUNTER — Inpatient Hospital Stay (HOSPITAL_COMMUNITY): Payer: HMO

## 2019-02-28 DIAGNOSIS — J690 Pneumonitis due to inhalation of food and vomit: Secondary | ICD-10-CM | POA: Diagnosis present

## 2019-02-28 DIAGNOSIS — I5043 Acute on chronic combined systolic (congestive) and diastolic (congestive) heart failure: Secondary | ICD-10-CM | POA: Diagnosis present

## 2019-02-28 DIAGNOSIS — I420 Dilated cardiomyopathy: Secondary | ICD-10-CM

## 2019-02-28 DIAGNOSIS — U071 COVID-19: Secondary | ICD-10-CM | POA: Diagnosis not present

## 2019-02-28 DIAGNOSIS — I480 Paroxysmal atrial fibrillation: Secondary | ICD-10-CM

## 2019-02-28 DIAGNOSIS — R140 Abdominal distension (gaseous): Secondary | ICD-10-CM | POA: Diagnosis present

## 2019-02-28 DIAGNOSIS — M353 Polymyalgia rheumatica: Secondary | ICD-10-CM

## 2019-02-28 DIAGNOSIS — G9340 Encephalopathy, unspecified: Secondary | ICD-10-CM | POA: Diagnosis present

## 2019-02-28 DIAGNOSIS — I469 Cardiac arrest, cause unspecified: Secondary | ICD-10-CM | POA: Diagnosis present

## 2019-02-28 DIAGNOSIS — J80 Acute respiratory distress syndrome: Secondary | ICD-10-CM | POA: Diagnosis present

## 2019-02-28 DIAGNOSIS — S225XXA Flail chest, initial encounter for closed fracture: Secondary | ICD-10-CM | POA: Diagnosis present

## 2019-02-28 DIAGNOSIS — I77810 Thoracic aortic ectasia: Secondary | ICD-10-CM

## 2019-02-28 DIAGNOSIS — I10 Essential (primary) hypertension: Secondary | ICD-10-CM

## 2019-02-28 LAB — CBC WITH DIFFERENTIAL/PLATELET
Abs Immature Granulocytes: 0.15 10*3/uL — ABNORMAL HIGH (ref 0.00–0.07)
Basophils Absolute: 0 10*3/uL (ref 0.0–0.1)
Basophils Relative: 0 %
Eosinophils Absolute: 0 10*3/uL (ref 0.0–0.5)
Eosinophils Relative: 0 %
HCT: 27.4 % — ABNORMAL LOW (ref 36.0–46.0)
Hemoglobin: 8.7 g/dL — ABNORMAL LOW (ref 12.0–15.0)
Immature Granulocytes: 2 %
Lymphocytes Relative: 9 %
Lymphs Abs: 0.9 10*3/uL (ref 0.7–4.0)
MCH: 33.6 pg (ref 26.0–34.0)
MCHC: 31.8 g/dL (ref 30.0–36.0)
MCV: 105.8 fL — ABNORMAL HIGH (ref 80.0–100.0)
Monocytes Absolute: 0.8 10*3/uL (ref 0.1–1.0)
Monocytes Relative: 8 %
Neutro Abs: 7.9 10*3/uL — ABNORMAL HIGH (ref 1.7–7.7)
Neutrophils Relative %: 81 %
Platelets: 153 10*3/uL (ref 150–400)
RBC: 2.59 MIL/uL — ABNORMAL LOW (ref 3.87–5.11)
RDW: 14.8 % (ref 11.5–15.5)
WBC: 9.9 10*3/uL (ref 4.0–10.5)
nRBC: 0 % (ref 0.0–0.2)

## 2019-02-28 LAB — PROCALCITONIN: Procalcitonin: 65.54 ng/mL

## 2019-02-28 LAB — COMPREHENSIVE METABOLIC PANEL
ALT: 55 U/L — ABNORMAL HIGH (ref 0–44)
AST: 83 U/L — ABNORMAL HIGH (ref 15–41)
Albumin: 2.5 g/dL — ABNORMAL LOW (ref 3.5–5.0)
Alkaline Phosphatase: 40 U/L (ref 38–126)
Anion gap: 15 (ref 5–15)
BUN: 21 mg/dL (ref 8–23)
CO2: 22 mmol/L (ref 22–32)
Calcium: 7.6 mg/dL — ABNORMAL LOW (ref 8.9–10.3)
Chloride: 101 mmol/L (ref 98–111)
Creatinine, Ser: 1.29 mg/dL — ABNORMAL HIGH (ref 0.44–1.00)
GFR calc Af Amer: 46 mL/min — ABNORMAL LOW (ref >60)
GFR calc non Af Amer: 39 mL/min — ABNORMAL LOW (ref >60)
Glucose, Bld: 151 mg/dL — ABNORMAL HIGH (ref 70–99)
Potassium: 2.8 mmol/L — ABNORMAL LOW (ref 3.5–5.1)
Sodium: 138 mmol/L (ref 135–145)
Total Bilirubin: 0.8 mg/dL (ref 0.3–1.2)
Total Protein: 5.3 g/dL — ABNORMAL LOW (ref 6.5–8.1)

## 2019-02-28 LAB — LIPID PANEL
Cholesterol: 134 mg/dL (ref 0–200)
HDL: 35 mg/dL — ABNORMAL LOW (ref >40)
LDL Cholesterol: 65 mg/dL (ref 0–99)
Total CHOL/HDL Ratio: 3.8 RATIO
Triglycerides: 169 mg/dL — ABNORMAL HIGH (ref <150)
VLDL: 34 mg/dL (ref 0–40)

## 2019-02-28 LAB — GLUCOSE, CAPILLARY
Glucose-Capillary: 149 mg/dL — ABNORMAL HIGH (ref 70–99)
Glucose-Capillary: 139 mg/dL — ABNORMAL HIGH (ref 70–99)
Glucose-Capillary: 156 mg/dL — ABNORMAL HIGH (ref 70–99)
Glucose-Capillary: 235 mg/dL — ABNORMAL HIGH (ref 70–99)
Glucose-Capillary: 183 mg/dL — ABNORMAL HIGH (ref 70–99)

## 2019-02-28 LAB — POCT I-STAT 7, (LYTES, BLD GAS, ICA,H+H)
Acid-base deficit: 2 mmol/L (ref 0.0–2.0)
Bicarbonate: 22.1 mmol/L (ref 20.0–28.0)
Calcium, Ion: 1.1 mmol/L — ABNORMAL LOW (ref 1.15–1.40)
HCT: 26 % — ABNORMAL LOW (ref 36.0–46.0)
Hemoglobin: 8.8 g/dL — ABNORMAL LOW (ref 12.0–15.0)
O2 Saturation: 88 %
Patient temperature: 36.7
Potassium: 2.9 mmol/L — ABNORMAL LOW (ref 3.5–5.1)
Sodium: 137 mmol/L (ref 135–145)
TCO2: 23 mmol/L (ref 22–32)
pCO2 arterial: 35.3 mmHg (ref 32.0–48.0)
pH, Arterial: 7.405 (ref 7.350–7.450)
pO2, Arterial: 53 mmHg — ABNORMAL LOW (ref 83.0–108.0)

## 2019-02-28 LAB — HEPARIN LEVEL (UNFRACTIONATED): Heparin Unfractionated: >2.2 IU/mL — ABNORMAL HIGH (ref 0.30–0.70)

## 2019-02-28 LAB — MAGNESIUM: Magnesium: 1.7 mg/dL (ref 1.7–2.4)

## 2019-02-28 LAB — D-DIMER, QUANTITATIVE: D-Dimer, Quant: 2.04 ug/mL-FEU — ABNORMAL HIGH (ref 0.00–0.50)

## 2019-02-28 LAB — ECHOCARDIOGRAM COMPLETE
Height: 64 in
Weight: 4740.77 oz

## 2019-02-28 LAB — APTT
aPTT: >200 seconds (ref 24–36)
aPTT: 147 seconds — ABNORMAL HIGH (ref 24–36)

## 2019-02-28 LAB — C-REACTIVE PROTEIN: CRP: 11.1 mg/dL — ABNORMAL HIGH (ref <1.0)

## 2019-02-28 LAB — PHOSPHORUS: Phosphorus: 2.7 mg/dL (ref 2.5–4.6)

## 2019-02-28 MED ORDER — POTASSIUM PHOSPHATES 15 MMOLE/5ML IV SOLN
30.0000 mmol | Freq: Once | INTRAVENOUS | Status: AC
  Administered 2019-02-28: 30 mmol via INTRAVENOUS
  Filled 2019-02-28: qty 10

## 2019-02-28 MED ORDER — MIDAZOLAM HCL 2 MG/2ML IJ SOLN
1.0000 mg | INTRAMUSCULAR | Status: DC | PRN
  Administered 2019-03-05 – 2019-03-07 (×4): 1 mg via INTRAVENOUS
  Filled 2019-02-28 (×4): qty 2

## 2019-02-28 MED ORDER — ASPIRIN EC 81 MG PO TBEC
81.0000 mg | DELAYED_RELEASE_TABLET | Freq: Every day | ORAL | Status: DC
  Administered 2019-03-01 – 2019-03-02 (×2): 81 mg via ORAL
  Filled 2019-02-28 (×3): qty 1

## 2019-02-28 MED ORDER — MIDAZOLAM HCL 2 MG/2ML IJ SOLN: 1.0000 mg | INTRAMUSCULAR | Status: DC | PRN

## 2019-02-28 MED ORDER — MAGNESIUM SULFATE IN D5W 1-5 GM/100ML-% IV SOLN
1.0000 g | Freq: Once | INTRAVENOUS | Status: AC
  Administered 2019-02-28: 1 g via INTRAVENOUS
  Filled 2019-02-28: qty 100

## 2019-02-28 MED ORDER — FENTANYL 2500MCG IN NS 250ML (10MCG/ML) PREMIX INFUSION
25.0000 ug/h | INTRAVENOUS | Status: DC
  Administered 2019-02-28 (×2): 150 ug/h via INTRAVENOUS
  Administered 2019-03-01 (×2): 175 ug/h via INTRAVENOUS
  Administered 2019-03-02: 92 ug/h via INTRAVENOUS
  Administered 2019-03-03: 100 ug/h via INTRAVENOUS
  Administered 2019-03-04 – 2019-03-05 (×3): 200 ug/h via INTRAVENOUS
  Administered 2019-03-06: 50 ug/h via INTRAVENOUS
  Administered 2019-03-06: 200 ug/h via INTRAVENOUS
  Filled 2019-02-28 (×10): qty 250

## 2019-02-28 MED ORDER — HEPARIN (PORCINE) 25000 UT/250ML-% IV SOLN
900.0000 [IU]/h | INTRAVENOUS | Status: DC
  Administered 2019-02-28: 900 [IU]/h via INTRAVENOUS

## 2019-02-28 MED ORDER — ROCURONIUM BROMIDE 10 MG/ML (PF) SYRINGE
50.0000 mg | PREFILLED_SYRINGE | Freq: Once | INTRAVENOUS | Status: AC
  Administered 2019-02-28: 50 mg via INTRAVENOUS
  Filled 2019-02-28: qty 10

## 2019-02-28 MED ORDER — IPRATROPIUM-ALBUTEROL 20-100 MCG/ACT IN AERS
1.0000 | INHALATION_SPRAY | Freq: Four times a day (QID) | RESPIRATORY_TRACT | Status: DC
  Filled 2019-02-28: qty 4

## 2019-02-28 MED ORDER — HEPARIN (PORCINE) 25000 UT/250ML-% IV SOLN
650.0000 [IU]/h | INTRAVENOUS | Status: DC
  Administered 2019-02-28: 650 [IU]/h via INTRAVENOUS

## 2019-02-28 MED ORDER — VANCOMYCIN HCL IN DEXTROSE 1-5 GM/200ML-% IV SOLN
1000.0000 mg | INTRAVENOUS | Status: DC
  Administered 2019-02-28 – 2019-03-02 (×3): 1000 mg via INTRAVENOUS
  Filled 2019-02-28 (×4): qty 200

## 2019-02-28 MED ORDER — CHLORHEXIDINE GLUCONATE 0.12 % MT SOLN
15.0000 mL | Freq: Two times a day (BID) | OROMUCOSAL | Status: DC
  Administered 2019-02-28 – 2019-03-07 (×15): 15 mL via OROMUCOSAL
  Filled 2019-02-28 (×8): qty 15

## 2019-02-28 MED ORDER — LIDOCAINE 5 % EX PTCH
1.0000 | MEDICATED_PATCH | CUTANEOUS | Status: DC
  Administered 2019-02-28 – 2019-03-07 (×6): 1 via TRANSDERMAL
  Filled 2019-02-28 (×9): qty 1

## 2019-02-28 MED ORDER — VITAL HIGH PROTEIN PO LIQD
1000.0000 mL | ORAL | Status: DC
  Administered 2019-02-28: 22:00:00 1000 mL

## 2019-02-28 MED ORDER — FENTANYL CITRATE (PF) 100 MCG/2ML IJ SOLN: 25.0000 ug | Freq: Once | INTRAMUSCULAR | Status: DC

## 2019-02-28 MED ORDER — POTASSIUM CHLORIDE 10 MEQ/50ML IV SOLN
10.0000 meq | Freq: Once | INTRAVENOUS | Status: AC
  Administered 2019-02-28: 10 meq via INTRAVENOUS
  Filled 2019-02-28: qty 50

## 2019-02-28 MED ORDER — IPRATROPIUM-ALBUTEROL 0.5-2.5 (3) MG/3ML IN SOLN
3.0000 mL | Freq: Four times a day (QID) | RESPIRATORY_TRACT | Status: DC
  Administered 2019-02-28 – 2019-03-01 (×4): 3 mL via RESPIRATORY_TRACT
  Filled 2019-02-28 (×4): qty 3

## 2019-02-28 MED ORDER — FENTANYL BOLUS VIA INFUSION
25.0000 ug | INTRAVENOUS | Status: DC | PRN
  Administered 2019-03-02 – 2019-03-05 (×5): 25 ug via INTRAVENOUS
  Filled 2019-02-28: qty 25

## 2019-02-28 NOTE — Progress Notes (Signed)
Pt VSS on mechaical ventilation and sedation (see eMAR). Pt's husband called and was updated on pt's current status and POC. Husband reminded me that if pt was to Code again, he would like everything done to keep her alive. No additional needs at this time.

## 2019-02-28 NOTE — Progress Notes (Signed)
eLink Physician-Brief Progress Note Patient Name: [name] [name] DOB: Dec 06, 1940 MRN: [id number]   Date of Service  02/28/2019  HPI/Events of Note  Pt with ventilator dyssynchrony with desaturation, she is on a Versed infusion.  eICU Interventions  Versed infusion increased to 1 mg/hr, rocuronium 50 mg iv x 1 given.        Kerry Kass Pietro Bonura 02/28/2019, 2:39 AM

## 2019-02-28 NOTE — Progress Notes (Signed)
Heparin stopped at 0438 as per discussion with pharmacist. We talked about possible contamination of the specimen but ultimately decided on stopping the heparin in lieu of pt's hematuria and bleeding on the mouth and IV sites.

## 2019-02-28 NOTE — Progress Notes (Signed)
Pharmacy Antibiotic Note  [name] [name] is a 78 y.o. female admitted on 02/28/2019 with COVID-19 PNA. Patient was previously on broad-spectrum antibiotics with Levaquin and vancomycin discontinued today with plan to hold unless fever or leukocytosis. Patient remains afebrile and WBC wnl but PCT is elevated at 65 although in setting of AKI. Lab reports GPC in 1/4 aerobic bottle which may represent contamination.  Pharmacy has been consulted for vancomycin dosing.  Plan: Vancomycin loading dose of 2500 mg given 10/14.  Vancomycin 1000 mg IV Q 24 hrs. Goal AUC 400-550. Expected AUC: 498 SCr used: 1.29  F/U culture results, renal function, levels if indicated  Height: 5\' 4"  (162.6 cm) Weight: 296 lb 11.8 oz (134.6 kg) IBW/kg (Calculated) : 54.7  Temp (24hrs), Avg:98.4 F (36.9 C), Min:96.6 F (35.9 C), Max:99.7 F (37.6 C)  Recent Labs  Lab 03/09/2019 1742 03/05/2019 1929 03/03/2019 2215 03/16/2019 2237 02/27/19 0152 02/27/19 0243 02/27/19 1056 02/28/19 0350  WBC 6.5  --  10.7*  --  8.9 8.3  --  9.9  CREATININE 1.42*  --   --  1.44* 1.28*  --  1.43* 1.29*  LATICACIDVEN  --  1.2  --   --   --   --   --   --     Estimated Creatinine Clearance: 48.4 mL/min (A) (by C-G formula based on SCr of 1.29 mg/dL (H)).    Allergies  Allergen Reactions  . Fluoxetine Other (See Comments)    Altered mental status  . Adhesive [Tape] Rash    Cannot tolerate any tape or bandaids more than 24 hrs.  . Demerol [Meperidine Hcl] Rash  . Sulfa Antibiotics Rash    10/14 vanc>>  10/13 Levaquin >> 10/15   10/13 BCx: GPC 1/4 10/13 UCx:   10/14: rare GPC in pairs  10/14 MRSA PCR: negative   Thank you for allowing pharmacy to be a part of this patient's care.  Ulice Dash D 02/28/2019 8:52 PM

## 2019-02-28 NOTE — Progress Notes (Signed)
ANTICOAGULATION CONSULT NOTE - Follow-Up Consult  Pharmacy Consult for Heparin Indication: chest pain/ACS  Allergies  Allergen Reactions  . Fluoxetine Other (See Comments)    Altered mental status  . Adhesive [Tape] Rash    Cannot tolerate any tape or bandaids more than 24 hrs.  . Demerol [Meperidine Hcl] Rash  . Sulfa Antibiotics Rash    Patient Measurements: Height: 5\' 4"  (162.6 cm) Weight: 296 lb 11.8 oz (134.6 kg) IBW/kg (Calculated) : 54.7 Heparin dosing weight: 88kg  Vital Signs: Temp: 98.1 F (36.7 C) (10/15 0600) Temp Source: Bladder (10/15 0600) BP: 112/60 (10/15 0600) Pulse Rate: 64 (10/15 0600)  Labs: Recent Labs    03/08/2019 2024 03/15/2019 2215 02/23/2019 2224 03/15/2019 2237 02/27/19 0152 02/27/19 0243 02/27/19 0245 02/27/19 1056 02/27/19 1800 02/27/19 1825 02/28/19 0130 02/28/19 0349  HGB  --  11.1*  --   --  11.0* 10.5*  --   --   --  9.5*  --  8.8*  HCT  --  34.6*  --   --  34.5* 33.2*  --   --   --  28.0*  --  26.0*  PLT  --  155  --   --  137* 132*  --   --   --   --   --   --   APTT  --  43*  --   --   --   --   --   --  78*  --  >200*  --   LABPROT  --  15.7*  --   --   --   --   --   --   --   --   --   --   INR  --  1.3*  --   --   --   --   --   --   --   --   --   --   HEPARINUNFRC  --   --   --   --   --   --  2.08*  --   --   --   --   --   CREATININE  --   --   --  1.44* 1.28*  --   --  1.43*  --   --   --   --   TROPONINIHS 313*  --  444*  --   --   --   --   --   --   --   --   --     Estimated Creatinine Clearance: 43.7 mL/min (A) (by C-G formula based on SCr of 1.43 mg/dL (H)).   Medical History: Past Medical History:  Diagnosis Date  . Arthritis    "inflammatory" (10/17/2017)  . Ascending aorta dilatation (HCC)    35mm by echo 06/2018  . Female bladder prolapse   . Hypertension   . Mitral regurgitation 12/14/2017   Moderate to severe by TEE/DCCV 09-2017, moderate by echo 06/2018  . Morbid obesity (HCC)   . On home oxygen  therapy    "2L; 24/7" (10/17/2017)  . PAF (paroxysmal atrial fibrillation) (HCC) paf  . Peripheral neuropathy    "BLE" (10/17/2017)  . Polymyalgia rheumatica (HCC)   . Recurrent UTI (urinary tract infection)     Medications:  Infusions:  . sodium chloride 50 mL/hr at 02/28/19 0600  . famotidine (PEPCID) IV Stopped (02/27/19 2230)  . fentaNYL infusion INTRAVENOUS 150 mcg/hr (02/28/19 0600)  . heparin 900 Units/hr (02/28/19 0600)  .  levofloxacin (LEVAQUIN) IV    . midazolam 1 mg/hr (02/28/19 0600)  . norepinephrine (LEVOPHED) Adult infusion Stopped (02/27/19 7793)  . remdesivir 100 mg in NS 250 mL Stopped (02/27/19 2215)  . vancomycin      PTA Eliquis 5mg  po BID- LD unknown, but in past week  Assessment: 78 yo F on Eliquis PTA for hx Afib.  Admit with COVID PNA and PEA arrest.   Chest CT negative for PE.  Troponin elevated.  Currently intubated and unable to take Eliquis.  Pharmacy consulted to start IV heparin for ACS. CBC: Hg low (11.1), Pltc WNL at low end of range (155) Baseline aPTT (43sec) & INR (1.3) slightly above normal range.  Baseline heparin level 2.08- elevated due to Eliquis.    Today, 02/28/19  - aPTT >200, supratherapeutic  - Hgb decreased to 8.8 - Some hematuria and oral bleeding seen per RN  - no line issues per RN  Goal of Therapy:  APTT 66-102 sec Heparin level 0.3-0.7 units/ml Monitor platelets by anticoagulation protocol: Yes   Plan:   Heprain drip held for one hour then decrease heparin infusion to 900 units/hr  Check 8h aPTT- will adjust heparin using aPTT until Eliquis cleared/heparin levels correlate to aPTT  Daily heparin level & CBC while on heparin   Royetta Asal, PharmD, BCPS 02/28/2019 6:06 AM

## 2019-02-28 NOTE — Progress Notes (Signed)
Brief cardiology update note:  I read Ms. Flott's echo. Her EF is now reduced from 55% in 06/2018 to 25-30%. There is also grade 2 diastolic dysfunction. Her RV is also enlarged with reduced function. PA pressures at least mildly elevated (at least 50mmHg) if not more, RA pressure difficult to determine on vent.  This was discussed with Dr. Sherral Hammers. With asystole arrest, I suspect this was a respiratory driven cardiac arrest. There has not been evidence of ST elevation.   Given her critical illness, we would not urgently take her to the cath lab except for STEMI. Without evidence of this, we recommend continued excellent management of her Covid infection and critical illness. Ideally would use beta blocker and ACEi/ARB, but with her critical illness (including intermittent hypotension and bradycardia) she cannot receive these at this time.  She is critically ill, and upon recovery we would be happy to reassess her new systolic dysfunction. If her clinical condition further deteriorates, please contact the cardiology service and we can reassess. However, I suspect due to her multiple comorbidities she may not benefit from advanced cardiovascular support such as inotropes.  We will sign off for now but would be happy to be reinvolved if her clinical scenario changes.  Buford Dresser, MD, PhD [facility]  [address], [place], [place] [address] [phone]

## 2019-02-28 NOTE — Consult Note (Signed)
   [facility] Inpatient Consult   02/28/2019  [name] [name] [birth year] [id number]   [facility] Status:  HTA CSNP active  Alert of admission, patient is active with the HTA CSNP and with a Chronic Care Coordinator who is aware of hospitalization.  Plan:  Follow for progress and disposition needs when appropriate with inpatient [facility] team assessment.  Natividad Brood, RN BSN [facility] Liaison  [phone] business mobile phone Toll free office [phone]  Fax number: [phone] [email or web address]

## 2019-02-28 NOTE — Progress Notes (Addendum)
PROGRESS NOTE    [name] [name]  RUE:[id number] DOB: [birth year] DOA: 03/09/2019 PCP: Leanora Ivanoff., MD   Brief Narrative:  78 year old WF PMHx ascending aorta dilation, Chronic Systolic and Diastolic CHF, dilated cardiomyopathy, essential HTN, paroxysmal atrial fibrillation, mitral valve regurgitation moderate, polymyalgia rheumatica, chronic respiratory failure with hypoxia on 2 L O2 via [place] at home.  diagnosed as being Covid positive 10/12 presented with increased cough shortness of breath & hypoxia requiring 12L O2 , while being initially evaluated in the emergency room patient had a pulseless arrest.  Rhythm was asystole . Patient responded 3 doses of epinephrine during CPR with return of rhythm and blood pressure   Subjective: 10/15 sedated/intubated.  Unresponsive to painful stimuli   Assessment & Plan:   Active Problems:   PAF (paroxysmal atrial fibrillation) (HCC)   HTN (hypertension)   DCM (dilated cardiomyopathy) (HCC)   IFG (impaired fasting glucose)   Morbid obesity (HCC)   Mitral regurgitation   Polymyalgia rheumatica (HCC)   Ascending aorta dilatation (HCC)   Respiratory arrest (HCC)   Acute on chronic respiratory failure with hypoxia (HCC)   Pneumonia due to COVID-19 virus   Acute on chronic combined systolic and diastolic CHF (congestive heart failure) (HCC)   Acute respiratory distress syndrome (ARDS) due to COVID-19 virus (HCC)   Aspiration pneumonia of both lower lobes due to gastric secretions (HCC)   Flail chest   Cardiac arrest (HCC)   Acute encephalopathy   Abdominal distention  Patient evaluated by PCCM today plan below.  Covid 19 pneumonia/Acute on Chronic respiratory failure with hypoxia -Decadron 8 mg BID x 10 days -Remdesivir per pharmacy protocol -Combivent QID   ARDS -This a.m. patient appears much more comfortable on vent -Lung protective strategy -Currently no need for paralytic -10/15 Dr. Heber Kearney PCCM to discuss goals of  care with husband.If decided aggressive care to continue, prone patient 16 hours/day with 8-hour in supine position..  Will be difficult given patient's flail chest but should attempt.  Aspiration pneumonia- -Multiple admission chest x-ray arrest episodes -10/15 Tracheal aspirate pending   -Patient without leukocytosis, afebrile discontinue empiric antibiotics.  If patient spikes fever or positive leukocytosis will restart antibiotics.  Flail chest -H& P reported flail chest, all review of PCXR unable to appreciate any fractured ribs and not mentioned by radiologist. -If present most likely secondary to CPR when patient coded -If present will increase weaning difficulty. -If present will require significant pain control/paralytics to eliminate vent dyssynchrony especially if patient is prone. -10/15 repeat PCXR; evaluate for flail chest.  Status post Asystolic Arrest.   -Etiology of this is unclear.  May have been related to extreme hypoxia -10/14 echocardiogram shows worsening heart function see results below.    Acute on chronic systolic and diastolic CHF -Significantly reduced EF.  EF = 55% from echocardiogram 06/29/2018.  EF now 25 to 30%-also on previous echocardiogram mitral regurgitation read as moderate, now read as trace regurgitation.  -Patient cardiologist Dr. Armanda Magic.  Will consult cardiology, may need ICD placement once stable  Paroxysmal atrial fibrillation -Was on Eliquis (hold) while hospitalized. -Heparin IV.  Bacteremia/positive GPC -10/15 given patient's poor prognosis will restart empiric vancomycin until cultures speciate  Acute encephalopathy -Multifactorial, anoxic vs Covid 19 infection vs infection vs sedation. -Treat underlying cause  -EEG shows no seizure activity.  However shows diffuse encephalopathy see results below.- -Have been recommended by admitting physician use [place] for cooling protocol, however patient outside window upon admission to  [facility]. -  We will attempt to wean patient from sedating medication, of note patient had significant vent dyssynchrony on 10/14.  Abdominal distention -KUB negative for obstruction.  See results below -OG tube to low intermittent suction overnight, did not decompress patient.  This may be patient's body normal habitus. -Begin trickle feeds   Hx chronic polymyalgia rheumatica -On chronic prednisone 5 to 20 mg daily. -Currently on Decadron for Covid pneumonia which will cover her chronic steroid use.     DVT prophylaxis: Heparin Code Status: Full Family Communication: 10/15 spoke with husband and informed him his wife was gravely ill and may not survive this hospitalization.  Counseled him on plan of care.  Answered all questions.  Counseling on that if her heart was to stop performing CPR would not change the outcome.  Agreed no CPR or defibrillation Disposition Plan: TBD   Consultants:  PCCM    Procedures/Significant Events:  Head CT 10/13 >> neg CT angio chest 10/13 >> neg PE, consolidation bilateral, with patchy infiltrates,  mildly displaced anterior rib fractures: Bilateral ribs 2, 3, 4, 5 (up to moderately displaced on series 11, image 75), 6, 7, and 8. Additional lateral 6th 7th and 8th right rib fractures (flail segment) with similar displacement 10/14 echocardiogram;Left Ventricle: EF= 25 to 30%. -The left ventricle has severely decreased function.  -Left ventricular diastolic Doppler parameters are consistent with pseudonormalization pattern of LV diastolic filling. Diffuse hypokinesis. Right Ventricle:  moderately enlarged. No increase in right ventricular wall thickness.  -Global RV systolic function is has moderately reduced systolic function. The tricuspid regurgitant velocity is 2.43 m/s, and with an assumed right atrial pressure of 8 mmHg, the estimated right ventricular systolic pressure is mildly elevated at 31.6 mmHg. Mitral Valve: The mitral valve is normal in  structure. Trace mitral valve regurgitation. 10/14 EEG-;suggestive of profound diffuse encephalopathy, which could be secondary to sedated state, diffuse anoxic/hypoxic injury.  -No seizures or epileptiform discharges were seen  10/14 KUB; nonobstructive bowel gas pattern 10/14 PCXR;-mild cardiomegaly. -Fairly extensive bilateral interstitial and ground-glass opacities in addition to multifocal consolidations, interval worsening at the right upper lobe and bilateral lower lobes.  -No pneumothorax.   I have personally reviewed and interpreted all radiology studies and my findings are as above.  VENTILATOR SETTINGS: Vent mode; PRVC Vt Set; 430 FiO2; 50% PEEP; 18    Cultures 10/13 urine pending 10/13 blood LEFT hand NGTD 10/13 blood LEFT wrist positive GPC 10/14 tracheal aspirate pending 10/14 MRSA PCR negative      Antimicrobials: Anti-infectives (From admission, onward)   Start     Stop   02/28/19 2200  vancomycin (VANCOCIN) 1,500 mg in sodium chloride 0.9 % 500 mL IVPB  Status:  Discontinued     02/28/19 1144   02/28/19 1000  levofloxacin (LEVAQUIN) IVPB 750 mg  Status:  Discontinued     02/28/19 1144   02/27/19 2200  remdesivir 100 mg in sodium chloride 0.9 % 250 mL IVPB     03/03/19 1559   02/27/19 1000  vancomycin (VANCOCIN) 2,500 mg in sodium chloride 0.9 % 500 mL IVPB     02/27/19 1416   02/27/19 0930  levofloxacin (LEVAQUIN) IVPB 500 mg  Status:  Discontinued     02/27/19 0932   02/27/19 0000  remdesivir 200 mg in sodium chloride 0.9 % 250 mL IVPB     02/27/19 0039   06-Mar-2019 1915  levofloxacin (LEVAQUIN) IVPB 500 mg     03/06/2019 2138   Mar 06, 2019 1900  cefTRIAXone (ROCEPHIN)  1 g in sodium chloride 0.9 % 100 mL IVPB  Status:  Discontinued     03-24-2019 1902       Devices   LINES / TUBES:      Continuous Infusions: . sodium chloride 50 mL/hr at 02/28/19 1200  . famotidine (PEPCID) IV Stopped (02/27/19 2230)  . fentaNYL infusion INTRAVENOUS 150 mcg/hr  (02/28/19 1246)  . heparin 900 Units/hr (02/28/19 1200)  . norepinephrine (LEVOPHED) Adult infusion Stopped (02/27/19 1245)  . potassium chloride    . potassium PHOSPHATE IVPB (in mmol) 30 mmol (02/28/19 1201)  . remdesivir 100 mg in NS 250 mL Stopped (02/27/19 2215)     Objective: Vitals:   02/28/19 1144 02/28/19 1200 02/28/19 1300 02/28/19 1411  BP: 120/61 (!) 167/76 119/65   Pulse: 76 85 73   Resp: (!) 27 (!) 23 (!) 28   Temp: 99 F (37.2 C) 99.1 F (37.3 C) 99 F (37.2 C)   TempSrc:  Bladder    SpO2: 100% 97% 98% 100%  Weight:      Height:        Intake/Output Summary (Last 24 hours) at 02/28/2019 1451 Last data filed at 02/28/2019 1200 Gross per 24 hour  Intake 1957.74 ml  Output 1325 ml  Net 632.74 ml   Filed Weights   02/27/19 0134 02/28/19 0425  Weight: 134.4 kg 134.6 kg   Physical Exam:  General: Sedated/intubated, acute on chronic respiratory distress Eyes: negative scleral hemorrhage, negative anisocoria, negative icterus ENT: Negative Runny nose, negative gingival bleeding, #7.5 ETT tube in place bloody discharge around tube.  Secondary to what appears to be laceration over tongue and possibly mucosal of lips. Neck:  Negative scars, masses, torticollis, lymphadenopathy, JVD Lungs: Tachypneic clear to auscultation bilaterally without wheezes or crackles, pressure on sternum of chest possible mild movement consistent with flail chest, patient grimaces in pain. Cardiovascular: Regular rate and rhythm without murmur gallop or rub normal S1 and S2 Abdomen: negative abdominal pain, positive distention, negative soft, bowel sounds, no rebound, no ascites, no appreciable mass Extremities: No significant cyanosis, clubbing, or edema bilateral lower extremities Skin: Negative rashes, lesions, ulcers Psychiatric: Sedated/intubated Central nervous system: Patient sedated/intubated did not respond to painful stimuli except for mild grimacing when palpation of sternum   .     Data Reviewed: Care during the described time interval was provided by me .  I have reviewed this patient's available data, including medical history, events of note, physical examination, and all test results as part of my evaluation.   CBC: Recent Labs  Lab 03-24-19 1742 03-24-19 2215 02/27/19 0152 02/27/19 0243 02/27/19 1825 02/28/19 0349 02/28/19 0350  WBC 6.5 10.7* 8.9 8.3  --   --  9.9  NEUTROABS 4.4  --   --   --   --   --  7.9*  HGB 11.7* 11.1* 11.0* 10.5* 9.5* 8.8* 8.7*  HCT 35.4* 34.6* 34.5* 33.2* 28.0* 26.0* 27.4*  MCV 104.7* 108.5* 108.2* 107.4*  --   --  105.8*  PLT 145* 155 137* 132*  --   --  809   Basic Metabolic Panel: Recent Labs  Lab 03-24-19 1742 03-24-2019 2237 02/27/19 0142 02/27/19 0152 02/27/19 1056 02/27/19 1825 02/28/19 0349 02/28/19 0350  NA 134* 133*  --  134* 134* 136 137 138  K 3.2* 3.8  --  3.2* 5.4* 3.1* 2.9* 2.8*  CL 92* 98  --  97* 100  --   --  101  CO2 27 21*  --  22 20*  --   --  22  GLUCOSE 111* 214*  --  218* 149*  --   --  151*  BUN 17 18  --  18 21  --   --  21  CREATININE 1.42* 1.44*  --  1.28* 1.43*  --   --  1.29*  CALCIUM 8.3* 7.3*  --  7.0* 7.6*  --   --  7.6*  MG  --   --  1.7  --   --   --   --  1.7  PHOS  --   --   --   --   --   --   --  2.7   GFR: Estimated Creatinine Clearance: 48.4 mL/min (A) (by C-G formula based on SCr of 1.29 mg/dL (H)). Liver Function Tests: Recent Labs  Lab 11-Mar-2019 1742 02/27/19 0152 02/28/19 0350  AST 55* 182* 83*  ALT 35 75* 55*  ALKPHOS 48 52 40  BILITOT 0.9 1.4* 0.8  PROT 7.2 6.2* 5.3*  ALBUMIN 3.5 2.9* 2.5*   No results for input(s): LIPASE, AMYLASE in the last 168 hours. No results for input(s): AMMONIA in the last 168 hours. Coagulation Profile: Recent Labs  Lab 2019-03-11 2215  INR 1.3*   Cardiac Enzymes: No results for input(s): CKTOTAL, CKMB, CKMBINDEX, TROPONINI in the last 168 hours. BNP (last 3 results) No results for input(s): PROBNP in the last 8760  hours. HbA1C: Recent Labs    02/27/19 0243  HGBA1C 6.0*   CBG: Recent Labs  Lab 02/27/19 2013 02/27/19 2330 02/28/19 0413 02/28/19 0748 02/28/19 1157  GLUCAP 135* 140* 149* 139* 156*   Lipid Profile: Recent Labs    02/27/19 0152 02/28/19 0350  CHOL  --  134  HDL  --  35*  LDLCALC  --  65  TRIG 174* 169*  CHOLHDL  --  3.8   Thyroid Function Tests: No results for input(s): TSH, T4TOTAL, FREET4, T3FREE, THYROIDAB in the last 72 hours. Anemia Panel: Recent Labs    03/11/2019 2217  FERRITIN 3,928*   Urine analysis:    Component Value Date/Time   COLORURINE YELLOW 03/11/19 1752   APPEARANCEUR HAZY (A) 2019/03/11 1752   LABSPEC 1.013 03-11-19 1752   PHURINE 6.0 03/11/19 1752   GLUCOSEU NEGATIVE 11-Mar-2019 1752   HGBUR MODERATE (A) 03/11/19 1752   BILIRUBINUR NEGATIVE 2019-03-11 1752   KETONESUR 5 (A) 11-Mar-2019 1752   PROTEINUR 100 (A) 2019-03-11 1752   UROBILINOGEN 0.2 10/27/2006 1337   NITRITE NEGATIVE 03/11/19 1752   LEUKOCYTESUR LARGE (A) 03-11-19 1752   Sepsis Labs: @LABRCNTIP (procalcitonin:4,lacticidven:4)  ) Recent Results (from the past 240 hour(s))  SARS CORONAVIRUS 2 (TAT 6-24 HRS) Nasopharyngeal Nasopharyngeal Swab     Status: Abnormal   Collection Time: 03/11/2019  5:42 PM   Specimen: Nasopharyngeal Swab  Result Value Ref Range Status   SARS Coronavirus 2 POSITIVE (A) NEGATIVE Final    Comment: RESULT CALLED TO, READ BACK BY AND VERIFIED WITH: 02/28/19, RN AT [phone] ON 02/27/2019 BY SAINVILUS S (NOTE) SARS-CoV-2 target nucleic acids are DETECTED. The SARS-CoV-2 RNA is generally detectable in upper and lower respiratory specimens during the acute phase of infection. Positive results are indicative of active infection with SARS-CoV-2. Clinical  correlation with patient history and other diagnostic information is necessary to determine patient infection status. Positive results do  not rule out bacterial infection or co-infection with  other viruses. The expected result is Negative. Fact Sheet for Patients: 03/01/2019 Fact Sheet  for Healthcare Providers: quierodirigir.com This test is not yet approved or cleared by the [place] and  has been authorized for detection and/or diagnosis of SARS-CoV-2 by FDA under an Emergency Use Authorization (EUA). This EUA will remain  in effect (meaning this test can b e used) for the duration of the COVID-19 declaration under Section 564(b)(1) of the Act, 21 U.S.C. section 360bbb-3(b)(1), unless the authorization is terminated or revoked sooner. Performed at [facility], [address], [place], [place] [address]   Urine Culture     Status: None (Preliminary result)   Collection Time: 02/21/2019  5:52 PM   Specimen: Urine, Clean Catch  Result Value Ref Range Status   Specimen Description   Final    URINE, CLEAN CATCH Performed at [facility], [address], [place], [place] [address]    Special Requests   Final    NONE Performed at [facility], [address] [place], [place] [address]    Culture   Final    CULTURE REINCUBATED FOR BETTER GROWTH Performed at [facility], [address], [place], [place] [address]    Report Status PENDING  Incomplete  Blood culture (routine x 2)     Status: None (Preliminary result)   Collection Time: 02/16/2019  9:48 PM   Specimen: BLOOD  Result Value Ref Range Status   Specimen Description   Final    BLOOD BLOOD LEFT HAND Performed at [facility], [address], [place], [place] [address]    Special Requests   Final    BOTTLES DRAWN AEROBIC AND ANAEROBIC Blood Culture adequate volume Performed at [facility], [address], [place], [place] [address]    Culture   Final    NO GROWTH 2 DAYS Performed at [facility], [address], [place], [place] [address]    Report  Status PENDING  Incomplete  Blood culture (routine x 2)     Status: None (Preliminary result)   Collection Time: 02/25/2019  9:48 PM   Specimen: BLOOD  Result Value Ref Range Status   Specimen Description   Final    BLOOD BLOOD LEFT WRIST Performed at [facility], [address] [place], [place] [address]    Special Requests   Final    BOTTLES DRAWN AEROBIC AND ANAEROBIC Blood Culture adequate volume Performed at [facility], [address] [place], [place] [address]    Culture   Final    NO GROWTH 2 DAYS Performed at [facility], [address] [place], [place] [address]    Report Status PENDING  Incomplete  Culture, respiratory (non-expectorated)     Status: None (Preliminary result)   Collection Time: 02/27/19 11:30 AM   Specimen: Tracheal Aspirate; Respiratory  Result Value Ref Range Status   Specimen Description   Final    TRACHEAL ASPIRATE Performed at [facility], [address], [place], [place] [address]    Special Requests   Final    NONE Performed at [facility], [address], [place], [place] [address]    Gram Stain   Final    RARE WBC PRESENT,BOTH PMN AND MONONUCLEAR RARE GRAM POSITIVE COCCI IN PAIRS    Culture   Final    CULTURE REINCUBATED FOR BETTER GROWTH Performed at [facility], [address], [place], [place] [address]    Report Status PENDING  Incomplete  MRSA PCR Screening     Status: None   Collection Time: 02/27/19  5:20 PM   Specimen: Nasal Mucosa; Nasopharyngeal  Result Value Ref Range Status   MRSA by PCR NEGATIVE NEGATIVE Final    Comment:        The GeneXpert MRSA Assay (FDA approved for NASAL specimens only), is one component of a comprehensive MRSA colonization surveillance program. It is not intended to diagnose MRSA infection nor to guide or monitor treatment for MRSA infections. Performed at [facility], [address], [place], [place] [address]          Radiology Studies: Dg Abd 1 View  Result Date: 02/27/2019 CLINICAL DATA:  NG tube placement EXAM: ABDOMEN - 1 VIEW COMPARISON:  None. FINDINGS: NG tube tip and side port are in the proximal stomach. Nonobstructive bowel gas pattern. IMPRESSION: NG tube tip in the proximal stomach. Electronically Signed   By: Charlett Nose M.D.   On: 02/27/2019 12:28   Ct Head Wo Contrast  Result Date: 02/27/2019 CLINICAL DATA:  Weakness cardiac arrest EXAM: CT HEAD WITHOUT CONTRAST TECHNIQUE: Contiguous axial images were obtained from the base of the skull through the vertex without intravenous contrast. COMPARISON:  None. FINDINGS: Brain: No evidence of acute infarction, hemorrhage, hydrocephalus, extra-axial collection or mass lesion/mass effect. Mild atrophy. Mild small vessel ischemic changes of the white matter. Vascular: No hyperdense vessels.  Carotid vascular calcification Skull: Normal. Negative for fracture or focal lesion. Sinuses/Orbits: Mild mucosal thickening in the ethmoid sinuses Other: None IMPRESSION: 1. No CT evidence for acute intracranial abnormality. 2. Atrophy and mild small vessel ischemic changes of the white matter Electronically Signed   By: Jasmine Pang M.D.   On: 02/27/2019 01:16   Ct Angio Chest Pe W Or Wo Contrast  Result Date: 02/27/2019 CLINICAL DATA:  78 year old female positive COVID-19. Respiratory arrest, cardiac arrest. EXAM: CT ANGIOGRAPHY CHEST WITH CONTRAST TECHNIQUE: Multidetector CT imaging of the chest was performed using the standard protocol during bolus administration of intravenous contrast. Multiplanar CT image reconstructions and MIPs were obtained to evaluate the vascular anatomy. CONTRAST:  80mL OMNIPAQUE IOHEXOL 350 MG/ML SOLN COMPARISON:  Portable chest Mar 05, 2019 and earlier. FINDINGS: Cardiovascular: Good contrast bolus timing in the pulmonary arterial tree. Respiratory motion. No central or hilar pulmonary  embolus. The proximal lobar branches also appear patent. More distal branch detail degraded by motion. Cardiomegaly. No pericardial effusion. No contrast in the aorta. Calcified aortic atherosclerosis. Calcified coronary artery atherosclerosis. Mediastinum/Nodes: No mediastinal hematoma or lymphadenopathy. Enteric tube courses through the esophagus into the abdomen. Lungs/Pleura: Intubated. ET tube tip terminates above the carina. Atelectatic changes to the airways at the hila. Severe consolidation in the posterior right upper lobe. Similar consolidation in the peripheral left upper lobe, and posterior basal segment of the right lower lobe. Additional bilateral patchy and confluent peribronchial ground-glass and solid opacity in the remaining lobes. Trace if any pleural fluid. Upper Abdomen: Enteric tube continues into the stomach, tip not included. Hepatic steatosis. Negative visible spleen and right adrenal gland. Musculoskeletal: Mildly displaced anterior rib fractures: Bilateral ribs 2, 3, 4, 5 (up to moderately displaced on series 11, image 75), 6, 7, and 8. Additional lateral 6th 7th and 8th right rib fractures (flail segment) with similar displacement. No sternal fracture identified although detail is degraded by motion. No acute osseous abnormality identified. In the thoracic spine. Review of the MIP images confirms the above findings. IMPRESSION: 1. No central or hilar pulmonary embolus.  Distal branches are degraded by motion. 2. Widespread bilateral COVID-19 pneumonia. Trace if any pleural fluid. 3. Widespread bilateral anterior rib fractures. The right ribs 6 through 8 are also fractured laterally (flail segment). 4. Satisfactory position of ET tube and visible enteric tube. 5. Cardiomegaly. Calcified coronary artery atherosclerosis. Aortic Atherosclerosis (ICD10-I70.0). Electronically Signed   By: Odessa FlemingH  Hall M.D.   On: 02/27/2019 01:42   Dg Chest Port 1 View  Result Date: 02/27/2019 CLINICAL DATA:   Acute respiratory failure EXAM: PORTABLE CHEST 1 VIEW COMPARISON:  14-May-2019, 10/17/2017 FINDINGS: Endotracheal tube tip about 2.8 cm superior to the carina. Esophageal tube tip is below the diaphragm. Right upper extremity catheter tip overlies the brachiocephalic region. Mild cardiomegaly. Fairly extensive bilateral interstitial and ground-glass opacities in addition to multifocal consolidations, interval worsening at the right upper lobe and bilateral lower lobes. Aortic atherosclerosis. No pneumothorax. IMPRESSION: 1. Support lines and tubes as above. New right upper extremity central venous catheter with tip superimposing the right brachiocephalic region 2. Interval worsening of bilateral right greater than left pneumonia 3. Cardiomegaly Electronically Signed   By: Jasmine PangKim  Fujinaga M.D.   On: 02/27/2019 19:38   Dg Chest Port 1 View  Result Date: 14-May-2019 CLINICAL DATA:  Status post cardiac arrest.  Hypoxia. EXAM: PORTABLE CHEST 1 VIEW COMPARISON:  February 26, 2019 study obtained earlier in the day FINDINGS: Endotracheal tube tip is 3.8 cm above the carina. Nasogastric tube tip and side port are below the diaphragm. No pneumothorax. There is airspace opacity in both upper lobes, more notable on the left than on the right. There is also patchy opacity in the right base. These are new findings compared to earlier in the day. Heart is upper normal in size with pulmonary vascularity normal. No adenopathy. No bone lesions. There is aortic atherosclerosis. IMPRESSION: Tube positions as described without pneumothorax. Multifocal airspace opacity, most notably in the left upper lobe. Question pneumonia versus aspiration. Both entities may be present concurrently. Stable cardiac silhouette. Aortic Atherosclerosis (ICD10-I70.0). Electronically Signed   By: Bretta BangWilliam  Woodruff III M.D.   On: 14-May-2019 20:53   Dg Chest Port 1 View  Result Date: 14-May-2019 CLINICAL DATA:  Shortness of breath and nausea.  COVID-19  positive EXAM: PORTABLE CHEST 1 VIEW COMPARISON:  October 17, 2017. FINDINGS: There is ill-defined patchy opacity in the right mid lung and right base regions. Subtle patchy opacity in the left perihilar region and left base regions also noted. Heart is upper normal in size with pulmonary vascularity normal. No adenopathy. There is aortic atherosclerosis. No bone lesions. IMPRESSION: Multifocal ill-defined airspace opacity, somewhat more notable on the right, likely multifocal pneumonia. Atypical organism pneumonia suspected. No adenopathy. Heart upper normal in size. Aortic Atherosclerosis (ICD10-I70.0). Electronically Signed   By: Bretta BangWilliam  Woodruff III M.D.   On: 14-May-2019 17:34   [place] Ekg Site Rite  Result Date: 02/27/2019 If Site Rite image not attached, placement could not be confirmed due to current cardiac rhythm.       Scheduled Meds: . [START ON 03/01/2019] aspirin EC  81 mg Oral Daily  . chlorhexidine  15 mL Mouth/Throat BID  . Chlorhexidine Gluconate Cloth  6 each Topical Daily  . Chlorhexidine Gluconate Cloth  6 each Topical Q0600  . dexamethasone (DECADRON) injection  8 mg Intravenous Q12H  . fentaNYL (SUBLIMAZE) injection  25 mcg Intravenous Once  . insulin aspart  0-9 Units Subcutaneous Q4H  . ipratropium-albuterol  3 mL Nebulization Q6H  . lidocaine  1 patch Transdermal  Q24H  . mouth rinse  15 mL Mouth Rinse 10 times per day  . mupirocin ointment  1 application Nasal BID  . sodium chloride flush  10-40 mL Intracatheter Q12H   Continuous Infusions: . sodium chloride 50 mL/hr at 02/28/19 1200  . famotidine (PEPCID) IV Stopped (02/27/19 2230)  . fentaNYL infusion INTRAVENOUS 150 mcg/hr (02/28/19 1246)  . heparin 900 Units/hr (02/28/19 1200)  . norepinephrine (LEVOPHED) Adult infusion Stopped (02/27/19 1610)  . potassium chloride    . potassium PHOSPHATE IVPB (in mmol) 30 mmol (02/28/19 1201)  . remdesivir 100 mg in NS 250 mL Stopped (02/27/19 2215)     LOS: 2 days   The  patient is critically ill with multiple organ systems failure and requires high complexity decision making for assessment and support, frequent evaluation and titration of therapies, application of advanced monitoring technologies and extensive interpretation of multiple databases. Critical Care Time devoted to patient care services described in this note  Time spent: 40 minutes     , Roselind Messier, MD [facility] Pager [phone]  If 7PM-7AM, please contact night-coverage [email or web address] Password [email or web address] 02/28/2019, 2:51 PM

## 2019-02-28 NOTE — Progress Notes (Signed)
ANTICOAGULATION CONSULT NOTE - Follow-Up Consult  Pharmacy Consult for Heparin Indication: chest pain/ACS  Allergies  Allergen Reactions  . Fluoxetine Other (See Comments)    Altered mental status  . Adhesive [Tape] Rash    Cannot tolerate any tape or bandaids more than 24 hrs.  . Demerol [Meperidine Hcl] Rash  . Sulfa Antibiotics Rash    Patient Measurements: Height: 5\' 4"  (162.6 cm) Weight: 296 lb 11.8 oz (134.6 kg) IBW/kg (Calculated) : 54.7 Heparin dosing weight: 88kg  Vital Signs: Temp: 99.1 F (37.3 C) (10/15 1500) Temp Source: Bladder (10/15 1200) BP: 115/62 (10/15 1500) Pulse Rate: 79 (10/15 1500)  Labs: Recent Labs    02/18/2019 2024  02/24/2019 2215 02/20/2019 2224  02/27/19 0152 02/27/19 0243 02/27/19 0245 02/27/19 1056 02/27/19 1800 02/27/19 1825 02/28/19 0130 02/28/19 0349 02/28/19 0350 02/28/19 1350  HGB  --   --  11.1*  --   --  11.0* 10.5*  --   --   --  9.5*  --  8.8* 8.7*  --   HCT  --   --  34.6*  --   --  34.5* 33.2*  --   --   --  28.0*  --  26.0* 27.4*  --   PLT  --   --  155  --   --  137* 132*  --   --   --   --   --   --  153  --   APTT  --    < > 43*  --   --   --   --   --   --  78*  --  >200*  --   --  147*  LABPROT  --   --  15.7*  --   --   --   --   --   --   --   --   --   --   --   --   INR  --   --  1.3*  --   --   --   --   --   --   --   --   --   --   --   --   HEPARINUNFRC  --   --   --   --   --   --   --  2.08*  --   --   --   --   --  >2.20*  --   CREATININE  --   --   --   --    < > 1.28*  --   --  1.43*  --   --   --   --  1.29*  --   TROPONINIHS 313*  --   --  444*  --   --   --   --   --   --   --   --   --   --   --    < > = values in this interval not displayed.    Estimated Creatinine Clearance: 48.4 mL/min (A) (by C-G formula based on SCr of 1.29 mg/dL (H)).   Medical History: Past Medical History:  Diagnosis Date  . Arthritis    "inflammatory" (10/17/2017)  . Ascending aorta dilatation (HCC)    34mm by echo  06/2018  . Female bladder prolapse   . Hypertension   . Mitral regurgitation 12/14/2017   Moderate to severe by TEE/DCCV 09-2017, moderate by echo 06/2018  .  Morbid obesity ([place])   . On home oxygen therapy    "2L; 24/7" (10/17/2017)  . PAF (paroxysmal atrial fibrillation) (HCC) paf  . Peripheral neuropathy    "BLE" (10/17/2017)  . Polymyalgia rheumatica ([place])   . Recurrent UTI (urinary tract infection)     Medications:  Infusions:  . sodium chloride Stopped (02/28/19 1201)  . famotidine (PEPCID) IV Stopped (02/27/19 2230)  . fentaNYL infusion INTRAVENOUS 150 mcg/hr (02/28/19 1702)  . heparin 900 Units/hr (02/28/19 1400)  . norepinephrine (LEVOPHED) Adult infusion Stopped (02/27/19 5701)  . potassium chloride    . potassium PHOSPHATE IVPB (in mmol) 85 mL/hr at 02/28/19 1400  . remdesivir 100 mg in NS 250 mL 100 mg (02/28/19 1544)    PTA Eliquis 5mg  po BID- LD unknown, but in past week  Assessment: 78 yo F on Eliquis PTA for hx Afib.  Admit with COVID PNA and PEA arrest.   Chest CT negative for PE.  Troponin elevated.  Currently intubated and unable to take Eliquis.  Pharmacy consulted to start IV heparin for ACS. CBC: Hg low (11.1), Pltc WNL at low end of range (155) Baseline aPTT (43sec) & INR (1.3) slightly above normal range.  Baseline heparin level 2.08- elevated due to Eliquis.    02/28/19  - aPTT 147, remains supratherapeutic  - Hgb decreased to 8.7 - Some hematuria and oral bleeding seen per RN  - no line issues per RN - RN unsure where lab drew sample from  Goal of Therapy:  APTT 66-102 sec Heparin level 0.3-0.7 units/ml Monitor platelets by anticoagulation protocol: Yes   Plan:   Heprain drip held for one hour then decrease heparin infusion to 650 units/hr  Check 8h aPTT- will adjust heparin using aPTT until Eliquis cleared/heparin levels correlate to aPTT  Daily heparin level & CBC while on heparin   Ulice Dash, PharmD, BCPS Clinical Pharmacist   02/28/2019 5:25 PM

## 2019-02-28 NOTE — Progress Notes (Addendum)
Nutrition Follow-up RD working remotely.  DOCUMENTATION CODES:   Morbid obesity  INTERVENTION:   If supportive care continues, recommend begin TF via NGT:   Vital AF 1.2 at 40 ml/h (960 ml per day)   Pro-stat 30 ml QID   Provides 1552 kcal, 132 gm protein, 779 ml free water daily  NUTRITION DIAGNOSIS:   Increased nutrient needs related to acute illness, other (see comment)(COVID-19) as evidenced by estimated needs.  Ongoing   GOAL:   Provide needs based on ASPEN/SCCM guidelines  Unmet  MONITOR:   Vent status, Labs, Weight trends  ASSESSMENT:   78 year old female admitted with progressive SOB, COVID positive on 10/12. S/P PEA arrest, required intubation. Transferred to [facility] 10/14. Past medical history includes arthritis, bladder prolapse, HTN, mitral valve regurgitation, morbid obesity, PAF, peripheral neuropathy, polymyalgia rheumatica.  Patient is currently intubated on ventilator support. NG tube in place.  MV: 11.9 L/min Temp (24hrs), Avg:97.6 F (36.4 C), Min:96.3 F (35.7 C), Max:99.1 F (37.3 C)   Ongoing discussions regarding goals of care. Will require proning if aggressive care continues.   Labs reviewed. Potassium 2.8 (L) CBG's: [phone]  Medications reviewed and include K phos, mag sulfate, decadron, novolog.   Diet Order:   Diet Order            Diet NPO time specified  Diet effective now              EDUCATION NEEDS:   No education needs have been identified at this time  Skin:  Skin Assessment: Skin Integrity Issues: Skin Integrity Issues:: Other (Comment) Other: MASD to buttocks  Last BM:  PTA/unknown  Height:   Ht Readings from Last 1 Encounters:  03/05/2019 5\' 4"  (1.626 m)    Weight:   Wt Readings from Last 1 Encounters:  02/28/19 134.6 kg    Ideal Body Weight:  54.5 kg  BMI:  Body mass index is 50.94 kg/m.  Estimated Nutritional Needs:   Kcal:  1300-1600  Protein:  120-135 gm  Fluid:  >/= 2  L/day    Molli Barrows, RD, LDN, [place] Pager [phone] After Hours Pager [phone]

## 2019-02-28 NOTE — Plan of Care (Signed)
Pt intubated and sedated. Husband updated pt's current status and POC.

## 2019-02-28 NOTE — Progress Notes (Signed)
NAME:  [name] [name], MRN:  [id number], DOB:  13-Mar-1941, LOS: 2 ADMISSION DATE:  Mar 25, 2019, CONSULTATION DATE:  10/15 REFERRING MD:  EDP, CHIEF COMPLAINT:  dyspnea   Brief History   78 y/o female admitted on 10/13 to [facility] with dyspnea, had a PEA arrest in the ED requriing intubation, 3 doses of epinephrine with CPR.  Past Medical History  "inflammatory" arthritis Ascending aorta dilation HTN MR Obesity Atrial fibrillatino, paroxysmal Polymyalgia rheumatica  Significant Hospital Events   Cardiac arrest 10/13, admission 10/14 transfer to [facility]  Consults:  PCCM  Procedures:  ETT 10/13>   Significant Diagnostic Tests:  10/13 CT head> NAICP 10/13 CT angiogram chest> no PE, patchy infiltrates, rib fracutres  Micro Data:  10/13 SARS COV2 > positive 10/13 blood >  10/13 Urine >  10/14 resp > GPC in pairs  Antimicrobials/COVID Rx  10/13 Decadron >  10/13 Remdesivir >  10/13 Levaquin >   Interim history/subjective:  Admitted yesterday Remains mechanically ventilated Sedated on ventilator  Objective   Blood pressure (!) 141/103, pulse 71, temperature 98.4 F (36.9 C), resp. rate (!) 27, height 5\' 4"  (1.626 m), weight 134.6 kg, SpO2 97 %. CVP:  [10 mmHg-18 mmHg] 18 mmHg  Vent Mode: PRVC FiO2 (%):  [50 %-100 %] 60 % Set Rate:  [18 bmp-28 bmp] 28 bmp Vt Set:  [430 mL-460 mL] 430 mL PEEP:  [13 cmH20-16 cmH20] 14 cmH20 Plateau Pressure:  [26 cmH20-30 cmH20] 28 cmH20   Intake/Output Summary (Last 24 hours) at 02/28/2019 8768 Last data filed at 02/28/2019 0700 Gross per 24 hour  Intake 2455.69 ml  Output 1595 ml  Net 860.69 ml   Filed Weights   02/27/19 0134 02/28/19 0425  Weight: 134.4 kg 134.6 kg    Examination:  General:  Morbidly obese, in bed on vent HENT: NCAT ETT in place PULM: Crackles bilatearlly, vent supported breathing, flail chest noted, mild CV: RRR, no mgr GI: BS+, soft, nontender MSK: normal bulk and tone Neuro: sedated on vent   10/14 CXR  images personally reviewed: R> L bilateral airspace disease/consolidation  Resolved Hospital Problem list     Assessment & Plan:  ARDS due to COVID 10 pneumonia Continue mechanical ventilation per ARDS protocol Target TVol 6-8cc/kgIBW Target Plateau Pressure < 30cm H20 Target driving pressure less than 15 cm of water Target PaO2 55-65: titrate PEEP/FiO2 per protocol As long as PaO2 to FiO2 ratio is less than 1:150 position in prone position for 16 hours a day Check CVP daily if CVL in place Target CVP less than 4, diurese as necessary Ventilator associated pneumonia prevention protocol 10/15 no proning given large abdomen and flail chest  AKI Start tube feeding: trickle feeds Monitor BMET and UOP Replace electrolytes as needed  Flail chest: Pain control Supportive care, no proning  Need for sedation: Fentanyl infusion Prn versed Daily WUA RASS goal -1 to -2  Cardiac arrest, acute encephalopathy, at risk for hypoxemic injury: Supportive care Wean sedation as able Minimize fever with cooling blankets, tylenol  Atrial fibrillation Heparin infusion tele  Prognosis: poor   Best practice:  Diet: start tube feeding Pain/Anxiety/Delirium protocol (if indicated): yes, as above VAP protocol (if indicated): yes DVT prophylaxis: heparin drip GI prophylaxis: famotidine Glucose control: SSI Mobility: bed rest Code Status: limited code, no CPR if arrest again Family Communication: per [facility] Disposition: remain in ICU  Labs   CBC: Recent Labs  Lab 03/25/19 1742 Mar 25, 2019 2215 02/27/19 0152 02/27/19 0243 02/27/19 1825 02/28/19 0349 02/28/19  0350  WBC 6.5 10.7* 8.9 8.3  --   --  9.9  NEUTROABS 4.4  --   --   --   --   --  7.9*  HGB 11.7* 11.1* 11.0* 10.5* 9.5* 8.8* 8.7*  HCT 35.4* 34.6* 34.5* 33.2* 28.0* 26.0* 27.4*  MCV 104.7* 108.5* 108.2* 107.4*  --   --  105.8*  PLT 145* 155 137* 132*  --   --  153    Basic Metabolic Panel: Recent Labs  Lab 03-01-19 1742  03-01-2019 2237 02/27/19 0142 02/27/19 0152 02/27/19 1056 02/27/19 1825 02/28/19 0349  NA 134* 133*  --  134* 134* 136 137  K 3.2* 3.8  --  3.2* 5.4* 3.1* 2.9*  CL 92* 98  --  97* 100  --   --   CO2 27 21*  --  22 20*  --   --   GLUCOSE 111* 214*  --  218* 149*  --   --   BUN 17 18  --  18 21  --   --   CREATININE 1.42* 1.44*  --  1.28* 1.43*  --   --   CALCIUM 8.3* 7.3*  --  7.0* 7.6*  --   --   MG  --   --  1.7  --   --   --   --    GFR: Estimated Creatinine Clearance: 43.7 mL/min (A) (by C-G formula based on SCr of 1.43 mg/dL (H)). Recent Labs  Lab March 01, 2019 1929 03/01/19 2215 01-Mar-2019 2224 02/27/19 0152 02/27/19 0243 02/28/19 0350  PROCALCITON  --   --  12.80 44.96  --  65.54  WBC  --  10.7*  --  8.9 8.3 9.9  LATICACIDVEN 1.2  --   --   --   --   --     Liver Function Tests: Recent Labs  Lab 2019/03/01 1742 02/27/19 0152  AST 55* 182*  ALT 35 75*  ALKPHOS 48 52  BILITOT 0.9 1.4*  PROT 7.2 6.2*  ALBUMIN 3.5 2.9*   No results for input(s): LIPASE, AMYLASE in the last 168 hours. No results for input(s): AMMONIA in the last 168 hours.  ABG    Component Value Date/Time   PHART 7.405 02/28/2019 0349   PCO2ART 35.3 02/28/2019 0349   PO2ART 53.0 (L) 02/28/2019 0349   HCO3 22.1 02/28/2019 0349   TCO2 23 02/28/2019 0349   ACIDBASEDEF 2.0 02/28/2019 0349   O2SAT 88.0 02/28/2019 0349     Coagulation Profile: Recent Labs  Lab 03-01-2019 2215  INR 1.3*    Cardiac Enzymes: No results for input(s): CKTOTAL, CKMB, CKMBINDEX, TROPONINI in the last 168 hours.  HbA1C: Hgb A1c MFr Bld  Date/Time Value Ref Range Status  02/27/2019 02:43 AM 6.0 (H) 4.8 - 5.6 % Final    Comment:    (NOTE) Pre diabetes:          5.7%-6.4% Diabetes:              >6.4% Glycemic control for   <7.0% adults with diabetes     CBG: Recent Labs  Lab 02/27/19 1809 02/27/19 2013 02/27/19 2330 02/28/19 0413 02/28/19 0748  GLUCAP 151* 135* 140* 149* 139*     Critical care time:  35 minutes     Heber [place], MD [place] [facility] Pager: [phone] Cell: [phone] If no response, call [phone]

## 2019-03-01 ENCOUNTER — Inpatient Hospital Stay (HOSPITAL_COMMUNITY): Payer: HMO

## 2019-03-01 DIAGNOSIS — I5043 Acute on chronic combined systolic (congestive) and diastolic (congestive) heart failure: Secondary | ICD-10-CM | POA: Diagnosis not present

## 2019-03-01 DIAGNOSIS — R58 Hemorrhage, not elsewhere classified: Secondary | ICD-10-CM | POA: Diagnosis present

## 2019-03-01 DIAGNOSIS — U071 COVID-19: Secondary | ICD-10-CM | POA: Diagnosis not present

## 2019-03-01 DIAGNOSIS — J9621 Acute and chronic respiratory failure with hypoxia: Secondary | ICD-10-CM | POA: Diagnosis not present

## 2019-03-01 DIAGNOSIS — R140 Abdominal distension (gaseous): Secondary | ICD-10-CM | POA: Diagnosis not present

## 2019-03-01 DIAGNOSIS — G9340 Encephalopathy, unspecified: Secondary | ICD-10-CM | POA: Diagnosis not present

## 2019-03-01 DIAGNOSIS — K683 Retroperitoneal hematoma: Secondary | ICD-10-CM | POA: Diagnosis present

## 2019-03-01 LAB — COMPREHENSIVE METABOLIC PANEL
ALT: 51 U/L — ABNORMAL HIGH (ref 0–44)
AST: 67 U/L — ABNORMAL HIGH (ref 15–41)
Albumin: 2.4 g/dL — ABNORMAL LOW (ref 3.5–5.0)
Alkaline Phosphatase: 38 U/L (ref 38–126)
Anion gap: 13 (ref 5–15)
BUN: 30 mg/dL — ABNORMAL HIGH (ref 8–23)
CO2: 23 mmol/L (ref 22–32)
Calcium: 8.1 mg/dL — ABNORMAL LOW (ref 8.9–10.3)
Chloride: 102 mmol/L (ref 98–111)
Creatinine, Ser: 1.57 mg/dL — ABNORMAL HIGH (ref 0.44–1.00)
GFR calc Af Amer: 36 mL/min — ABNORMAL LOW (ref >60)
GFR calc non Af Amer: 31 mL/min — ABNORMAL LOW (ref >60)
Glucose, Bld: 197 mg/dL — ABNORMAL HIGH (ref 70–99)
Potassium: 3.3 mmol/L — ABNORMAL LOW (ref 3.5–5.1)
Sodium: 138 mmol/L (ref 135–145)
Total Bilirubin: 1 mg/dL (ref 0.3–1.2)
Total Protein: 5.4 g/dL — ABNORMAL LOW (ref 6.5–8.1)

## 2019-03-01 LAB — CBC WITH DIFFERENTIAL/PLATELET
Abs Immature Granulocytes: 0.65 10*3/uL — ABNORMAL HIGH (ref 0.00–0.07)
Basophils Absolute: 0 10*3/uL (ref 0.0–0.1)
Basophils Relative: 0 %
Eosinophils Absolute: 0 10*3/uL (ref 0.0–0.5)
Eosinophils Relative: 0 %
HCT: 23.8 % — ABNORMAL LOW (ref 36.0–46.0)
Hemoglobin: 7.7 g/dL — ABNORMAL LOW (ref 12.0–15.0)
Immature Granulocytes: 7 %
Lymphocytes Relative: 7 %
Lymphs Abs: 0.7 10*3/uL (ref 0.7–4.0)
MCH: 34.1 pg — ABNORMAL HIGH (ref 26.0–34.0)
MCHC: 32.4 g/dL (ref 30.0–36.0)
MCV: 105.3 fL — ABNORMAL HIGH (ref 80.0–100.0)
Monocytes Absolute: 1.1 10*3/uL — ABNORMAL HIGH (ref 0.1–1.0)
Monocytes Relative: 12 %
Neutro Abs: 7.2 10*3/uL (ref 1.7–7.7)
Neutrophils Relative %: 74 %
Platelets: 180 10*3/uL (ref 150–400)
RBC: 2.26 MIL/uL — ABNORMAL LOW (ref 3.87–5.11)
RDW: 15.3 % (ref 11.5–15.5)
WBC: 9.6 10*3/uL (ref 4.0–10.5)
nRBC: 0 % (ref 0.0–0.2)

## 2019-03-01 LAB — POTASSIUM: Potassium: 3.7 mmol/L (ref 3.5–5.1)

## 2019-03-01 LAB — APTT
aPTT: 68 seconds — ABNORMAL HIGH (ref 24–36)
aPTT: 38 seconds — ABNORMAL HIGH (ref 24–36)

## 2019-03-01 LAB — HEPARIN LEVEL (UNFRACTIONATED): Heparin Unfractionated: 0.78 IU/mL — ABNORMAL HIGH (ref 0.30–0.70)

## 2019-03-01 LAB — D-DIMER, QUANTITATIVE: D-Dimer, Quant: 1.27 ug/mL-FEU — ABNORMAL HIGH (ref 0.00–0.50)

## 2019-03-01 LAB — POCT I-STAT 7, (LYTES, BLD GAS, ICA,H+H)
Bicarbonate: 25.3 mmol/L (ref 20.0–28.0)
Calcium, Ion: 1.14 mmol/L — ABNORMAL LOW (ref 1.15–1.40)
HCT: 27 % — ABNORMAL LOW (ref 36.0–46.0)
Hemoglobin: 9.2 g/dL — ABNORMAL LOW (ref 12.0–15.0)
O2 Saturation: 88 %
Patient temperature: 37.1
Potassium: 3.5 mmol/L (ref 3.5–5.1)
Sodium: 138 mmol/L (ref 135–145)
TCO2: 26 mmol/L (ref 22–32)
pCO2 arterial: 40.6 mmHg (ref 32.0–48.0)
pH, Arterial: 7.403 (ref 7.350–7.450)
pO2, Arterial: 56 mmHg — ABNORMAL LOW (ref 83.0–108.0)

## 2019-03-01 LAB — GLUCOSE, CAPILLARY
Glucose-Capillary: 174 mg/dL — ABNORMAL HIGH (ref 70–99)
Glucose-Capillary: 201 mg/dL — ABNORMAL HIGH (ref 70–99)
Glucose-Capillary: 206 mg/dL — ABNORMAL HIGH (ref 70–99)
Glucose-Capillary: 212 mg/dL — ABNORMAL HIGH (ref 70–99)
Glucose-Capillary: 219 mg/dL — ABNORMAL HIGH (ref 70–99)
Glucose-Capillary: 230 mg/dL — ABNORMAL HIGH (ref 70–99)

## 2019-03-01 LAB — PHOSPHORUS: Phosphorus: 4 mg/dL (ref 2.5–4.6)

## 2019-03-01 LAB — C-REACTIVE PROTEIN: CRP: 6.5 mg/dL — ABNORMAL HIGH (ref <1.0)

## 2019-03-01 LAB — STREP PNEUMONIAE URINARY ANTIGEN: Strep Pneumo Urinary Antigen: NEGATIVE

## 2019-03-01 LAB — MAGNESIUM
Magnesium: 2.2 mg/dL (ref 1.7–2.4)
Magnesium: 2.2 mg/dL (ref 1.7–2.4)

## 2019-03-01 LAB — ABO/RH: ABO/RH(D): A POS

## 2019-03-01 MED ORDER — POTASSIUM CHLORIDE 10 MEQ/100ML IV SOLN
10.0000 meq | INTRAVENOUS | Status: AC
  Administered 2019-03-01 (×5): 10 meq via INTRAVENOUS

## 2019-03-01 MED ORDER — VITAL AF 1.2 CAL PO LIQD
1000.0000 mL | ORAL | Status: DC
  Administered 2019-03-01 – 2019-03-07 (×9): 1000 mL

## 2019-03-01 MED ORDER — SODIUM CHLORIDE 0.9% IV SOLUTION: Freq: Once | INTRAVENOUS | Status: DC

## 2019-03-01 MED ORDER — POTASSIUM CHLORIDE 10 MEQ/100ML IV SOLN
10.0000 meq | INTRAVENOUS | Status: DC
  Filled 2019-03-01: qty 100

## 2019-03-01 MED ORDER — IPRATROPIUM-ALBUTEROL 0.5-2.5 (3) MG/3ML IN SOLN: 3.0000 mL | Freq: Four times a day (QID) | RESPIRATORY_TRACT | Status: DC | PRN

## 2019-03-01 MED ORDER — PRO-STAT SUGAR FREE PO LIQD
30.0000 mL | Freq: Four times a day (QID) | ORAL | Status: DC
  Administered 2019-03-01 – 2019-03-07 (×26): 30 mL
  Filled 2019-03-01 (×23): qty 30

## 2019-03-01 MED ORDER — MUPIROCIN 2 % EX OINT
TOPICAL_OINTMENT | Freq: Two times a day (BID) | CUTANEOUS | Status: DC
  Administered 2019-03-01 – 2019-03-02 (×3): via NASAL
  Administered 2019-03-03: 1 via NASAL
  Administered 2019-03-03 – 2019-03-04 (×2): via NASAL
  Administered 2019-03-04 – 2019-03-05 (×2): 1 via NASAL
  Administered 2019-03-05: 09:00:00 via NASAL
  Administered 2019-03-06: 1 via NASAL
  Administered 2019-03-06 – 2019-03-07 (×2): via NASAL
  Filled 2019-03-01: qty 22

## 2019-03-01 NOTE — Progress Notes (Signed)
2000- Patient will inconsistently open eyes to pain/stimuli, does not make eye contact or follows commands. No movement of extremities to painful stimuli noted. 2100- Patients sister and brother called,both asked  this RN to speak to patient, this RN placed them on speaker phone so they could speak to patient.

## 2019-03-01 NOTE — Progress Notes (Signed)
[facility] PCCM  Some eye opening this afternoon, no purposeful (or any) movement of limbs.  Neuro prognosis remains uncertain  Try to hold sedation as much as possible  Consider neurology consult over weekend if no improvement  Roselie Awkward, MD [place] [facility] Pager: [phone] Cell: [phone] If no response, call [phone]

## 2019-03-01 NOTE — Progress Notes (Signed)
0230-APTT sent to lab.0400- Call to ;ab to inquire about APTT results, no answer. 0430- No answer at lab, charge RN aware.0500- Spoke to lab, will be sending APTT out now.

## 2019-03-01 NOTE — Progress Notes (Signed)
ANTICOAGULATION CONSULT NOTE - Follow-Up Consult  Pharmacy Consult for Heparin Indication: chest pain/ACS  Allergies  Allergen Reactions  . Fluoxetine Other (See Comments)    Altered mental status  . Adhesive [Tape] Rash    Cannot tolerate any tape or bandaids more than 24 hrs.  . Demerol [Meperidine Hcl] Rash  . Sulfa Antibiotics Rash    Patient Measurements: Height: 5\' 4"  (162.6 cm) Weight: 298 lb 15.1 oz (135.6 kg) IBW/kg (Calculated) : 54.7 Heparin dosing weight: 88kg  Vital Signs: Temp: 98.6 F (37 C) (10/16 0400) BP: 114/58 (10/16 0600) Pulse Rate: 72 (10/16 0600)  Labs: Recent Labs    02/22/2019 2024 03/08/2019 2215 02/25/2019 2224  02/27/19 0243 02/27/19 0245 02/27/19 1056  02/28/19 0130  02/28/19 0350 02/28/19 1350 03/01/19 0230 03/01/19 0329 03/01/19 0455  HGB  --  11.1*  --    < > 10.5*  --   --    < >  --    < > 8.7*  --   --  9.2* 7.7*  HCT  --  34.6*  --    < > 33.2*  --   --    < >  --    < > 27.4*  --   --  27.0* 23.8*  PLT  --  155  --    < > 132*  --   --   --   --   --  153  --   --   --  180  APTT  --  43*  --   --   --   --   --    < > >200*  --   --  147* 68*  --   --   LABPROT  --  15.7*  --   --   --   --   --   --   --   --   --   --   --   --   --   INR  --  1.3*  --   --   --   --   --   --   --   --   --   --   --   --   --   HEPARINUNFRC  --   --   --   --   --  2.08*  --   --   --   --  >2.20*  --   --   --  0.78*  CREATININE  --   --   --    < >  --   --  1.43*  --   --   --  1.29*  --   --   --  1.57*  TROPONINIHS 313*  --  444*  --   --   --   --   --   --   --   --   --   --   --   --    < > = values in this interval not displayed.    Estimated Creatinine Clearance: 40 mL/min (A) (by C-G formula based on SCr of 1.57 mg/dL (H)).   Medical History: Past Medical History:  Diagnosis Date  . Arthritis    "inflammatory" (10/17/2017)  . Ascending aorta dilatation (HCC)    50mm by echo 06/2018  . Female bladder prolapse   .  Hypertension   . Mitral regurgitation 12/14/2017   Moderate to severe by TEE/DCCV 09-2017, moderate  by echo 06/2018  . Morbid obesity ([place])   . On home oxygen therapy    "2L; 24/7" (10/17/2017)  . PAF (paroxysmal atrial fibrillation) (HCC) paf  . Peripheral neuropathy    "BLE" (10/17/2017)  . Polymyalgia rheumatica ([place])   . Recurrent UTI (urinary tract infection)     Medications:  Infusions:  . sodium chloride 50 mL/hr at 03/01/19 0600  . famotidine (PEPCID) IV Stopped (02/28/19 2228)  . fentaNYL infusion INTRAVENOUS 175 mcg/hr (03/01/19 0600)  . heparin 650 Units/hr (03/01/19 0600)  . norepinephrine (LEVOPHED) Adult infusion Stopped (02/27/19 5366)  . remdesivir 100 mg in NS 250 mL Stopped (02/28/19 1622)  . vancomycin Stopped (02/28/19 2135)    PTA Eliquis 5mg  po BID- LD unknown, but in past week  Assessment: 78 yo F on Eliquis PTA for hx Afib.  Admit with COVID PNA and PEA arrest.   Chest CT negative for PE.  Troponin elevated.  Currently intubated and unable to take Eliquis.  Pharmacy consulted to start IV heparin for ACS. CBC: Hg low (11.1), Pltc WNL at low end of range (155) Baseline aPTT (43sec) & INR (1.3) slightly above normal range.  Baseline heparin level 2.08- elevated due to Eliquis.    03/01/19  - aPTT 68,  therapeutic  - HL 0.78, not yet correlating with aPTT levels  - Hgb decreased to 7.7 - Some hematuria, oral bleeding, some bleeding also seen at PICC line site  seen per RN  - no line issues or interruptions  per RN  Goal of Therapy:  APTT 66-102 sec Heparin level 0.3-0.7 units/ml Monitor platelets by anticoagulation protocol: Yes   Plan:   Continue  heparin infusion at 650 units/hr  Check 8h aPTT- will adjust heparin using aPTT until Eliquis cleared/heparin levels correlate to aPTT  Daily heparin level & CBC while on heparin   Royetta Asal, PharmD, BCPS 03/01/2019 6:36 AM

## 2019-03-01 NOTE — Progress Notes (Signed)
Pt transported to and from CT on ventilator. Pt stable throughout with no complications. VS within normal limits.

## 2019-03-01 NOTE — Progress Notes (Signed)
NAME:  [name] [name], MRN:  [id number], DOB:  Jul 22, 1940, LOS: 3 ADMISSION DATE:  02/24/2019, CONSULTATION DATE:  10/15 REFERRING MD:  EDP, CHIEF COMPLAINT:  dyspnea   Brief History   78 y/o female admitted on 10/13 to [facility] with dyspnea, had a PEA arrest in the ED requriing intubation, 3 doses of epinephrine with CPR.  Past Medical History  "inflammatory" arthritis Ascending aorta dilation HTN MR Obesity Atrial fibrillatino, paroxysmal Polymyalgia rheumatica  Significant Hospital Events   Cardiac arrest 10/13, admission 10/14 transfer to [facility] 10/16 hemoglobin down, some pink urine but no frank bleeding identified  Consults:  PCCM  Procedures:  ETT 10/13>   Significant Diagnostic Tests:  10/13 CT head> NAICP 10/13 CT angiogram chest> no PE, patchy infiltrates, rib fractures 10/16 CT head > NAICP 10/16 CT abdomen/pelvis> large R pelvic sidewall extraperitoneal hematoma, RLL posterior flank hematoma, multiple rib fractures, hepatic steatosis  Micro Data:  10/13 SARS COV2 > positive 10/13 blood >  10/13 Urine >  10/14 resp > GPC in pairs  Antimicrobials/COVID Rx  10/13 Decadron >  10/13 Remdesivir >  10/13 Levaquin >   Interim history/subjective:  10/16 hemoglobin down, some pink urine but no frank bleeding identified Remains encephalopathic, minimal response on ventilator  Objective   Blood pressure (!) 114/55, pulse 69, temperature 98.8 F (37.1 C), resp. rate (!) 29, height 5\' 4"  (1.626 m), weight 135.6 kg, SpO2 100 %.    Vent Mode: PRVC FiO2 (%):  [50 %] 50 % Set Rate:  [28 bmp] 28 bmp Vt Set:  [430 mL] 430 mL PEEP:  [18 cmH20] 18 cmH20 Plateau Pressure:  [25 cmH20-28 cmH20] 25 cmH20   Intake/Output Summary (Last 24 hours) at 03/01/2019 1532 Last data filed at 03/01/2019 0700 Gross per 24 hour  Intake 2208.16 ml  Output 520 ml  Net 1688.16 ml   Filed Weights   02/27/19 0134 02/28/19 0425 03/01/19 0247  Weight: 134.4 kg 134.6 kg 135.6 kg     Examination:  General:  In bed on vent HENT: NCAT ETT in place PULM: CTA B, vent supported breathing CV: RRR, no mgr GI: BS+, soft, nontender MSK: normal bulk and tone Neuro: sedated on vent  10/14 CXR images personally reviewed: R> L bilateral airspace disease/consolidation  Resolved Hospital Problem list     Assessment & Plan:  ARDS due to COVID 10 pneumonia Ventilator dyssynchrony, air hunger Continue mechanical ventilation per ARDS protocol Target TVol 6-8cc/kgIBW Target Plateau Pressure < 30cm H20 Target driving pressure less than 15 cm of water Target PaO2 55-65: titrate PEEP/FiO2 per protocol As long as PaO2 to FiO2 ratio is less than 1:150 position in prone position for 16 hours a day Check CVP daily if CVL in place Target CVP less than 4, diurese as necessary Ventilator associated pneumonia prevention protocol 10/16 plan : no proning with flail chest  Bleeding, R flank sidewall hematoma CT abdomen to look for RP bleed (done) Hold heparin Supportive care Monitor for bleeding Transfuse PRBC for Hgb < 7 gm/dL  AKI: worse Monitor BMET and UOP Replace electrolytes as needed Would not administer IV fluids given ARDS  Flail chest: Pain control Supportive care no proning  Need for sedation: Fentanyl infusion Prn versed Daily WUA RASS goal -1 to -2  Cardiac arrest, acute encephalopathy, at risk for hypoxemic injury: Minimize sedation as able (difficult though given ventilator dyssynchrony, air hunger) CT head today  Atrial fibrillation Tele Heparin infusion > on hold with bleeding  Prognosis: poor  Best practice:  Diet: start tube feeding Pain/Anxiety/Delirium protocol (if indicated): yes, as above VAP protocol (if indicated): yes DVT prophylaxis: heparin drip GI prophylaxis: famotidine Glucose control: SSI Mobility: bed rest Code Status: limited code, no CPR if arrest again Family Communication: per [facility] Disposition: remain in ICU  Labs    CBC: Recent Labs  Lab 2019/03/02 1742 02-Mar-2019 2215 02/27/19 0152 02/27/19 0243 02/27/19 1825 02/28/19 0349 02/28/19 0350 03/01/19 0329 03/01/19 0455  WBC 6.5 10.7* 8.9 8.3  --   --  9.9  --  9.6  NEUTROABS 4.4  --   --   --   --   --  7.9*  --  7.2  HGB 11.7* 11.1* 11.0* 10.5* 9.5* 8.8* 8.7* 9.2* 7.7*  HCT 35.4* 34.6* 34.5* 33.2* 28.0* 26.0* 27.4* 27.0* 23.8*  MCV 104.7* 108.5* 108.2* 107.4*  --   --  105.8*  --  105.3*  PLT 145* 155 137* 132*  --   --  153  --  180    Basic Metabolic Panel: Recent Labs  Lab 03-02-19 2237 02/27/19 0142 02/27/19 0152 02/27/19 1056 02/27/19 1825 02/28/19 0349 02/28/19 0350 03/01/19 0329 03/01/19 0455  NA 133*  --  134* 134* 136 137 138 138 138  K 3.8  --  3.2* 5.4* 3.1* 2.9* 2.8* 3.5 3.3*  CL 98  --  97* 100  --   --  101  --  102  CO2 21*  --  22 20*  --   --  22  --  23  GLUCOSE 214*  --  218* 149*  --   --  151*  --  197*  BUN 18  --  18 21  --   --  21  --  30*  CREATININE 1.44*  --  1.28* 1.43*  --   --  1.29*  --  1.57*  CALCIUM 7.3*  --  7.0* 7.6*  --   --  7.6*  --  8.1*  MG  --  1.7  --   --   --   --  1.7  --  2.2  PHOS  --   --   --   --   --   --  2.7  --  4.0   GFR: Estimated Creatinine Clearance: 40 mL/min (A) (by C-G formula based on SCr of 1.57 mg/dL (H)). Recent Labs  Lab Mar 02, 2019 1929  02-Mar-2019 2224 02/27/19 0152 02/27/19 0243 02/28/19 0350 03/01/19 0455  PROCALCITON  --   --  12.80 44.96  --  65.54  --   WBC  --    < >  --  8.9 8.3 9.9 9.6  LATICACIDVEN 1.2  --   --   --   --   --   --    < > = values in this interval not displayed.    Liver Function Tests: Recent Labs  Lab 2019-03-02 1742 02/27/19 0152 02/28/19 0350 03/01/19 0455  AST 55* 182* 83* 67*  ALT 35 75* 55* 51*  ALKPHOS 48 52 40 38  BILITOT 0.9 1.4* 0.8 1.0  PROT 7.2 6.2* 5.3* 5.4*  ALBUMIN 3.5 2.9* 2.5* 2.4*   No results for input(s): LIPASE, AMYLASE in the last 168 hours. No results for input(s): AMMONIA in the last 168 hours.   ABG    Component Value Date/Time   PHART 7.403 03/01/2019 0329   PCO2ART 40.6 03/01/2019 0329   PO2ART 56.0 (L) 03/01/2019 0329   HCO3 25.3 03/01/2019  0329   TCO2 26 03/01/2019 0329   ACIDBASEDEF 2.0 02/28/2019 0349   O2SAT 88.0 03/01/2019 0329     Coagulation Profile: Recent Labs  Lab 03/11/2019 2215  INR 1.3*    Cardiac Enzymes: No results for input(s): CKTOTAL, CKMB, CKMBINDEX, TROPONINI in the last 168 hours.  HbA1C: Hgb A1c MFr Bld  Date/Time Value Ref Range Status  02/27/2019 02:43 AM 6.0 (H) 4.8 - 5.6 % Final    Comment:    (NOTE) Pre diabetes:          5.7%-6.4% Diabetes:              >6.4% Glycemic control for   <7.0% adults with diabetes     CBG: Recent Labs  Lab 02/28/19 2039 03/01/19 0021 03/01/19 0433 03/01/19 0859 03/01/19 1223  GLUCAP 183* 174* 201* 230* 219*     Critical care time: 35 minutes     Heber CarolinaBrent , MD [facility] Pager: [phone] Cell: [phone] If no response, call [phone]

## 2019-03-01 NOTE — Progress Notes (Signed)
Spoke with patient's son Quita Skye and gave updates on patient care and condition. Answered all questions regarding patient condition.  Quita Skye asked if we could set up a time for him to speak with patient later this evening. I told him I would have night nurse schedule a call for later in the evening. He was very grateful for the care his mother is receiving.

## 2019-03-01 NOTE — Progress Notes (Signed)
Nutrition Follow-up / Consult RD working remotely.  DOCUMENTATION CODES:   Morbid obesity  INTERVENTION:   Continue TF via NGT:   Change to Vital AF 1.2 at 40 ml/h (960 ml per day)   Pro-stat 30 ml QID   Provides 1552 kcal, 132 gm protein, 779 ml free water daily  NUTRITION DIAGNOSIS:   Increased nutrient needs related to acute illness, other (see comment)(COVID-19) as evidenced by estimated needs.  Ongoing   GOAL:   Provide needs based on ASPEN/SCCM guidelines  Unmet  MONITOR:   Vent status, Labs, Weight trends  ASSESSMENT:   78 year old female admitted with progressive SOB, COVID positive on 10/12. S/P PEA arrest, required intubation. Transferred to [place] 10/14. Past medical history includes arthritis, bladder prolapse, HTN, mitral valve regurgitation, morbid obesity, PAF, peripheral neuropathy, polymyalgia rheumatica.  NG tube in place. TF initiated yesterday evening; receiving Vital High Protein at 20 ml/h. Received MD Consult for TF initiation and management.  Patient remains intubated on ventilator support. Noted plans for proning x 16 h per day, no orders yet. MV: 12.1 L/min Temp (24hrs), Avg:99.2 F (37.3 C), Min:98.6 F (37 C), Max:99.7 F (37.6 C)   Labs reviewed. Potassium 3.3 (L) CBG's: [id number]  Medications reviewed and include KCl, decadron, novolog.   Diet Order:   Diet Order            Diet NPO time specified  Diet effective now              EDUCATION NEEDS:   No education needs have been identified at this time  Skin:  Skin Assessment: Skin Integrity Issues: Skin Integrity Issues:: Other (Comment) Other: MASD to buttocks  Last BM:  PTA/unknown  Height:   Ht Readings from Last 1 Encounters:  Mar 11, 2019 5\' 4"  (1.626 m)    Weight:   Wt Readings from Last 1 Encounters:  03/01/19 135.6 kg    Ideal Body Weight:  54.5 kg  BMI:  Body mass index is 51.31 kg/m.  Estimated Nutritional Needs:   Kcal:   1300-1600  Protein:  120-135 gm  Fluid:  >/= 2 L/day    Molli Barrows, RD, LDN, [place] Pager [phone] After Hours Pager [phone]

## 2019-03-01 NOTE — Progress Notes (Addendum)
PROGRESS NOTE    [name] [name]  HWE:[id number] DOB: 1940-08-25 DOA: 2019/02/27 PCP: Leanora Ivanoff., MD   Brief Narrative:  78 year old WF PMHx ascending aorta dilation, Chronic Systolic and Diastolic CHF, dilated cardiomyopathy, essential HTN, paroxysmal atrial fibrillation, mitral valve regurgitation moderate, polymyalgia rheumatica, chronic respiratory failure with hypoxia on 2 L O2 via [place] at home.  diagnosed as being Covid positive 10/12 presented with increased cough shortness of breath & hypoxia requiring 12L O2 , while being initially evaluated in the emergency room patient had a pulseless arrest.  Rhythm was asystole . Patient responded 3 doses of epinephrine during CPR with return of rhythm and blood pressure   Subjective: 10/16 minimally sedated, intubated.  Unresponsive to painful stimuli    Assessment & Plan:   Active Problems:   PAF (paroxysmal atrial fibrillation) (HCC)   HTN (hypertension)   DCM (dilated cardiomyopathy) (HCC)   IFG (impaired fasting glucose)   Morbid obesity (HCC)   Mitral regurgitation   Polymyalgia rheumatica (HCC)   Ascending aorta dilatation (HCC)   Respiratory arrest (HCC)   Acute on chronic respiratory failure with hypoxia (HCC)   Pneumonia due to COVID-19 virus   Acute on chronic combined systolic and diastolic CHF (congestive heart failure) (HCC)   Acute respiratory distress syndrome (ARDS) due to COVID-19 virus (HCC)   Aspiration pneumonia of both lower lobes due to gastric secretions (HCC)   Flail chest   Cardiac arrest (HCC)   Acute encephalopathy   Abdominal distention   Bleeding behind the abdominal cavity  Patient evaluated by PCCM today plan below.  Covid 19 pneumonia/Acute on Chronic respiratory failure with hypoxia -Decadron 8 mg BID x 10 days -Remdesivir per pharmacy protocol -Combivent QID   ARDS -Lung protective strategy -Currently no need for paralytic -1016 Dr. Carolyne Littles discussed goals of care with  husband.  See family communication -10/16 will decide on proning patient after obtaining head CT.  Marland Kitchen  Aspiration pneumonia- -Multiple admission chest x-ray arrest episodes -10/15 Tracheal aspirate pending    Flail chest -H& P reported flail chest, all review of PCXR unable to appreciate any fractured ribs and not mentioned by radiologist. -If present most likely secondary to CPR when patient coded -If present will increase weaning difficulty. -If present will require significant pain control/paralytics to eliminate vent dyssynchrony especially if patient is prone. -10/15 repeat PCXR; radiologist did not comment on flail chest.. -10/16 discussed case with radiologist patient has multiple bilateral rib fractures observed on her CT scan.  Status post Asystolic Arrest.   -Etiology of this is unclear.  May have been related to extreme hypoxia -10/14 echocardiogram shows worsening heart function see results below.    Acute on chronic systolic and diastolic CHF -Significantly reduced EF.  EF = 55% from echocardiogram 06/29/2018.  EF now 25 to 30%-also on previous echocardiogram mitral regurgitation read as moderate, now read as trace regurgitation.  -Patient cardiologist Dr. Armanda Magic.  Will consult cardiology, may need ICD placement once stable -Discussed case with Dr. Sherlean Foot cardiology.  She reviewed patient's chart, recommended medical management at this time.  See note from 10/15  Paroxysmal atrial fibrillation -Was on Eliquis (hold) while hospitalized. -10/16 Heparin IV  (Hold) see abdominal bleed  Bacteremia/positive GPC -10/15 given patient's poor prognosis will restart empiric vancomycin until cultures speciate -10/16 blood culture being reincubated for better growth.  Acute encephalopathy -Multifactorial, anoxic vs Covid 19 infection vs infection vs sedation. -Treat underlying cause  -EEG shows no seizure activity.  However shows diffuse encephalopathy see  results below.- -Have been recommended by admitting physician use [place] for cooling protocol, however patient outside window upon admission to [facility]. -10/16 patient on minimal sedation no response to painful stimuli. -CT head pending  Abdominal distention -KUB negative for obstruction.  See results below -OG tube to low intermittent suction overnight, did not decompress patient.  This may be patient's body normal habitus. -Begin trickle feeds   Abdominal bleed -10/16 discussed case with radiologist abdominal/pelvic CT showed RIGHT side extraperitoneal hematoma and RIGHT flank hematoma -10/16 see A. fib  Hx chronic polymyalgia rheumatica -On chronic prednisone 5 to 20 mg daily. -Currently on Decadron for Covid pneumonia which will cover her chronic steroid use.   Hypokalemia -Potassium goal>4 -Potassium IV 50 mEq -Repeat K/Mg    DVT prophylaxis: Heparin Code Status: Partial Family Communication: 10/16 spoke with husband counseled him on plan of care answered all questions  Disposition Plan: TBD   Consultants:  PCCM Phone consult cardiology   Procedures/Significant Events:  Head CT 10/13 >> neg CT angio chest 10/13 >> neg PE, consolidation bilateral, with patchy infiltrates,  mildly displaced anterior rib fractures: Bilateral ribs 2, 3, 4, 5 (up to moderately displaced on series 11, image 75), 6, 7, and 8. Additional lateral 6th 7th and 8th right rib fractures (flail segment) with similar displacement 10/14 echocardiogram;Left Ventricle: EF= 25 to 30%. -The left ventricle has severely decreased function.  -Left ventricular diastolic Doppler parameters are consistent with pseudonormalization pattern of LV diastolic filling. Diffuse hypokinesis. Right Ventricle:  moderately enlarged. No increase in right ventricular wall thickness.  -Global RV systolic function is has moderately reduced systolic function. The tricuspid regurgitant velocity is 2.43 m/s, and with an  assumed right atrial pressure of 8 mmHg, the estimated right ventricular systolic pressure is mildly elevated at 31.6 mmHg. Mitral Valve: The mitral valve is normal in structure. Trace mitral valve regurgitation. 10/14 EEG-;suggestive of profound diffuse encephalopathy, which could be secondary to sedated state, diffuse anoxic/hypoxic injury.  -No seizures or epileptiform discharges were seen  10/14 KUB; nonobstructive bowel gas pattern 10/14 PCXR;-mild cardiomegaly. -Fairly extensive bilateral interstitial and ground-glass opacities in addition to multifocal consolidations, interval worsening at the right upper lobe and bilateral lower lobes.  -No pneumothorax. 10/16 CT abdomen and pelvis Wo contrast;-Large, RIGHT pelvic sidewall extraperitoneal hematoma -RIGHT lower posterior flank hematoma. -Multiple bilateral acute rib deformities are identified likely related to CPR. -Hepatic steatosis. 10/16 CT head  Wo contrast-negative acute infarct   I have personally reviewed and interpreted all radiology studies and my findings are as above.  VENTILATOR SETTINGS: Vent mode; PRVC Rate set; 28 Vt Set; 430 FiO2; 50% PEEP; 18    Cultures 10/13 urine pending 10/13 blood LEFT hand NGTD 10/13 blood LEFT wrist positive GPC 10/14 tracheal aspirate pending 10/14 MRSA PCR negative      Antimicrobials: Anti-infectives (From admission, onward)   Start     Stop   02/28/19 2200  vancomycin (VANCOCIN) 1,500 mg in sodium chloride 0.9 % 500 mL IVPB  Status:  Discontinued     02/28/19 1144   02/28/19 2000  vancomycin (VANCOCIN) IVPB 1000 mg/200 mL premix         02/28/19 1000  levofloxacin (LEVAQUIN) IVPB 750 mg  Status:  Discontinued     02/28/19 1144   02/27/19 2200  remdesivir 100 mg in sodium chloride 0.9 % 250 mL IVPB     03/03/19 1559   02/27/19 1000  vancomycin (VANCOCIN) 2,500 mg  in sodium chloride 0.9 % 500 mL IVPB     02/27/19 1416   02/27/19 0930  levofloxacin (LEVAQUIN) IVPB 500  mg  Status:  Discontinued     02/27/19 0932   02/27/19 0000  remdesivir 200 mg in sodium chloride 0.9 % 250 mL IVPB     02/27/19 0039   03/11/19 1915  levofloxacin (LEVAQUIN) IVPB 500 mg     2019/03/11 2138   03-11-2019 1900  cefTRIAXone (ROCEPHIN) 1 g in sodium chloride 0.9 % 100 mL IVPB  Status:  Discontinued     2019/03/11 1902       Devices   LINES / TUBES:      Continuous Infusions:  sodium chloride 50 mL/hr at 03/01/19 0700   famotidine (PEPCID) IV Stopped (02/28/19 2228)   fentaNYL infusion INTRAVENOUS 175 mcg/hr (03/01/19 0924)   norepinephrine (LEVOPHED) Adult infusion Stopped (02/27/19 1610)   remdesivir 100 mg in NS 250 mL Stopped (02/28/19 1622)   vancomycin Stopped (02/28/19 2135)     Objective: Vitals:   03/01/19 0600 03/01/19 0700 03/01/19 0800 03/01/19 1200  BP: (!) 114/58 (!) 114/55    Pulse: 72 69    Resp: (!) 28 (!) 29    Temp: 98.6 F (37 C) 98.8 F (37.1 C)    TempSrc:   Esophageal Esophageal  SpO2: 100% 100%    Weight:      Height:        Intake/Output Summary (Last 24 hours) at 03/01/2019 1522 Last data filed at 03/01/2019 0700 Gross per 24 hour  Intake 2208.16 ml  Output 520 ml  Net 1688.16 ml   Filed Weights   02/27/19 0134 02/28/19 0425 03/01/19 0247  Weight: 134.4 kg 134.6 kg 135.6 kg   Physical Exam:  General: Minimal sedation/intubated, nonresponsive to painful stimuli, positive acute respiratory distress Eyes: negative scleral hemorrhage, negative anisocoria, negative icterus ENT: Negative Runny nose, negative gingival bleeding, #7.5 ETT tube in place minimal bloody discharge around tube.  Secondary laceration tone and possible mucosal of upper lip Neck:  Negative scars, masses, torticollis, lymphadenopathy, JVD Lungs: Tachypneic, clear to auscultation bilaterally without wheezes or crackles Cardiovascular: Regular rate and rhythm without murmur gallop or rub normal S1 and S2 Abdomen: negative abdominal pain, positive  distention, negative soft, bowel sounds, no rebound, no ascites, no appreciable mass, negative flank or back hematoma Extremities: No significant cyanosis, clubbing, or edema bilateral lower extremities Skin: Negative rashes, lesions, ulcers Psychiatric: Intubated/minimal sedation Central nervous system: Nonresponsive to painful stimuli,     Data Reviewed: Care during the described time interval was provided by me .  I have reviewed this patient's available data, including medical history, events of note, physical examination, and all test results as part of my evaluation.   CBC: Recent Labs  Lab 03/11/19 1742 March 11, 2019 2215 02/27/19 0152 02/27/19 0243 02/27/19 1825 02/28/19 0349 02/28/19 0350 03/01/19 0329 03/01/19 0455  WBC 6.5 10.7* 8.9 8.3  --   --  9.9  --  9.6  NEUTROABS 4.4  --   --   --   --   --  7.9*  --  7.2  HGB 11.7* 11.1* 11.0* 10.5* 9.5* 8.8* 8.7* 9.2* 7.7*  HCT 35.4* 34.6* 34.5* 33.2* 28.0* 26.0* 27.4* 27.0* 23.8*  MCV 104.7* 108.5* 108.2* 107.4*  --   --  105.8*  --  105.3*  PLT 145* 155 137* 132*  --   --  153  --  180   Basic Metabolic Panel: Recent Labs  Lab 03/14/2019 2237 02/27/19 0142 02/27/19 0152 02/27/19 1056 02/27/19 1825 02/28/19 0349 02/28/19 0350 03/01/19 0329 03/01/19 0455  NA 133*  --  134* 134* 136 137 138 138 138  K 3.8  --  3.2* 5.4* 3.1* 2.9* 2.8* 3.5 3.3*  CL 98  --  97* 100  --   --  101  --  102  CO2 21*  --  22 20*  --   --  22  --  23  GLUCOSE 214*  --  218* 149*  --   --  151*  --  197*  BUN 18  --  18 21  --   --  21  --  30*  CREATININE 1.44*  --  1.28* 1.43*  --   --  1.29*  --  1.57*  CALCIUM 7.3*  --  7.0* 7.6*  --   --  7.6*  --  8.1*  MG  --  1.7  --   --   --   --  1.7  --  2.2  PHOS  --   --   --   --   --   --  2.7  --  4.0   GFR: Estimated Creatinine Clearance: 40 mL/min (A) (by C-G formula based on SCr of 1.57 mg/dL (H)). Liver Function Tests: Recent Labs  Lab 02/25/2019 1742 02/27/19 0152 02/28/19 0350  03/01/19 0455  AST 55* 182* 83* 67*  ALT 35 75* 55* 51*  ALKPHOS 48 52 40 38  BILITOT 0.9 1.4* 0.8 1.0  PROT 7.2 6.2* 5.3* 5.4*  ALBUMIN 3.5 2.9* 2.5* 2.4*   No results for input(s): LIPASE, AMYLASE in the last 168 hours. No results for input(s): AMMONIA in the last 168 hours. Coagulation Profile: Recent Labs  Lab 02/23/2019 2215  INR 1.3*   Cardiac Enzymes: No results for input(s): CKTOTAL, CKMB, CKMBINDEX, TROPONINI in the last 168 hours. BNP (last 3 results) No results for input(s): PROBNP in the last 8760 hours. HbA1C: Recent Labs    02/27/19 0243  HGBA1C 6.0*   CBG: Recent Labs  Lab 02/28/19 2039 03/01/19 0021 03/01/19 0433 03/01/19 0859 03/01/19 1223  GLUCAP 183* 174* 201* 230* 219*   Lipid Profile: Recent Labs    02/27/19 0152 02/28/19 0350  CHOL  --  134  HDL  --  35*  LDLCALC  --  65  TRIG 174* 169*  CHOLHDL  --  3.8   Thyroid Function Tests: No results for input(s): TSH, T4TOTAL, FREET4, T3FREE, THYROIDAB in the last 72 hours. Anemia Panel: Recent Labs    02/14/2019 2217  FERRITIN 3,928*   Urine analysis:    Component Value Date/Time   COLORURINE YELLOW 03/05/2019 1752   APPEARANCEUR HAZY (A) 03/11/2019 1752   LABSPEC 1.013 02/16/2019 1752   PHURINE 6.0 02/23/2019 1752   GLUCOSEU NEGATIVE 02/16/2019 1752   HGBUR MODERATE (A) 03/01/2019 1752   BILIRUBINUR NEGATIVE 02/23/2019 1752   KETONESUR 5 (A) 03/06/2019 1752   PROTEINUR 100 (A) 03/01/2019 1752   UROBILINOGEN 0.2 10/27/2006 1337   NITRITE NEGATIVE 02/28/2019 1752   LEUKOCYTESUR LARGE (A) 02/28/2019 1752   Sepsis Labs: (procalcitonin:4,lacticidven:4)  ) Recent Results (from the past 240 hour(s))  SARS CORONAVIRUS 2 (TAT 6-24 HRS) Nasopharyngeal Nasopharyngeal Swab     Status: Abnormal   Collection Time: 02/22/2019  5:42 PM   Specimen: Nasopharyngeal Swab  Result Value Ref Range Status   SARS Coronavirus 2 POSITIVE (A) NEGATIVE Final    Comment: RESULT  CALLED TO, READ  BACK BY AND VERIFIED WITH: Wendi Maya, RN AT [phone] ON 02/27/2019 BY SAINVILUS S (NOTE) SARS-CoV-2 target nucleic acids are DETECTED. The SARS-CoV-2 RNA is generally detectable in upper and lower respiratory specimens during the acute phase of infection. Positive results are indicative of active infection with SARS-CoV-2. Clinical  correlation with patient history and other diagnostic information is necessary to determine patient infection status. Positive results do  not rule out bacterial infection or co-infection with other viruses. The expected result is Negative. Fact Sheet for Patients: SugarRoll.be Fact Sheet for Healthcare Providers: https://www.-mathews.com/ This test is not yet approved or cleared by the [place] FDA and  has been authorized for detection and/or diagnosis of SARS-CoV-2 by FDA under an Emergency Use Authorization (EUA). This EUA will remain  in effect (meaning this test can b e used) for the duration of the COVID-19 declaration under Section 564(b)(1) of the Act, 21 U.S.C. section 360bbb-3(b)(1), unless the authorization is terminated or revoked sooner. Performed at [facility], [place] [address], [place], [place] [address]   Urine Culture     Status: None (Preliminary result)   Collection Time: 02/18/2019  5:52 PM   Specimen: Urine, Clean Catch  Result Value Ref Range Status   Specimen Description   Final    URINE, CLEAN CATCH Performed at [facility], [place] [address], [place], [place] [address]    Special Requests   Final    NONE Performed at [facility], [place] [address] [place], [place] [address]    Culture   Final    CULTURE REINCUBATED FOR BETTER GROWTH Performed at [facility], [place] [address], [place], [place] [address]    Report Status PENDING  Incomplete  Blood culture (routine x 2)     Status: None (Preliminary result)   Collection Time: 02/24/2019   9:48 PM   Specimen: BLOOD  Result Value Ref Range Status   Specimen Description   Final    BLOOD BLOOD LEFT HAND Performed at [facility] [address] [place], [place] [address]    Special Requests   Final    BOTTLES DRAWN AEROBIC AND ANAEROBIC Blood Culture adequate volume Performed at [facility] [address], [place], [place] [address]    Culture   Final    NO GROWTH 3 DAYS Performed at [facility], [place] [address], [place], [place] [address]    Report Status PENDING  Incomplete  Blood culture (routine x 2)     Status: None (Preliminary result)   Collection Time: 03/09/2019  9:48 PM   Specimen: BLOOD  Result Value Ref Range Status   Specimen Description   Final    BLOOD BLOOD LEFT WRIST Performed at [facility] [address], [place], [place] [address]    Special Requests   Final    BOTTLES DRAWN AEROBIC AND ANAEROBIC Blood Culture adequate volume Performed at [facility] [address] [place], [place] [address]    Culture  Setup Time   Final    AEROBIC BOTTLE ONLY GRAM POSITIVE COCCI CRITICAL RESULT CALLED TO, READ BACK BY AND VERIFIED WITH: S CHRISTY PHARMD 02/28/19 JDW 1723    Culture   Final    CULTURE REINCUBATED FOR BETTER GROWTH Performed at [facility], [place] [address] [place],  [address]    Report Status PENDING  Incomplete  Culture, respiratory (non-expectorated)  Status: None (Preliminary result)   Collection Time: 02/27/19 11:30 AM   Specimen: Tracheal Aspirate; Respiratory  Result Value Ref Range Status   Specimen Description   Final    TRACHEAL ASPIRATE Performed at [facility], [address], [place], [place] [address]    Special Requests   Final    NONE Performed at [facility], [address], [place], [place] [address]    Gram Stain   Final    RARE WBC PRESENT,BOTH PMN AND MONONUCLEAR RARE  GRAM POSITIVE COCCI IN PAIRS    Culture   Final    CULTURE REINCUBATED FOR BETTER GROWTH Performed at [facility], [address], [place], [place] [address]    Report Status PENDING  Incomplete  MRSA PCR Screening     Status: None   Collection Time: 02/27/19  5:20 PM   Specimen: Nasal Mucosa; Nasopharyngeal  Result Value Ref Range Status   MRSA by PCR NEGATIVE NEGATIVE Final    Comment:        The GeneXpert MRSA Assay (FDA approved for NASAL specimens only), is one component of a comprehensive MRSA colonization surveillance program. It is not intended to diagnose MRSA infection nor to guide or monitor treatment for MRSA infections. Performed at [facility], [address] [place], [place] [address]          Radiology Studies: Ct Abdomen Pelvis Wo Contrast  Result Date: 03/01/2019 CLINICAL DATA:  Status post CPR. Abdominal distension. EXAM: CT ABDOMEN AND PELVIS WITHOUT CONTRAST TECHNIQUE: Multidetector CT imaging of the abdomen and pelvis was performed following the standard protocol without IV contrast. COMPARISON:  None FINDINGS: Lower chest: Small right pleural effusion. Bilateral posterior pleural thickening. Patchy areas of ground-glass attenuation identified scattered throughout the imaged portions of the lungs. Hepatobiliary: Diffuse hepatic steatosis. No focal liver abnormality. The gallbladder is not visualized compatible with prior cholecystectomy. No biliary dilatation. Pancreas: Unremarkable. No pancreatic ductal dilatation or surrounding inflammatory changes. Spleen: Normal in size without focal abnormality. Adrenals/Urinary Tract: Normal appearance of the adrenal glands. No kidney mass or hydronephrosis identified bilaterally. The urinary bladder is partially collapsed around a Foley catheter balloon. Stomach/Bowel: Stomach is within normal limits. Appendix appears normal. No evidence of bowel wall thickening, distention, or inflammatory  changes. Vascular/Lymphatic: Aortic atherosclerosis. No aneurysm. No abdominopelvic adenopathy identified. Reproductive: Status post hysterectomy. No adnexal masses. Other: There is a large, right pelvic sidewall the extraperitoneal hematoma is identified measuring 5.6 x 18.1 cm. A small amount of hemorrhage extends into the right lower quadrant retroperitoneum. Musculoskeletal: Bones are diffusely osteopenic diminishing detail. Multiple bilateral acute rib deformities are identified likely related to CPR. Thoracolumbar degenerative disc disease. No pelvic bone fracture identified. Right lower posterior flank a hematoma is identified measuring 9.8 x 4.7 by 5.3 cm. IMPRESSION: 1. Large, right pelvic sidewall extraperitoneal hematoma is identified. 2. Right lower posterior flank hematoma. 3. Multiple bilateral acute rib deformities are identified likely related to CPR. 4. Hepatic steatosis. Aortic Atherosclerosis (ICD10-I70.0). Electronically Signed   By: Signa Kell M.D.   On: 03/01/2019 15:00   Ct Head Wo Contrast  Result Date: 03/01/2019 CLINICAL DATA:  Focal neurologic deficit. EXAM: CT HEAD WITHOUT CONTRAST TECHNIQUE: Contiguous axial images were obtained from the base of the skull through the vertex without intravenous contrast. COMPARISON:  CT head dated 02/27/2019. FINDINGS: Brain: No evidence of acute infarction, hemorrhage, hydrocephalus, extra-axial collection or mass lesion/mass effect. There is mild cerebral volume loss with associated  ex vacuo dilatation. Vascular: There are vascular calcifications in the carotid siphons. Skull: Normal. Negative for fracture or focal lesion. Sinuses/Orbits: There is mild ethmoid sinus disease. Other: A nasoenteric tube is partially imaged. An endotracheal tube is partially imaged. IMPRESSION: No acute intracranial process. Electronically Signed   By: Romona Curlsyler  Litton M.D.   On: 03/01/2019 14:47   Dg Chest Port 1 View  Result Date: 02/28/2019 CLINICAL DATA:   Flail chest. EXAM: PORTABLE CHEST 1 VIEW COMPARISON:  02/27/2019. FINDINGS: Endotracheal tube, NG tube, right PICC line in stable position. Stable cardiomegaly. Bilateral pulmonary infiltrates are again noted. Slight improvement in right upper lung infiltrate. Small left pleural effusion cannot be excluded. No pneumothorax. IMPRESSION: 1.  Lines and tubes in stable position. 2. Bilateral pulmonary infiltrates are again noted. Slight improvement in right upper lung infiltrate. Small left pleural effusion cannot be excluded. 3.  Stable cardiomegaly. Electronically Signed   By: Maisie Fushomas  Register   On: 02/28/2019 15:33   Dg Chest Port 1 View  Result Date: 02/27/2019 CLINICAL DATA:  Acute respiratory failure EXAM: PORTABLE CHEST 1 VIEW COMPARISON:  03/06/2019, 10/17/2017 FINDINGS: Endotracheal tube tip about 2.8 cm superior to the carina. Esophageal tube tip is below the diaphragm. Right upper extremity catheter tip overlies the brachiocephalic region. Mild cardiomegaly. Fairly extensive bilateral interstitial and ground-glass opacities in addition to multifocal consolidations, interval worsening at the right upper lobe and bilateral lower lobes. Aortic atherosclerosis. No pneumothorax. IMPRESSION: 1. Support lines and tubes as above. New right upper extremity central venous catheter with tip superimposing the right brachiocephalic region 2. Interval worsening of bilateral right greater than left pneumonia 3. Cardiomegaly Electronically Signed   By: Jasmine PangKim  Fujinaga M.D.   On: 02/27/2019 19:38        Scheduled Meds:  sodium chloride   Intravenous Once   aspirin EC  81 mg Oral Daily   chlorhexidine  15 mL Mouth/Throat BID   Chlorhexidine Gluconate Cloth  6 each Topical Daily   Chlorhexidine Gluconate Cloth  6 each Topical Q0600   dexamethasone (DECADRON) injection  8 mg Intravenous Q12H   feeding supplement (PRO-STAT SUGAR FREE 64)  30 mL Per Tube QID   feeding supplement (VITAL AF 1.2 CAL)  1,000  mL Per Tube Q24H   fentaNYL (SUBLIMAZE) injection  25 mcg Intravenous Once   insulin aspart  0-9 Units Subcutaneous Q4H   ipratropium-albuterol  3 mL Nebulization Q6H   lidocaine  1 patch Transdermal Q24H   mouth rinse  15 mL Mouth Rinse 10 times per day   mupirocin ointment  1 application Nasal BID   sodium chloride flush  10-40 mL Intracatheter Q12H   Continuous Infusions:  sodium chloride 50 mL/hr at 03/01/19 0700   famotidine (PEPCID) IV Stopped (02/28/19 2228)   fentaNYL infusion INTRAVENOUS 175 mcg/hr (03/01/19 0924)   norepinephrine (LEVOPHED) Adult infusion Stopped (02/27/19 [id number])   remdesivir 100 mg in NS 250 mL Stopped (02/28/19 1622)   vancomycin Stopped (02/28/19 2135)     LOS: 3 days   The patient is critically ill with multiple organ systems failure and requires high complexity decision making for assessment and support, frequent evaluation and titration of therapies, application of advanced monitoring technologies and extensive interpretation of multiple databases. Critical Care Time devoted to patient care services described in this note  Time spent: 40 minutes     Taren Dymek, Roselind MessierURTIS J, MD [facility] Pager [phone]  If 7PM-7AM, please contact night-coverage [email or web address] Password [email or web address] 03/01/2019, 3:22 PM

## 2019-03-02 DIAGNOSIS — G9340 Encephalopathy, unspecified: Secondary | ICD-10-CM | POA: Diagnosis not present

## 2019-03-02 DIAGNOSIS — R140 Abdominal distension (gaseous): Secondary | ICD-10-CM | POA: Diagnosis not present

## 2019-03-02 DIAGNOSIS — U071 COVID-19: Secondary | ICD-10-CM | POA: Diagnosis not present

## 2019-03-02 DIAGNOSIS — I5043 Acute on chronic combined systolic (congestive) and diastolic (congestive) heart failure: Secondary | ICD-10-CM | POA: Diagnosis not present

## 2019-03-02 DIAGNOSIS — J9621 Acute and chronic respiratory failure with hypoxia: Secondary | ICD-10-CM | POA: Diagnosis not present

## 2019-03-02 LAB — CBC WITH DIFFERENTIAL/PLATELET
Abs Immature Granulocytes: 0.47 10*3/uL — ABNORMAL HIGH (ref 0.00–0.07)
Basophils Absolute: 0 10*3/uL (ref 0.0–0.1)
Basophils Relative: 0 %
Eosinophils Absolute: 0 10*3/uL (ref 0.0–0.5)
Eosinophils Relative: 0 %
HCT: 21.3 % — ABNORMAL LOW (ref 36.0–46.0)
Hemoglobin: 6.8 g/dL — CL (ref 12.0–15.0)
Immature Granulocytes: 4 %
Lymphocytes Relative: 8 %
Lymphs Abs: 1 10*3/uL (ref 0.7–4.0)
MCH: 34 pg (ref 26.0–34.0)
MCHC: 31.9 g/dL (ref 30.0–36.0)
MCV: 106.5 fL — ABNORMAL HIGH (ref 80.0–100.0)
Monocytes Absolute: 1.9 10*3/uL — ABNORMAL HIGH (ref 0.1–1.0)
Monocytes Relative: 15 %
Neutro Abs: 9.4 10*3/uL — ABNORMAL HIGH (ref 1.7–7.7)
Neutrophils Relative %: 73 %
Platelets: 193 10*3/uL (ref 150–400)
RBC: 2 MIL/uL — ABNORMAL LOW (ref 3.87–5.11)
RDW: 15.4 % (ref 11.5–15.5)
WBC: 12.8 10*3/uL — ABNORMAL HIGH (ref 4.0–10.5)
nRBC: 0.3 % — ABNORMAL HIGH (ref 0.0–0.2)

## 2019-03-02 LAB — GLUCOSE, CAPILLARY
Glucose-Capillary: 187 mg/dL — ABNORMAL HIGH (ref 70–99)
Glucose-Capillary: 191 mg/dL — ABNORMAL HIGH (ref 70–99)
Glucose-Capillary: 200 mg/dL — ABNORMAL HIGH (ref 70–99)
Glucose-Capillary: 208 mg/dL — ABNORMAL HIGH (ref 70–99)
Glucose-Capillary: 216 mg/dL — ABNORMAL HIGH (ref 70–99)
Glucose-Capillary: 229 mg/dL — ABNORMAL HIGH (ref 70–99)
Glucose-Capillary: 260 mg/dL — ABNORMAL HIGH (ref 70–99)

## 2019-03-02 LAB — CULTURE, RESPIRATORY W GRAM STAIN: Culture: NORMAL

## 2019-03-02 LAB — COMPREHENSIVE METABOLIC PANEL
ALT: 61 U/L — ABNORMAL HIGH (ref 0–44)
AST: 84 U/L — ABNORMAL HIGH (ref 15–41)
Albumin: 2.4 g/dL — ABNORMAL LOW (ref 3.5–5.0)
Alkaline Phosphatase: 41 U/L (ref 38–126)
Anion gap: 11 (ref 5–15)
BUN: 38 mg/dL — ABNORMAL HIGH (ref 8–23)
CO2: 24 mmol/L (ref 22–32)
Calcium: 8 mg/dL — ABNORMAL LOW (ref 8.9–10.3)
Chloride: 104 mmol/L (ref 98–111)
Creatinine, Ser: 1.68 mg/dL — ABNORMAL HIGH (ref 0.44–1.00)
GFR calc Af Amer: 33 mL/min — ABNORMAL LOW (ref >60)
GFR calc non Af Amer: 29 mL/min — ABNORMAL LOW (ref >60)
Glucose, Bld: 186 mg/dL — ABNORMAL HIGH (ref 70–99)
Potassium: 4 mmol/L (ref 3.5–5.1)
Sodium: 139 mmol/L (ref 135–145)
Total Bilirubin: 1.2 mg/dL (ref 0.3–1.2)
Total Protein: 5.4 g/dL — ABNORMAL LOW (ref 6.5–8.1)

## 2019-03-02 LAB — POCT I-STAT 7, (LYTES, BLD GAS, ICA,H+H)
Bicarbonate: 24.8 mmol/L (ref 20.0–28.0)
Calcium, Ion: 1.2 mmol/L (ref 1.15–1.40)
HCT: 19 % — ABNORMAL LOW (ref 36.0–46.0)
Hemoglobin: 6.5 g/dL — CL (ref 12.0–15.0)
O2 Saturation: 90 %
Patient temperature: 37
Potassium: 3.9 mmol/L (ref 3.5–5.1)
Sodium: 136 mmol/L (ref 135–145)
TCO2: 26 mmol/L (ref 22–32)
pCO2 arterial: 40.5 mmHg (ref 32.0–48.0)
pH, Arterial: 7.395 (ref 7.350–7.450)
pO2, Arterial: 59 mmHg — ABNORMAL LOW (ref 83.0–108.0)

## 2019-03-02 LAB — PREPARE RBC (CROSSMATCH)

## 2019-03-02 LAB — MAGNESIUM: Magnesium: 2.6 mg/dL — ABNORMAL HIGH (ref 1.7–2.4)

## 2019-03-02 LAB — D-DIMER, QUANTITATIVE: D-Dimer, Quant: 2.11 ug/mL-FEU — ABNORMAL HIGH (ref 0.00–0.50)

## 2019-03-02 LAB — C-REACTIVE PROTEIN: CRP: 4 mg/dL — ABNORMAL HIGH (ref <1.0)

## 2019-03-02 LAB — PHOSPHORUS: Phosphorus: 3.3 mg/dL (ref 2.5–4.6)

## 2019-03-02 MED ORDER — SODIUM CHLORIDE 0.9% IV SOLUTION: Freq: Once | INTRAVENOUS | Status: DC

## 2019-03-02 MED ORDER — FAMOTIDINE 40 MG/5ML PO SUSR
20.0000 mg | Freq: Every day | ORAL | Status: DC
  Administered 2019-03-02 – 2019-03-06 (×5): 20 mg via ORAL
  Filled 2019-03-02 (×5): qty 2.5

## 2019-03-02 MED ORDER — FUROSEMIDE 10 MG/ML IJ SOLN
40.0000 mg | Freq: Once | INTRAMUSCULAR | Status: AC
  Administered 2019-03-02: 40 mg via INTRAVENOUS
  Filled 2019-03-02: qty 4

## 2019-03-02 NOTE — Progress Notes (Signed)
Text page to Dr.Woods for critical lab HBG of 6.8, awaiting call back.

## 2019-03-02 NOTE — Progress Notes (Signed)
Results for RONAN, DION (MRN [id number]) as of 03/02/2019 04:31  Ref. Range 03/02/2019 03:33  Sample type Unknown ARTERIAL  pH, Arterial Latest Ref Range: 7.350 - 7.450  7.395  pCO2 arterial Latest Ref Range: 32.0 - 48.0 mmHg 40.5  pO2, Arterial Latest Ref Range: 83.0 - 108.0 mmHg 59.0 (L)  TCO2 Latest Ref Range: 22 - 32 mmol/L 26  Bicarbonate Latest Ref Range: 20.0 - 28.0 mmol/L 24.8  O2 Saturation Latest Units: % 90.0  Patient temperature Unknown 37.0 C  Sodium Latest Ref Range: 135 - 145 mmol/L 136  Potassium Latest Ref Range: 3.5 - 5.1 mmol/L 3.9  Calcium Ionized Latest Ref Range: 1.15 - 1.40 mmol/L 1.20  Hemoglobin Latest Ref Range: 12.0 - 15.0 g/dL 6.5 (LL)  HCT Latest Ref Range: 36.0 - 46.0 % 19.0 (L)  ABG drew on PC 14, RR 28, Peep 18 and FIO2 of 50%

## 2019-03-02 NOTE — Progress Notes (Signed)
Hgb on iStat 6.5. MD paged. CBC ordered with morning labs

## 2019-03-02 NOTE — Progress Notes (Signed)
Spoke with son Quita Skye about patient condition and answered his questions.

## 2019-03-02 NOTE — Progress Notes (Addendum)
PROGRESS NOTE    [name] [name]  WUJ:[id number] DOB: [birth year] DOA: Mar 19, 2019 PCP: Leanora Ivanoff., MD   Brief Narrative:  78 year old WF PMHx ascending aorta dilation, Chronic Systolic and Diastolic CHF, dilated cardiomyopathy, essential HTN, paroxysmal atrial fibrillation, mitral valve regurgitation moderate, polymyalgia rheumatica, chronic respiratory failure with hypoxia on 2 L O2 via [place] at home.  diagnosed as being Covid positive 10/12 presented with increased cough shortness of breath & hypoxia requiring 12L O2 , while being initially evaluated in the emergency room patient had a pulseless arrest.  Rhythm was asystole . Patient responded 3 doses of epinephrine during CPR with return of rhythm and blood pressure   Subjective: 10/17 sedated/intubated.  Partially opens eyes to painful stimulation   Assessment & Plan:   Active Problems:   PAF (paroxysmal atrial fibrillation) (HCC)   HTN (hypertension)   DCM (dilated cardiomyopathy) (HCC)   IFG (impaired fasting glucose)   Morbid obesity (HCC)   Mitral regurgitation   Polymyalgia rheumatica (HCC)   Ascending aorta dilatation (HCC)   Respiratory arrest (HCC)   Acute on chronic respiratory failure with hypoxia (HCC)   Pneumonia due to COVID-19 virus   Acute on chronic combined systolic and diastolic CHF (congestive heart failure) (HCC)   Acute respiratory distress syndrome (ARDS) due to COVID-19 virus (HCC)   Aspiration pneumonia of both lower lobes due to gastric secretions (HCC)   Flail chest   Cardiac arrest (HCC)   Acute encephalopathy   Abdominal distention   Bleeding behind the abdominal cavity  Patient evaluated by PCCM today plan below.  Covid 19 pneumonia/Acute on Chronic respiratory failure with hypoxia -Decadron 8 mg BID x 10 days -Remdesivir per pharmacy protocol -Combivent QID  - Discontinue fentanyl.  Sedation holiday.  ARDS -Lung protective strategy -Currently no need for paralytic -1016 Dr.  Carolyne Littles discussed goals of care with husband.  See family communication -10/16 will decide on proning patient after obtaining head CT. -10/17 patient with continued blood loss, most likely extension of right side hematomas hold on Proning.  Aspiration pneumonia- -Multiple admission chest x-ray arrest episodes -10/15 Tracheal aspirate normal flora  Flail chest -H& P reported flail chest, all review of PCXR unable to appreciate any fractured ribs and not mentioned by radiologist. -If present most likely secondary to CPR when patient coded -If present will increase weaning difficulty. -If present will require significant pain control/paralytics to eliminate vent dyssynchrony especially if patient is prone. -10/15 repeat PCXR; radiologist did not comment on flail chest.. -10/16 discussed case with radiologist patient has multiple bilateral rib fractures observed on her CT scan.  Status post Asystolic Arrest.   -Etiology of this is unclear.  May have been related to extreme hypoxia -10/14 echocardiogram shows worsening heart function see results below.    Acute on chronic systolic and diastolic CHF -Significantly reduced EF.  EF = 55% from echocardiogram 06/29/2018.  EF now 25 to 30%-also on previous echocardiogram mitral regurgitation read as moderate, now read as trace regurgitation.  -Patient cardiologist Dr. Armanda Magic.  Will consult cardiology, may need ICD placement once stable -Discussed case with Dr. Sherlean Foot cardiology.  She reviewed patient's chart, recommended medical management at this time.  See note from 10/15  Paroxysmal atrial fibrillation -Was on Eliquis (hold) while hospitalized. -10/16 Heparin IV  (Hold) see abdominal bleed  Bacteremia/positive GPC -10/15 given patient's poor prognosis will restart empiric vancomycin until cultures speciate -10/16 blood culture being reincubated for better growth.  Acute encephalopathy -Multifactorial, anoxic  vs  Covid 19 infection vs infection vs sedation. -Treat underlying cause  -EEG shows no seizure activity.  However shows diffuse encephalopathy see results below.- -Have been recommended by admitting physician use [place] for cooling protocol, however patient outside window upon admission to [facility]. -10/16 patient on minimal sedation no response to painful stimuli. -CT head negative acute findings see results below - 10/17 discontinue fentanyl sedation holiday.  Abdominal distention -KUB negative for obstruction.  See results below -OG tube to low intermittent suction overnight, did not decompress patient.  This may be patient's body normal habitus. -10/17 hold feeds; EGD this afternoon per GI  Abdominal bleed -10/16 discussed case with radiologist abdominal/pelvic CT showed RIGHT side extraperitoneal hematoma and RIGHT flank hematoma -10/16 see A. Fib -10/17 transfused 2 units PRBC  Hx chronic polymyalgia rheumatica -On chronic prednisone 5 to 20 mg daily. -Currently on Decadron for Covid pneumonia which will cover her chronic steroid use.   Hypokalemia -Potassium goal>4    DVT prophylaxis: Heparin Code Status: Partial Family Communication: 10/17 spoke with husband counseled him on plan of care answered all questions  Disposition Plan: TBD   Consultants:  PCCM Phone consult cardiology   Procedures/Significant Events:  Head CT 10/13 >> neg CT angio chest 10/13 >> neg PE, consolidation bilateral, with patchy infiltrates,  mildly displaced anterior rib fractures: Bilateral ribs 2, 3, 4, 5 (up to moderately displaced on series 11, image 75), 6, 7, and 8. Additional lateral 6th 7th and 8th right rib fractures (flail segment) with similar displacement 10/14 echocardiogram;Left Ventricle: EF= 25 to 30%. -The left ventricle has severely decreased function.  -Left ventricular diastolic Doppler parameters are consistent with pseudonormalization pattern of LV diastolic filling. Diffuse  hypokinesis. Right Ventricle:  moderately enlarged. No increase in right ventricular wall thickness.  -Global RV systolic function is has moderately reduced systolic function. The tricuspid regurgitant velocity is 2.43 m/s, and with an assumed right atrial pressure of 8 mmHg, the estimated right ventricular systolic pressure is mildly elevated at 31.6 mmHg. Mitral Valve: The mitral valve is normal in structure. Trace mitral valve regurgitation. 10/14 EEG-;suggestive of profound diffuse encephalopathy, which could be secondary to sedated state, diffuse anoxic/hypoxic injury.  -No seizures or epileptiform discharges were seen  10/14 KUB; nonobstructive bowel gas pattern 10/14 PCXR;-mild cardiomegaly. -Fairly extensive bilateral interstitial and ground-glass opacities in addition to multifocal consolidations, interval worsening at the right upper lobe and bilateral lower lobes.  -No pneumothorax. 10/16 CT abdomen and pelvis Wo contrast;-Large, RIGHT pelvic sidewall extraperitoneal hematoma -RIGHT lower posterior flank hematoma. -Multiple bilateral acute rib deformities are identified likely related to CPR. -Hepatic steatosis. 10/16 CT head  Wo contrast-negative acute infarct 10/17 transfused 2 units PRBC    I have personally reviewed and interpreted all radiology studies and my findings are as above.  VENTILATOR SETTINGS: Vent mode; PRVC Rate set; 28 Vt Set; 430 FiO2; 50% PEEP; 18    Cultures 10/13 urine pending 10/13 blood LEFT hand NGTD 10/13 blood LEFT wrist positive GPC 10/14 tracheal aspirate pending 10/14 MRSA PCR negative      Antimicrobials: Anti-infectives (From admission, onward)   Start     Stop   02/28/19 2200  vancomycin (VANCOCIN) 1,500 mg in sodium chloride 0.9 % 500 mL IVPB  Status:  Discontinued     02/28/19 1144   02/28/19 2000  vancomycin (VANCOCIN) IVPB 1000 mg/200 mL premix         02/28/19 1000  levofloxacin (LEVAQUIN) IVPB 750 mg  Status:  Discontinued     02/28/19 1144   02/27/19 2200  remdesivir 100 mg in sodium chloride 0.9 % 250 mL IVPB     03/03/19 1559   02/27/19 1000  vancomycin (VANCOCIN) 2,500 mg in sodium chloride 0.9 % 500 mL IVPB     02/27/19 1416   02/27/19 0930  levofloxacin (LEVAQUIN) IVPB 500 mg  Status:  Discontinued     02/27/19 0932   02/27/19 0000  remdesivir 200 mg in sodium chloride 0.9 % 250 mL IVPB     02/27/19 0039   02/25/2019 1915  levofloxacin (LEVAQUIN) IVPB 500 mg     02/14/2019 2138   03/12/2019 1900  cefTRIAXone (ROCEPHIN) 1 g in sodium chloride 0.9 % 100 mL IVPB  Status:  Discontinued     02/28/2019 1902       Devices   LINES / TUBES:      Continuous Infusions: . sodium chloride 50 mL/hr at 03/02/19 0700  . fentaNYL infusion INTRAVENOUS 175 mcg/hr (03/02/19 0700)  . norepinephrine (LEVOPHED) Adult infusion Stopped (02/27/19 1610)  . remdesivir 100 mg in NS 250 mL 500 mL/hr at 03/01/19 1800  . vancomycin Stopped (03/01/19 2112)     Objective: Vitals:   03/02/19 0450 03/02/19 0500 03/02/19 0600 03/02/19 0700  BP:  (!) 142/73 136/67 117/61  Pulse:  66 71 62  Resp:  (!) 30 (!) 28 (!) 25  Temp:  98.4 F (36.9 C) 98.4 F (36.9 C) 98.4 F (36.9 C)  TempSrc:      SpO2:  95% 97% 98%  Weight: (!) 136.1 kg     Height:        Intake/Output Summary (Last 24 hours) at 03/02/2019 9604 Last data filed at 03/02/2019 0700 Gross per 24 hour  Intake 2685.39 ml  Output 1505 ml  Net 1180.39 ml   Filed Weights   02/28/19 0425 03/01/19 0247 03/02/19 0450  Weight: 134.6 kg 135.6 kg (!) 136.1 kg   Physical Exam:  General: Sedated/intubated, partially opens eyes to painful stimuli, positive acute respiratory distress Eyes: negative scleral hemorrhage, negative anisocoria, negative icterus, no corneal reflex ENT: Negative Runny nose, negative gingival bleeding, #7.5 ETT tube in place Neck:  Negative scars, masses, torticollis, lymphadenopathy, JVD Lungs: Tachypneic, clear to auscultation  bilaterally without wheezes or crackles Cardiovascular: Regular rate and rhythm without murmur gallop or rub normal S1 and S2 Abdomen: MORBIDLY OBESE, negative abdominal pain, nondistended, decreased soft, bowel sounds, no rebound, no ascites, no appreciable mass Extremities: No significant cyanosis, clubbing, or edema bilateral lower extremities Skin: Negative rashes, lesions, ulcers Psychiatric: Unable to evaluate intubated/sedated/encephalopathy Central nervous system:   Physical Exam: Unable to evaluate sedated/intubated/encephalopathy       Data Reviewed: Care during the described time interval was provided by me .  I have reviewed this patient's available data, including medical history, events of note, physical examination, and all test results as part of my evaluation.   CBC: Recent Labs  Lab 03/09/2019 1742  02/27/19 0152 02/27/19 0243  02/28/19 0350 03/01/19 0329 03/01/19 0455 03/02/19 0333 03/02/19 0421  WBC 6.5   < > 8.9 8.3  --  9.9  --  9.6  --  12.8*  NEUTROABS 4.4  --   --   --   --  7.9*  --  7.2  --  9.4*  HGB 11.7*   < > 11.0* 10.5*   < > 8.7* 9.2* 7.7* 6.5* 6.8*  HCT 35.4*   < > 34.5* 33.2*   < >  27.4* 27.0* 23.8* 19.0* 21.3*  MCV 104.7*   < > 108.2* 107.4*  --  105.8*  --  105.3*  --  106.5*  PLT 145*   < > 137* 132*  --  153  --  180  --  193   < > = values in this interval not displayed.   Basic Metabolic Panel: Recent Labs  Lab 02/27/19 0142 02/27/19 0152 02/27/19 1056  02/28/19 0350 03/01/19 0329 03/01/19 0455 03/01/19 1545 03/02/19 0333 03/02/19 0421  NA  --  134* 134*   < > 138 138 138  --  136 139  K  --  3.2* 5.4*   < > 2.8* 3.5 3.3* 3.7 3.9 4.0  CL  --  97* 100  --  101  --  102  --   --  104  CO2  --  22 20*  --  22  --  23  --   --  24  GLUCOSE  --  218* 149*  --  151*  --  197*  --   --  186*  BUN  --  18 21  --  21  --  30*  --   --  38*  CREATININE  --  1.28* 1.43*  --  1.29*  --  1.57*  --   --  1.68*  CALCIUM  --  7.0* 7.6*  --   7.6*  --  8.1*  --   --  8.0*  MG 1.7  --   --   --  1.7  --  2.2 2.2  --  2.6*  PHOS  --   --   --   --  2.7  --  4.0  --   --  3.3   < > = values in this interval not displayed.   GFR: Estimated Creatinine Clearance: 37.4 mL/min (A) (by C-G formula based on SCr of 1.68 mg/dL (H)). Liver Function Tests: Recent Labs  Lab Mar 13, 2019 1742 02/27/19 0152 02/28/19 0350 03/01/19 0455 03/02/19 0421  AST 55* 182* 83* 67* 84*  ALT 35 75* 55* 51* 61*  ALKPHOS 48 52 40 38 41  BILITOT 0.9 1.4* 0.8 1.0 1.2  PROT 7.2 6.2* 5.3* 5.4* 5.4*  ALBUMIN 3.5 2.9* 2.5* 2.4* 2.4*   No results for input(s): LIPASE, AMYLASE in the last 168 hours. No results for input(s): AMMONIA in the last 168 hours. Coagulation Profile: Recent Labs  Lab 03/13/19 2215  INR 1.3*   Cardiac Enzymes: No results for input(s): CKTOTAL, CKMB, CKMBINDEX, TROPONINI in the last 168 hours. BNP (last 3 results) No results for input(s): PROBNP in the last 8760 hours. HbA1C: No results for input(s): HGBA1C in the last 72 hours. CBG: Recent Labs  Lab 03/01/19 1705 03/01/19 1952 03/02/19 0025 03/02/19 0438 03/02/19 0749  GLUCAP 206* 212* 208* 187* 191*   Lipid Profile: Recent Labs    02/28/19 0350  CHOL 134  HDL 35*  LDLCALC 65  TRIG 488*  CHOLHDL 3.8   Thyroid Function Tests: No results for input(s): TSH, T4TOTAL, FREET4, T3FREE, THYROIDAB in the last 72 hours. Anemia Panel: No results for input(s): VITAMINB12, FOLATE, FERRITIN, TIBC, IRON, RETICCTPCT in the last 72 hours. Urine analysis:    Component Value Date/Time   COLORURINE YELLOW 13-Mar-2019 1752   APPEARANCEUR HAZY (A) 03-13-2019 1752   LABSPEC 1.013 03-13-19 1752   PHURINE 6.0 03-13-2019 1752   GLUCOSEU NEGATIVE 03/13/19 1752   HGBUR MODERATE (A) 2019-03-13 1752  BILIRUBINUR NEGATIVE 03/16/2019 1752   KETONESUR 5 (A) 02/19/2019 1752   PROTEINUR 100 (A) 02/23/2019 1752   UROBILINOGEN 0.2 10/27/2006 1337   NITRITE NEGATIVE 03/06/2019 1752    LEUKOCYTESUR LARGE (A) 02/16/2019 1752   Sepsis Labs: @LABRCNTIP (procalcitonin:4,lacticidven:4)  ) Recent Results (from the past 240 hour(s))  SARS CORONAVIRUS 2 (TAT 6-24 HRS) Nasopharyngeal Nasopharyngeal Swab     Status: Abnormal   Collection Time: 03/09/2019  5:42 PM   Specimen: Nasopharyngeal Swab  Result Value Ref Range Status   SARS Coronavirus 2 POSITIVE (A) NEGATIVE Final    Comment: RESULT CALLED TO, READ BACK BY AND VERIFIED WITH: Zola ButtonINNAN J, RN AT [phone] ON 02/27/2019 BY SAINVILUS S (NOTE) SARS-CoV-2 target nucleic acids are DETECTED. The SARS-CoV-2 RNA is generally detectable in upper and lower respiratory specimens during the acute phase of infection. Positive results are indicative of active infection with SARS-CoV-2. Clinical  correlation with patient history and other diagnostic information is necessary to determine patient infection status. Positive results do  not rule out bacterial infection or co-infection with other viruses. The expected result is Negative. Fact Sheet for Patients: HairSlick.nohttps://www.fda.gov/media/138098/download Fact Sheet for Healthcare Providers: quierodirigir.comhttps://www.fda.gov/media/138095/download This test is not yet approved or cleared by the [place] FDA and  has been authorized for detection and/or diagnosis of SARS-CoV-2 by FDA under an Emergency Use Authorization (EUA). This EUA will remain  in effect (meaning this test can b e used) for the duration of the COVID-19 declaration under Section 564(b)(1) of the Act, 21 U.S.C. section 360bbb-3(b)(1), unless the authorization is terminated or revoked sooner. Performed at [facility], [address] [place], [place] [address]   Urine Culture     Status: None (Preliminary result)   Collection Time: 02/15/2019  5:52 PM   Specimen: Urine, Clean Catch  Result Value Ref Range Status   Specimen Description   Final    URINE, CLEAN CATCH Performed at [facility], [address] [place], [place] [address]    Special Requests   Final    NONE Performed at [facility], [address] [place], [place] [address]    Culture   Final    CULTURE REINCUBATED FOR BETTER GROWTH Performed at [facility], [address] [place], [place] [address]    Report Status PENDING  Incomplete  Blood culture (routine x 2)     Status: None (Preliminary result)   Collection Time: 02/14/2019  9:48 PM   Specimen: BLOOD  Result Value Ref Range Status   Specimen Description   Final    BLOOD BLOOD LEFT HAND Performed at [facility], [address] [place], [place] [address]    Special Requests   Final    BOTTLES DRAWN AEROBIC AND ANAEROBIC Blood Culture adequate volume Performed at [facility], [address] [place], [place] [address]    Culture   Final    NO GROWTH 4 DAYS Performed at [facility], [address] [place], [place] [address]    Report Status PENDING  Incomplete  Blood culture (routine x 2)     Status: None (Preliminary result)   Collection Time: 03/01/2019  9:48 PM   Specimen: BLOOD  Result Value Ref Range Status   Specimen Description   Final    BLOOD BLOOD LEFT WRIST Performed at [facility], [address] [place], [place] [address]    Special Requests   Final  BOTTLES DRAWN AEROBIC AND ANAEROBIC Blood Culture adequate volume Performed at [facility], [address] [place], [place] [address]    Culture  Setup Time   Final    AEROBIC BOTTLE ONLY GRAM POSITIVE COCCI CRITICAL RESULT CALLED TO, READ BACK BY AND VERIFIED WITH: S CHRISTY PHARMD 02/28/19 JDW 1723    Culture   Final    CULTURE REINCUBATED FOR BETTER GROWTH Performed at [facility], [address], [place], [place] [address]    Report Status PENDING  Incomplete  Culture, respiratory (non-expectorated)     Status: None (Preliminary result)   Collection Time: 02/27/19  11:30 AM   Specimen: Tracheal Aspirate; Respiratory  Result Value Ref Range Status   Specimen Description   Final    TRACHEAL ASPIRATE Performed at [facility], [address], [place], [place] [address]    Special Requests   Final    NONE Performed at [facility], [address] [place], [place] [address]    Gram Stain   Final    RARE WBC PRESENT,BOTH PMN AND MONONUCLEAR RARE GRAM POSITIVE COCCI IN PAIRS    Culture   Final    CULTURE REINCUBATED FOR BETTER GROWTH Performed at [facility], [address] [place], [place] [address]    Report Status PENDING  Incomplete  MRSA PCR Screening     Status: None   Collection Time: 02/27/19  5:20 PM   Specimen: Nasal Mucosa; Nasopharyngeal  Result Value Ref Range Status   MRSA by PCR NEGATIVE NEGATIVE Final    Comment:        The GeneXpert MRSA Assay (FDA approved for NASAL specimens only), is one component of a comprehensive MRSA colonization surveillance program. It is not intended to diagnose MRSA infection nor to guide or monitor treatment for MRSA infections. Performed at [facility], [address], [place], [place] [address]          Radiology Studies: Ct Abdomen Pelvis Wo Contrast  Result Date: 03/01/2019 CLINICAL DATA:  Status post CPR. Abdominal distension. EXAM: CT ABDOMEN AND PELVIS WITHOUT CONTRAST TECHNIQUE: Multidetector CT imaging of the abdomen and pelvis was performed following the standard protocol without IV contrast. COMPARISON:  None FINDINGS: Lower chest: Small right pleural effusion. Bilateral posterior pleural thickening. Patchy areas of ground-glass attenuation identified scattered throughout the imaged portions of the lungs. Hepatobiliary: Diffuse hepatic steatosis. No focal liver abnormality. The gallbladder is not visualized compatible with prior cholecystectomy. No biliary dilatation. Pancreas: Unremarkable. No pancreatic ductal  dilatation or surrounding inflammatory changes. Spleen: Normal in size without focal abnormality. Adrenals/Urinary Tract: Normal appearance of the adrenal glands. No kidney mass or hydronephrosis identified bilaterally. The urinary bladder is partially collapsed around a Foley catheter balloon. Stomach/Bowel: Stomach is within normal limits. Appendix appears normal. No evidence of bowel wall thickening, distention, or inflammatory changes. Vascular/Lymphatic: Aortic atherosclerosis. No aneurysm. No abdominopelvic adenopathy identified. Reproductive: Status post hysterectomy. No adnexal masses. Other: There is a large, right pelvic sidewall the extraperitoneal hematoma is identified measuring 5.6 x 18.1 cm. A small amount of hemorrhage extends into the right lower quadrant retroperitoneum. Musculoskeletal: Bones are diffusely osteopenic diminishing detail. Multiple bilateral acute rib deformities are identified likely related to CPR. Thoracolumbar degenerative disc disease. No pelvic bone fracture identified. Right lower posterior flank a hematoma is identified measuring 9.8 x 4.7 by 5.3 cm. IMPRESSION: 1. Large, right pelvic sidewall extraperitoneal hematoma is identified. 2. Right lower posterior flank  hematoma. 3. Multiple bilateral acute rib deformities are identified likely related to CPR. 4. Hepatic steatosis. Aortic Atherosclerosis (ICD10-I70.0). Electronically Signed   By: Kerby Moors M.D.   On: 03/01/2019 15:00   Ct Head Wo Contrast  Result Date: 03/01/2019 CLINICAL DATA:  Focal neurologic deficit. EXAM: CT HEAD WITHOUT CONTRAST TECHNIQUE: Contiguous axial images were obtained from the base of the skull through the vertex without intravenous contrast. COMPARISON:  CT head dated 02/27/2019. FINDINGS: Brain: No evidence of acute infarction, hemorrhage, hydrocephalus, extra-axial collection or mass lesion/mass effect. There is mild cerebral volume loss with associated ex vacuo dilatation. Vascular:  There are vascular calcifications in the carotid siphons. Skull: Normal. Negative for fracture or focal lesion. Sinuses/Orbits: There is mild ethmoid sinus disease. Other: A nasoenteric tube is partially imaged. An endotracheal tube is partially imaged. IMPRESSION: No acute intracranial process. Electronically Signed   By: Zerita Boers M.D.   On: 03/01/2019 14:47   Dg Chest Port 1 View  Result Date: 02/28/2019 CLINICAL DATA:  Flail chest. EXAM: PORTABLE CHEST 1 VIEW COMPARISON:  02/27/2019. FINDINGS: Endotracheal tube, NG tube, right PICC line in stable position. Stable cardiomegaly. Bilateral pulmonary infiltrates are again noted. Slight improvement in right upper lung infiltrate. Small left pleural effusion cannot be excluded. No pneumothorax. IMPRESSION: 1.  Lines and tubes in stable position. 2. Bilateral pulmonary infiltrates are again noted. Slight improvement in right upper lung infiltrate. Small left pleural effusion cannot be excluded. 3.  Stable cardiomegaly. Electronically Signed   By: Scissors   On: 02/28/2019 15:33        Scheduled Meds: . sodium chloride   Intravenous Once  . aspirin EC  81 mg Oral Daily  . chlorhexidine  15 mL Mouth/Throat BID  . Chlorhexidine Gluconate Cloth  6 each Topical Daily  . Chlorhexidine Gluconate Cloth  6 each Topical Q0600  . dexamethasone (DECADRON) injection  8 mg Intravenous Q12H  . famotidine  20 mg Oral Daily  . feeding supplement (PRO-STAT SUGAR FREE 64)  30 mL Per Tube QID  . feeding supplement (VITAL AF 1.2 CAL)  1,000 mL Per Tube Q24H  . fentaNYL (SUBLIMAZE) injection  25 mcg Intravenous Once  . insulin aspart  0-9 Units Subcutaneous Q4H  . lidocaine  1 patch Transdermal Q24H  . mouth rinse  15 mL Mouth Rinse 10 times per day  . mupirocin ointment   Nasal BID  . sodium chloride flush  10-40 mL Intracatheter Q12H   Continuous Infusions: . sodium chloride 50 mL/hr at 03/02/19 0700  . fentaNYL infusion INTRAVENOUS 175 mcg/hr  (03/02/19 0700)  . norepinephrine (LEVOPHED) Adult infusion Stopped (02/27/19 0626)  . remdesivir 100 mg in NS 250 mL 500 mL/hr at 03/01/19 1800  . vancomycin Stopped (03/01/19 2112)     LOS: 4 days   The patient is critically ill with multiple organ systems failure and requires high complexity decision making for assessment and support, frequent evaluation and titration of therapies, application of advanced monitoring technologies and extensive interpretation of multiple databases. Critical Care Time devoted to patient care services described in this note  Time spent: 40 minutes     King Pinzon, Geraldo Docker, MD [facility] Pager [phone]  If 7PM-7AM, please contact night-coverage [email or web address] Password [email or web address] 03/02/2019, 8:07 AM

## 2019-03-02 NOTE — Progress Notes (Signed)
NAME:  [name] [name], MRN:  [id number], DOB:  13-Jun-1940, LOS: 4 ADMISSION DATE:  23-Mar-2019, CONSULTATION DATE:  10/15 REFERRING MD:  EDP, CHIEF COMPLAINT:  dyspnea   Brief History   78 y/o female admitted on 10/13 to [facility] with dyspnea, had a PEA arrest in the ED requriing intubation, 3 doses of epinephrine with CPR.  Past Medical History  "inflammatory" arthritis Ascending aorta dilation HTN MR Obesity Atrial fibrillatino, paroxysmal Polymyalgia rheumatica  Significant Hospital Events   Cardiac arrest 10/13, admission 10/14 transfer to [facility] 10/16 hemoglobin down, some pink urine but no frank bleeding identified  Consults:  PCCM  Procedures:  ETT 10/13>   Significant Diagnostic Tests:  10/13 CT head> NAICP 10/13 CT angiogram chest> no PE, patchy infiltrates, rib fractures 10/16 CT head > NAICP 10/16 CT abdomen/pelvis> large R pelvic sidewall extraperitoneal hematoma, RLL posterior flank hematoma, multiple rib fractures, hepatic steatosis  Micro Data:  10/13 SARS COV2 > positive 10/13 blood >  10/13 Urine >  10/14 resp > GPC in pairs  Antimicrobials/COVID Rx  10/13 Decadron >  10/13 Remdesivir >  10/13 Levaquin >   Interim history/subjective:  10/16 hemoglobin down, some pink urine but no frank bleeding identified Remains encephalopathic, minimal response on ventilator  Objective   Blood pressure 137/68, pulse 76, temperature 98.1 F (36.7 C), resp. rate (!) 31, height 5\' 4"  (1.626 m), weight (!) 136.1 kg, SpO2 95 %.    Vent Mode: PCV FiO2 (%):  [40 %-60 %] 40 % Set Rate:  [28 bmp] 28 bmp PEEP:  [8 GLO75-64 cmH20] 18 cmH20 Plateau Pressure:  [23 cmH20-30 cmH20] 30 cmH20   Intake/Output Summary (Last 24 hours) at 03/02/2019 1951 Last data filed at 03/02/2019 1900 Gross per 24 hour  Intake 2829.31 ml  Output 1935 ml  Net 894.31 ml   Filed Weights   02/28/19 0425 03/01/19 0247 03/02/19 0450  Weight: 134.6 kg 135.6 kg (!) 136.1 kg    Examination:   General:  In bed on vent HENT: NCAT ETT in place PULM: CTA B, vent supported breathing CV: RRR, no mgr GI: BS+, soft, nontender MSK: normal bulk and tone Neuro: sedated on vent  10/14 CXR images personally reviewed: R> L bilateral airspace disease/consolidation  Resolved Hospital Problem list     Assessment & Plan:  ARDS due to COVID 10 pneumonia Ventilator dyssynchrony, air hunger Continue mechanical ventilation per ARDS protocol Target TVol 6-8cc/kgIBW Target Plateau Pressure < 30cm H20 Target driving pressure less than 15 cm of water Target PaO2 55-65: titrate PEEP/FiO2 per protocol As long as PaO2 to FiO2 ratio is less than 1:150 position in prone position for 16 hours a day Check CVP daily if CVL in place Target CVP less than 4, diurese as necessary Ventilator associated pneumonia prevention protocol 10/16 plan : no proning with flail chest  Bleeding, R flank sidewall hematoma CT abdomen to look for RP bleed (done) Hold heparin Supportive care Monitor for bleeding Transfuse PRBC for Hgb < 7 gm/dL  AKI: Monitor BMET and UOP Replace electrolytes as needed Would not administer IV fluids given ARDS  Flail chest: Pain control Supportive care no proning  Need for sedation: Fentanyl infusion Prn versed Daily WUA RASS goal -1 to -2 Still minimally responsive, coughing and gagging.   Cardiac arrest, acute encephalopathy, at risk for hypoxemic injury: Minimize sedation as able (difficult though given ventilator dyssynchrony, air hunger) CT head today  Atrial fibrillation Tele Heparin infusion > on hold with bleeding  Prognosis:  poor   Best practice:  Diet: start tube feeding Pain/Anxiety/Delirium protocol (if indicated): yes, as above VAP protocol (if indicated): yes DVT prophylaxis: heparin drip GI prophylaxis: famotidine Glucose control: SSI Mobility: bed rest Code Status: limited code, no CPR if arrest again Family [facility]  Disposition: remain in ICU  Labs   CBC: Recent Labs  Lab May 15, 2019 1742  02/27/19 0152 02/27/19 0243  02/28/19 0350 03/01/19 0329 03/01/19 0455 03/02/19 0333 03/02/19 0421  WBC 6.5   < > 8.9 8.3  --  9.9  --  9.6  --  12.8*  NEUTROABS 4.4  --   --   --   --  7.9*  --  7.2  --  9.4*  HGB 11.7*   < > 11.0* 10.5*   < > 8.7* 9.2* 7.7* 6.5* 6.8*  HCT 35.4*   < > 34.5* 33.2*   < > 27.4* 27.0* 23.8* 19.0* 21.3*  MCV 104.7*   < > 108.2* 107.4*  --  105.8*  --  105.3*  --  106.5*  PLT 145*   < > 137* 132*  --  153  --  180  --  193   < > = values in this interval not displayed.    Basic Metabolic Panel: Recent Labs  Lab 02/27/19 0142 02/27/19 0152 02/27/19 1056  02/28/19 0350 03/01/19 0329 03/01/19 0455 03/01/19 1545 03/02/19 0333 03/02/19 0421  NA  --  134* 134*   < > 138 138 138  --  136 139  K  --  3.2* 5.4*   < > 2.8* 3.5 3.3* 3.7 3.9 4.0  CL  --  97* 100  --  101  --  102  --   --  104  CO2  --  22 20*  --  22  --  23  --   --  24  GLUCOSE  --  218* 149*  --  151*  --  197*  --   --  186*  BUN  --  18 21  --  21  --  30*  --   --  38*  CREATININE  --  1.28* 1.43*  --  1.29*  --  1.57*  --   --  1.68*  CALCIUM  --  7.0* 7.6*  --  7.6*  --  8.1*  --   --  8.0*  MG 1.7  --   --   --  1.7  --  2.2 2.2  --  2.6*  PHOS  --   --   --   --  2.7  --  4.0  --   --  3.3   < > = values in this interval not displayed.   GFR: Estimated Creatinine Clearance: 37.4 mL/min (A) (by C-G formula based on SCr of 1.68 mg/dL (H)). Recent Labs  Lab May 15, 2019 1929  May 15, 2019 2224 02/27/19 0152 02/27/19 0243 02/28/19 0350 03/01/19 0455 03/02/19 0421  PROCALCITON  --   --  12.80 44.96  --  65.54  --   --   WBC  --    < >  --  8.9 8.3 9.9 9.6 12.8*  LATICACIDVEN 1.2  --   --   --   --   --   --   --    < > = values in this interval not displayed.    Liver Function Tests: Recent Labs  Lab May 15, 2019 1742 02/27/19 0152 02/28/19 0350 03/01/19 0455 03/02/19 0421  AST 55* 182* 83* 67*  84*  ALT 35 75* 55* 51* 61*  ALKPHOS 48 52 40 38 41  BILITOT 0.9 1.4* 0.8 1.0 1.2  PROT 7.2 6.2* 5.3* 5.4* 5.4*  ALBUMIN 3.5 2.9* 2.5* 2.4* 2.4*   No results for input(s): LIPASE, AMYLASE in the last 168 hours. No results for input(s): AMMONIA in the last 168 hours.  ABG    Component Value Date/Time   PHART 7.395 03/02/2019 0333   PCO2ART 40.5 03/02/2019 0333   PO2ART 59.0 (L) 03/02/2019 0333   HCO3 24.8 03/02/2019 0333   TCO2 26 03/02/2019 0333   ACIDBASEDEF 2.0 02/28/2019 0349   O2SAT 90.0 03/02/2019 0333     Coagulation Profile: Recent Labs  Lab 03/04/2019 2215  INR 1.3*    Cardiac Enzymes: No results for input(s): CKTOTAL, CKMB, CKMBINDEX, TROPONINI in the last 168 hours.  HbA1C: Hgb A1c MFr Bld  Date/Time Value Ref Range Status  02/27/2019 02:43 AM 6.0 (H) 4.8 - 5.6 % Final    Comment:    (NOTE) Pre diabetes:          5.7%-6.4% Diabetes:              >6.4% Glycemic control for   <7.0% adults with diabetes     CBG: Recent Labs  Lab 03/02/19 0438 03/02/19 0749 03/02/19 1213 03/02/19 1711 03/02/19 1937  GLUCAP 187* 191* 200* 216* 229*     Critical care time: 35 minutes

## 2019-03-03 DIAGNOSIS — R7881 Bacteremia: Secondary | ICD-10-CM | POA: Diagnosis present

## 2019-03-03 DIAGNOSIS — J1289 Other viral pneumonia: Secondary | ICD-10-CM

## 2019-03-03 DIAGNOSIS — J9621 Acute and chronic respiratory failure with hypoxia: Secondary | ICD-10-CM | POA: Diagnosis not present

## 2019-03-03 DIAGNOSIS — I469 Cardiac arrest, cause unspecified: Secondary | ICD-10-CM | POA: Diagnosis not present

## 2019-03-03 DIAGNOSIS — U071 COVID-19: Secondary | ICD-10-CM | POA: Diagnosis not present

## 2019-03-03 DIAGNOSIS — I5043 Acute on chronic combined systolic (congestive) and diastolic (congestive) heart failure: Secondary | ICD-10-CM | POA: Diagnosis not present

## 2019-03-03 DIAGNOSIS — I77810 Thoracic aortic ectasia: Secondary | ICD-10-CM | POA: Diagnosis not present

## 2019-03-03 DIAGNOSIS — G9340 Encephalopathy, unspecified: Secondary | ICD-10-CM | POA: Diagnosis not present

## 2019-03-03 LAB — BPAM RBC
Blood Product Expiration Date: 202011112359
Blood Product Expiration Date: 202011112359
ISSUE DATE / TIME: 202010171013
ISSUE DATE / TIME: 202010171013
Unit Type and Rh: 6200
Unit Type and Rh: 6200

## 2019-03-03 LAB — GLUCOSE, CAPILLARY
Glucose-Capillary: 242 mg/dL — ABNORMAL HIGH (ref 70–99)
Glucose-Capillary: 302 mg/dL — ABNORMAL HIGH (ref 70–99)
Glucose-Capillary: 257 mg/dL — ABNORMAL HIGH (ref 70–99)
Glucose-Capillary: 238 mg/dL — ABNORMAL HIGH (ref 70–99)
Glucose-Capillary: 231 mg/dL — ABNORMAL HIGH (ref 70–99)

## 2019-03-03 LAB — CULTURE, BLOOD (ROUTINE X 2)
Culture: NO GROWTH
Special Requests: ADEQUATE

## 2019-03-03 LAB — COMPREHENSIVE METABOLIC PANEL
ALT: 67 U/L — ABNORMAL HIGH (ref 0–44)
AST: 79 U/L — ABNORMAL HIGH (ref 15–41)
Albumin: 2.2 g/dL — ABNORMAL LOW (ref 3.5–5.0)
Alkaline Phosphatase: 41 U/L (ref 38–126)
Anion gap: 9 (ref 5–15)
BUN: 44 mg/dL — ABNORMAL HIGH (ref 8–23)
CO2: 23 mmol/L (ref 22–32)
Calcium: 7.6 mg/dL — ABNORMAL LOW (ref 8.9–10.3)
Chloride: 109 mmol/L (ref 98–111)
Creatinine, Ser: 1.47 mg/dL — ABNORMAL HIGH (ref 0.44–1.00)
GFR calc Af Amer: 39 mL/min — ABNORMAL LOW (ref >60)
GFR calc non Af Amer: 34 mL/min — ABNORMAL LOW (ref >60)
Glucose, Bld: 245 mg/dL — ABNORMAL HIGH (ref 70–99)
Potassium: 3.6 mmol/L (ref 3.5–5.1)
Sodium: 141 mmol/L (ref 135–145)
Total Bilirubin: 0.8 mg/dL (ref 0.3–1.2)
Total Protein: 4.9 g/dL — ABNORMAL LOW (ref 6.5–8.1)

## 2019-03-03 LAB — CBC WITH DIFFERENTIAL/PLATELET
Abs Immature Granulocytes: 0.96 10*3/uL — ABNORMAL HIGH (ref 0.00–0.07)
Basophils Absolute: 0.1 10*3/uL (ref 0.0–0.1)
Basophils Relative: 1 %
Eosinophils Absolute: 0 10*3/uL (ref 0.0–0.5)
Eosinophils Relative: 0 %
HCT: 26.3 % — ABNORMAL LOW (ref 36.0–46.0)
Hemoglobin: 8.4 g/dL — ABNORMAL LOW (ref 12.0–15.0)
Immature Granulocytes: 8 %
Lymphocytes Relative: 6 %
Lymphs Abs: 0.8 10*3/uL (ref 0.7–4.0)
MCH: 31.8 pg (ref 26.0–34.0)
MCHC: 31.9 g/dL (ref 30.0–36.0)
MCV: 99.6 fL (ref 80.0–100.0)
Monocytes Absolute: 1.4 10*3/uL — ABNORMAL HIGH (ref 0.1–1.0)
Monocytes Relative: 12 %
Neutro Abs: 9 10*3/uL — ABNORMAL HIGH (ref 1.7–7.7)
Neutrophils Relative %: 73 %
Platelets: 196 10*3/uL (ref 150–400)
RBC: 2.64 MIL/uL — ABNORMAL LOW (ref 3.87–5.11)
RDW: 18.6 % — ABNORMAL HIGH (ref 11.5–15.5)
WBC: 12.3 10*3/uL — ABNORMAL HIGH (ref 4.0–10.5)
nRBC: 0.8 % — ABNORMAL HIGH (ref 0.0–0.2)

## 2019-03-03 LAB — TYPE AND SCREEN
ABO/RH(D): A POS
Antibody Screen: NEGATIVE
Unit division: 0
Unit division: 0

## 2019-03-03 LAB — LIPID PANEL
Cholesterol: 115 mg/dL (ref 0–200)
HDL: 28 mg/dL — ABNORMAL LOW (ref >40)
LDL Cholesterol: 52 mg/dL (ref 0–99)
Total CHOL/HDL Ratio: 4.1 RATIO
Triglycerides: 174 mg/dL — ABNORMAL HIGH (ref <150)
VLDL: 35 mg/dL (ref 0–40)

## 2019-03-03 LAB — MAGNESIUM: Magnesium: 2.5 mg/dL — ABNORMAL HIGH (ref 1.7–2.4)

## 2019-03-03 LAB — URINE CULTURE: Culture: >=100000 — AB

## 2019-03-03 LAB — D-DIMER, QUANTITATIVE: D-Dimer, Quant: 2.71 ug/mL-FEU — ABNORMAL HIGH (ref 0.00–0.50)

## 2019-03-03 LAB — C-REACTIVE PROTEIN: CRP: 2.7 mg/dL — ABNORMAL HIGH (ref <1.0)

## 2019-03-03 LAB — PHOSPHORUS: Phosphorus: 3.4 mg/dL (ref 2.5–4.6)

## 2019-03-03 MED ORDER — INSULIN ASPART 100 UNIT/ML ~~LOC~~ SOLN
0.0000 [IU] | SUBCUTANEOUS | Status: DC
  Administered 2019-03-03 (×2): 7 [IU] via SUBCUTANEOUS
  Administered 2019-03-03: 13:00:00 11 [IU] via SUBCUTANEOUS
  Administered 2019-03-03: 15 [IU] via SUBCUTANEOUS
  Administered 2019-03-04: 7 [IU] via SUBCUTANEOUS
  Administered 2019-03-04: 4 [IU] via SUBCUTANEOUS
  Administered 2019-03-04 (×2): 7 [IU] via SUBCUTANEOUS
  Administered 2019-03-04: 4 [IU] via SUBCUTANEOUS
  Administered 2019-03-04 – 2019-03-05 (×3): 7 [IU] via SUBCUTANEOUS
  Administered 2019-03-05 (×4): 4 [IU] via SUBCUTANEOUS
  Administered 2019-03-06: 3 [IU] via SUBCUTANEOUS
  Administered 2019-03-06 (×2): 4 [IU] via SUBCUTANEOUS
  Administered 2019-03-06: 7 [IU] via SUBCUTANEOUS
  Administered 2019-03-06 – 2019-03-07 (×6): 4 [IU] via SUBCUTANEOUS
  Administered 2019-03-07: 7 [IU] via SUBCUTANEOUS

## 2019-03-03 MED ORDER — LIDOCAINE HCL 1 % IJ SOLN
20.0000 mL | Freq: Once | INTRAMUSCULAR | Status: DC
  Filled 2019-03-03: qty 20

## 2019-03-03 MED ORDER — DEXAMETHASONE SODIUM PHOSPHATE 10 MG/ML IJ SOLN
6.0000 mg | INTRAMUSCULAR | Status: DC
  Administered 2019-03-04 – 2019-03-07 (×4): 6 mg via INTRAVENOUS
  Filled 2019-03-03 (×4): qty 1

## 2019-03-03 MED ORDER — POTASSIUM CHLORIDE 10 MEQ/100ML IV SOLN
10.0000 meq | INTRAVENOUS | Status: AC
  Administered 2019-03-03 – 2019-03-04 (×5): 10 meq via INTRAVENOUS
  Filled 2019-03-03 (×5): qty 100

## 2019-03-03 MED ORDER — CARVEDILOL 12.5 MG PO TABS
12.5000 mg | ORAL_TABLET | Freq: Two times a day (BID) | ORAL | Status: DC
  Administered 2019-03-03 – 2019-03-04 (×2): 12.5 mg via ORAL
  Filled 2019-03-03 (×6): qty 1

## 2019-03-03 MED ORDER — FUROSEMIDE 10 MG/ML IJ SOLN
20.0000 mg | Freq: Once | INTRAMUSCULAR | Status: AC
  Administered 2019-03-03: 17:00:00 20 mg via INTRAVENOUS
  Filled 2019-03-03: qty 2

## 2019-03-03 MED ORDER — SODIUM CHLORIDE 0.9 % IV SOLN
2.0000 g | INTRAVENOUS | Status: DC
  Administered 2019-03-03 – 2019-03-07 (×5): 2 g via INTRAVENOUS
  Filled 2019-03-03 (×5): qty 20

## 2019-03-03 NOTE — Progress Notes (Signed)
PROGRESS NOTE    [name] [name]  ZOX:[id number] DOB: 1940/12/04 DOA: 2019/03/23 PCP: Leanora Ivanoff., MD   Brief Narrative:  78 year old WF PMHx ascending aorta dilation, Chronic Systolic and Diastolic CHF, dilated cardiomyopathy, essential HTN, paroxysmal atrial fibrillation, mitral valve regurgitation moderate, polymyalgia rheumatica, chronic respiratory failure with hypoxia on 2 L O2 via [place] at home.  diagnosed as being Covid positive 10/12 presented with increased cough shortness of breath & hypoxia requiring 12L O2 , while being initially evaluated in the emergency room patient had a pulseless arrest.  Rhythm was asystole . Patient responded 3 doses of epinephrine during CPR with return of rhythm and blood pressure   Subjective: 10/18 intubated/sedated, partially opens eyes to painful stimuli    Assessment & Plan:   Active Problems:   PAF (paroxysmal atrial fibrillation) (HCC)   HTN (hypertension)   DCM (dilated cardiomyopathy) (HCC)   IFG (impaired fasting glucose)   Morbid obesity (HCC)   Mitral regurgitation   Polymyalgia rheumatica (HCC)   Ascending aorta dilatation (HCC)   Respiratory arrest (HCC)   Acute on chronic respiratory failure with hypoxia (HCC)   Pneumonia due to COVID-19 virus   Acute on chronic combined systolic and diastolic CHF (congestive heart failure) (HCC)   Acute respiratory distress syndrome (ARDS) due to COVID-19 virus (HCC)   Aspiration pneumonia of both lower lobes due to gastric secretions (HCC)   Flail chest   Cardiac arrest (HCC)   Acute encephalopathy   Abdominal distention   Bleeding behind the abdominal cavity   Bacteremia  Patient evaluated by PCCM today plan below.  Covid 19 pneumonia/Acute on Chronic respiratory failure with hypoxia -Decadron 8 mg BID x 10 days -Remdesivir per pharmacy protocol -Combivent QID  -10/17 discontinue fentanyl.  Sedation holiday -10/18 yesterday when fentanyl discontinued patient's BP became  extremely elevated, became dyssynchronous with vent.  Increased fentanyl drip for comfort.  ARDS -Lung protective strategy -Currently no need for paralytic -1016 Dr. Carolyne Littles discussed goals of care with husband.  See family communication -10/16 will decide on proning patient after obtaining head CT. -10/17 patient with continued blood loss, most likely extension of right side hematomas hold on Proning. -10/18 although appears hematoma stable for now unable to prone secondary to flail chest  Aspiration pneumonia- -Multiple admission chest x-ray arrest episodes -10/15 Tracheal aspirate normal flora  Flail chest -H& P reported flail chest, all review of PCXR unable to appreciate any fractured ribs and not mentioned by radiologist. -If present most likely secondary to CPR when patient coded -If present will increase weaning difficulty. -If present will require significant pain control/paralytics to eliminate vent dyssynchrony especially if patient is prone. -10/15 repeat PCXR; radiologist did not comment on flail chest.. -10/16 discussed case with radiologist patient has multiple bilateral rib fractures observed on her CT scan.  Status post Asystolic Arrest.   -Etiology of this is unclear.  May have been related to extreme hypoxia -10/14 echocardiogram shows worsening heart function see results below.    Acute on chronic systolic and diastolic CHF -Significantly reduced EF.  EF = 55% from echocardiogram 06/29/2018.  EF now 25 to 30%-also on previous echocardiogram mitral regurgitation read as moderate, now read as trace regurgitation.  -Patient cardiologist Dr. Armanda Magic.  Will consult cardiology, may need ICD placement once stable -Discussed case with Dr. Sherlean Foot cardiology.  She reviewed patient's chart, recommended medical management at this time.  See note from 10/15  Paroxysmal atrial fibrillation -Was on Eliquis (hold) while  hospitalized. -10/16 Heparin IV   (Hold) see abdominal bleed  Bacteremia/positive Facklamia Hominis -10/15 given patient's poor prognosis will restart empiric vancomycin until cultures speciate -10/18 discussed case with ID Dr. Felipe Drone recommended ceftriaxone daily x7 days  Acute encephalopathy -Multifactorial, anoxic vs Covid 19 infection vs infection vs sedation. -Treat underlying cause  -EEG shows no seizure activity.  However shows diffuse encephalopathy see results below.- -Have been recommended by admitting physician use [place] sun for cooling protocol, however patient outside window upon admission to [facility]. -10/16 patient on minimal sedation no response to painful stimuli. -CT head negative acute findings see results below -See Covid pneumonia.  Abdominal distention -KUB negative for obstruction.  See results below -OG tube to low intermittent suction overnight, did not decompress patient.  This may be patient's body normal habitus.  Abdominal bleed -10/16 discussed case with radiologist abdominal/pelvic CT showed RIGHT side extraperitoneal hematoma and RIGHT flank hematoma -10/16 see A. Fib -10/17 transfused 2 units PRBC Recent Labs  Lab 03/01/19 0329 03/01/19 0455 03/02/19 0333 03/02/19 0421 03/03/19 0500  HGB 9.2* 7.7* 6.5* 6.8* 8.4*  -Currently appears stable  Hx chronic polymyalgia rheumatica -On chronic prednisone 5 to 20 mg daily. -Currently on Decadron for Covid pneumonia which will cover her chronic steroid use.   Hypokalemia -Potassium goal>4 -Potassium IV 50 mEq  Prediabetic -10/14 Hemoglobin A1c=6.0 -Lipid panel pending -10/18 resistant SSI   DVT prophylaxis: Heparin Code Status: Partial Family Communication: 10/18 spoke with husband counseled him on plan of care answered all questions  Disposition Plan: TBD   Consultants:  PCCM Phone consult cardiology Phone consult ID Dr. Felipe Drone     Procedures/Significant Events:  Head CT 10/13 >> neg CT angio chest 10/13 >>  neg PE, consolidation bilateral, with patchy infiltrates,  mildly displaced anterior rib fractures: Bilateral ribs 2, 3, 4, 5 (up to moderately displaced on series 11, image 75), 6, 7, and 8. Additional lateral 6th 7th and 8th right rib fractures (flail segment) with similar displacement 10/14 echocardiogram;Left Ventricle: EF= 25 to 30%. -The left ventricle has severely decreased function.  -Left ventricular diastolic Doppler parameters are consistent with pseudonormalization pattern of LV diastolic filling. Diffuse hypokinesis. Right Ventricle:  moderately enlarged. No increase in right ventricular wall thickness.  -Global RV systolic function is has moderately reduced systolic function. The tricuspid regurgitant velocity is 2.43 m/s, and with an assumed right atrial pressure of 8 mmHg, the estimated right ventricular systolic pressure is mildly elevated at 31.6 mmHg. Mitral Valve: The mitral valve is normal in structure. Trace mitral valve regurgitation. 10/14 EEG-;suggestive of profound diffuse encephalopathy, which could be secondary to sedated state, diffuse anoxic/hypoxic injury.  -No seizures or epileptiform discharges were seen  10/14 KUB; nonobstructive bowel gas pattern 10/14 PCXR;-mild cardiomegaly. -Fairly extensive bilateral interstitial and ground-glass opacities in addition to multifocal consolidations, interval worsening at the right upper lobe and bilateral lower lobes.  -No pneumothorax. 10/16 CT abdomen and pelvis Wo contrast;-Large, RIGHT pelvic sidewall extraperitoneal hematoma -RIGHT lower posterior flank hematoma. -Multiple bilateral acute rib deformities are identified likely related to CPR. -Hepatic steatosis. 10/16 CT head  Wo contrast-negative acute infarct 10/17 transfused 2 units PRBC    I have personally reviewed and interpreted all radiology studies and my findings are as above.  VENTILATOR SETTINGS: Vent mode; PCV Rate set; 28 Vt Set;  FiO2; 50% PEEP;  14    Cultures 10/13 urine pending 10/13 blood LEFT hand NGTD 10/13 blood LEFT wrist positive Facklamia Hominis 10/14 tracheal aspirate  normal flora 10/14 MRSA PCR negative      Antimicrobials: Anti-infectives (From admission, onward)   Start     Stop   03/03/19 1400  cefTRIAXone (ROCEPHIN) 2 g in sodium chloride 0.9 % 100 mL IVPB     03/10/19 1359   02/28/19 2200  vancomycin (VANCOCIN) 1,500 mg in sodium chloride 0.9 % 500 mL IVPB  Status:  Discontinued     02/28/19 1144   02/28/19 2000  vancomycin (VANCOCIN) IVPB 1000 mg/200 mL premix  Status:  Discontinued     03/03/19 1300   02/28/19 1000  levofloxacin (LEVAQUIN) IVPB 750 mg  Status:  Discontinued     02/28/19 1144   02/27/19 2200  remdesivir 100 mg in sodium chloride 0.9 % 250 mL IVPB     03/02/19 1753   02/27/19 1000  vancomycin (VANCOCIN) 2,500 mg in sodium chloride 0.9 % 500 mL IVPB     02/27/19 1416   02/27/19 0930  levofloxacin (LEVAQUIN) IVPB 500 mg  Status:  Discontinued     02/27/19 0932   02/27/19 0000  remdesivir 200 mg in sodium chloride 0.9 % 250 mL IVPB     02/27/19 0039   03-17-19 1915  levofloxacin (LEVAQUIN) IVPB 500 mg     03/17/2019 2138   03/17/19 1900  cefTRIAXone (ROCEPHIN) 1 g in sodium chloride 0.9 % 100 mL IVPB  Status:  Discontinued     03-17-19 1902       Devices   LINES / TUBES:      Continuous Infusions: . cefTRIAXone (ROCEPHIN)  IV 2 g (03/03/19 1526)  . fentaNYL infusion INTRAVENOUS 100 mcg/hr (03/03/19 1200)  . norepinephrine (LEVOPHED) Adult infusion Stopped (02/27/19 1610)  . potassium chloride       Objective: Vitals:   03/03/19 1600 03/03/19 1621 03/03/19 1700 03/03/19 1800  BP: (!) 165/119 (!) 150/62 (!) 143/70 140/70  Pulse: 70 69 61 64  Resp: (!) 36  13 13  Temp:      TempSrc:      SpO2: 95%  94% 92%  Weight:      Height:        Intake/Output Summary (Last 24 hours) at 03/03/2019 1849 Last data filed at 03/03/2019 1200 Gross per 24 hour  Intake 1586.84  ml  Output 1410 ml  Net 176.84 ml   Filed Weights   03/01/19 0247 03/02/19 0450 03/03/19 0500  Weight: 135.6 kg (!) 136.1 kg (!) 148.9 kg   Physical Exam:  General: Sedated/intubated, partially opens eyes to painful stimuli, positive acute respiratory distress Eyes: negative scleral hemorrhage, negative anisocoria, negative icterus ENT: Negative Runny nose, negative gingival bleeding, #7.5 ETT in place Neck:  Negative scars, masses, torticollis, lymphadenopathy, JVD Lungs: Tachypnea, clear to auscultation bilaterally without wheezes or crackles Cardiovascular: Regular rate and rhythm without murmur gallop or rub normal S1 and S2.  Multiple bilateral rib fractures (flail chest) Abdomen: MORBIDLY OBESE negative abdominal pain, nondistended, positive soft, bowel sounds, no rebound, no ascites, no appreciable mass Extremities: No significant cyanosis, clubbing, or edema bilateral lower extremities Skin: Negative rashes, lesions, ulcers Psychiatric: Intubated/sedated Central nervous system: Intubated/sedated        Data Reviewed: Care during the described time interval was provided by me .  I have reviewed this patient's available data, including medical history, events of note, physical examination, and all test results as part of my evaluation.   CBC: Recent Labs  Lab Mar 17, 2019 1742  02/27/19 0243  02/28/19 0350 03/01/19 0329 03/01/19 0455  03/02/19 0333 03/02/19 0421 03/03/19 0500  WBC 6.5   < > 8.3  --  9.9  --  9.6  --  12.8* 12.3*  NEUTROABS 4.4  --   --   --  7.9*  --  7.2  --  9.4* 9.0*  HGB 11.7*   < > 10.5*   < > 8.7* 9.2* 7.7* 6.5* 6.8* 8.4*  HCT 35.4*   < > 33.2*   < > 27.4* 27.0* 23.8* 19.0* 21.3* 26.3*  MCV 104.7*   < > 107.4*  --  105.8*  --  105.3*  --  106.5* 99.6  PLT 145*   < > 132*  --  153  --  180  --  193 196   < > = values in this interval not displayed.   Basic Metabolic Panel: Recent Labs  Lab 02/27/19 1056  02/28/19 0350 03/01/19 0329 03/01/19  0455 03/01/19 1545 03/02/19 0333 03/02/19 0421 03/03/19 0500 03/03/19 0848  NA 134*   < > 138 138 138  --  136 139  --  141  K 5.4*   < > 2.8* 3.5 3.3* 3.7 3.9 4.0  --  3.6  CL 100  --  101  --  102  --   --  104  --  109  CO2 20*  --  22  --  23  --   --  24  --  23  GLUCOSE 149*  --  151*  --  197*  --   --  186*  --  245*  BUN 21  --  21  --  30*  --   --  38*  --  44*  CREATININE 1.43*  --  1.29*  --  1.57*  --   --  1.68*  --  1.47*  CALCIUM 7.6*  --  7.6*  --  8.1*  --   --  8.0*  --  7.6*  MG  --   --  1.7  --  2.2 2.2  --  2.6* 2.5*  --   PHOS  --   --  2.7  --  4.0  --   --  3.3 3.4  --    < > = values in this interval not displayed.   GFR: Estimated Creatinine Clearance: 45.3 mL/min (A) (by C-G formula based on SCr of 1.47 mg/dL (H)). Liver Function Tests: Recent Labs  Lab 02/27/19 0152 02/28/19 0350 03/01/19 0455 03/02/19 0421 03/03/19 0848  AST 182* 83* 67* 84* 79*  ALT 75* 55* 51* 61* 67*  ALKPHOS 52 40 38 41 41  BILITOT 1.4* 0.8 1.0 1.2 0.8  PROT 6.2* 5.3* 5.4* 5.4* 4.9*  ALBUMIN 2.9* 2.5* 2.4* 2.4* 2.2*   No results for input(s): LIPASE, AMYLASE in the last 168 hours. No results for input(s): AMMONIA in the last 168 hours. Coagulation Profile: Recent Labs  Lab 05/29/18 2215  INR 1.3*   Cardiac Enzymes: No results for input(s): CKTOTAL, CKMB, CKMBINDEX, TROPONINI in the last 168 hours. BNP (last 3 results) No results for input(s): PROBNP in the last 8760 hours. HbA1C: No results for input(s): HGBA1C in the last 72 hours. CBG: Recent Labs  Lab 03/02/19 2317 03/03/19 0322 03/03/19 0802 03/03/19 1213 03/03/19 1559  GLUCAP 260* 242* 302* 257* 238*   Lipid Profile: Recent Labs    03/03/19 0848  CHOL 115  HDL 28*  LDLCALC 52  TRIG [id number]*  CHOLHDL 4.1   Thyroid Function Tests: No  results for input(s): TSH, T4TOTAL, FREET4, T3FREE, THYROIDAB in the last 72 hours. Anemia Panel: No results for input(s): VITAMINB12, FOLATE, FERRITIN, TIBC,  IRON, RETICCTPCT in the last 72 hours. Urine analysis:    Component Value Date/Time   COLORURINE YELLOW 02/21/2019 1752   APPEARANCEUR HAZY (A) 03/15/2019 1752   LABSPEC 1.013 03/08/2019 1752   PHURINE 6.0 02/22/2019 1752   GLUCOSEU NEGATIVE 02/17/2019 1752   HGBUR MODERATE (A) 02/25/2019 1752   BILIRUBINUR NEGATIVE 03/15/2019 1752   KETONESUR 5 (A) 03/13/2019 1752   PROTEINUR 100 (A) 03/05/2019 1752   UROBILINOGEN 0.2 10/27/2006 1337   NITRITE NEGATIVE 03/01/2019 1752   LEUKOCYTESUR LARGE (A) 03/12/2019 1752   Sepsis Labs: @LABRCNTIP (procalcitonin:4,lacticidven:4)  ) Recent Results (from the past 240 hour(s))  SARS CORONAVIRUS 2 (TAT 6-24 HRS) Nasopharyngeal Nasopharyngeal Swab     Status: Abnormal   Collection Time: 03/03/2019  5:42 PM   Specimen: Nasopharyngeal Swab  Result Value Ref Range Status   SARS Coronavirus 2 POSITIVE (A) NEGATIVE Final    Comment: RESULT CALLED TO, READ BACK BY AND VERIFIED WITH: Wendi Maya, RN AT [phone] ON 02/27/2019 BY SAINVILUS S (NOTE) SARS-CoV-2 target nucleic acids are DETECTED. The SARS-CoV-2 RNA is generally detectable in upper and lower respiratory specimens during the acute phase of infection. Positive results are indicative of active infection with SARS-CoV-2. Clinical  correlation with patient history and other diagnostic information is necessary to determine patient infection status. Positive results do  not rule out bacterial infection or co-infection with other viruses. The expected result is Negative. Fact Sheet for Patients: SugarRoll.be Fact Sheet for Healthcare Providers: https://www.-mathews.com/ This test is not yet approved or cleared by the [place] FDA and  has been authorized for detection and/or diagnosis of SARS-CoV-2 by FDA under an Emergency Use Authorization (EUA). This EUA will remain  in effect (meaning this test can b e used) for the duration of the COVID-19  declaration under Section 564(b)(1) of the Act, 21 U.S.C. section 360bbb-3(b)(1), unless the authorization is terminated or revoked sooner. Performed at [facility], [place] [address], [place], [place] [address]   Urine Culture     Status: Abnormal   Collection Time: 02/14/2019  5:52 PM   Specimen: Urine, Clean Catch  Result Value Ref Range Status   Specimen Description   Final    URINE, CLEAN CATCH Performed at [facility], [place] [address] [place], [place] [address]    Special Requests   Final    NONE Performed at [facility], [place] [address] [place], [place] [address]    Culture >=100,000 COLONIES/mL AEROCOCCUS URINAE (A)  Final   Report Status 03/03/2019 FINAL  Final  Blood culture (routine x 2)     Status: None   Collection Time: 02/18/2019  9:48 PM   Specimen: BLOOD  Result Value Ref Range Status   Specimen Description   Final    BLOOD BLOOD LEFT HAND Performed at [place] [address], [place], [place] [address]    Special Requests   Final    BOTTLES DRAWN AEROBIC AND ANAEROBIC Blood Culture adequate volume Performed at [place] [address] [place], [place] [address]    Culture   Final    NO GROWTH 5 DAYS Performed at [facility], [place] [address] [place], [place] [address]    Report Status 03/03/2019 FINAL  Final  Blood culture (routine x 2)     Status:  Abnormal (Preliminary result)   Collection Time: 2019-03-20  9:48 PM   Specimen: BLOOD  Result Value Ref Range Status   Specimen Description   Final    BLOOD BLOOD LEFT WRIST Performed at [facility], [address] [place], [place] [address]    Special Requests   Final    BOTTLES DRAWN AEROBIC AND ANAEROBIC Blood Culture adequate volume Performed at [facility], [address], [place], [place] [address]    Culture  Setup Time   Final    AEROBIC BOTTLE ONLY GRAM POSITIVE  COCCI CRITICAL RESULT CALLED TO, READ BACK BY AND VERIFIED WITH: S CHRISTY PHARMD 02/28/19 JDW 1723    Culture (A)  Final    FACKLAMIA HOMINIS Standardized susceptibility testing for this organism is not available. Performed at [facility], [address], [place], [place] [address]    Report Status PENDING  Incomplete  Culture, respiratory (non-expectorated)     Status: None   Collection Time: 02/27/19 11:30 AM   Specimen: Tracheal Aspirate; Respiratory  Result Value Ref Range Status   Specimen Description   Final    TRACHEAL ASPIRATE Performed at [facility], [address], [place], [place] [address]    Special Requests   Final    NONE Performed at [facility], [address] [place], [place] [address]    Gram Stain   Final    RARE WBC PRESENT,BOTH PMN AND MONONUCLEAR RARE GRAM POSITIVE COCCI IN PAIRS    Culture   Final    FEW Consistent with normal respiratory flora. Performed at [facility], [address], [place], [place] [address]    Report Status 03/02/2019 FINAL  Final  MRSA PCR Screening     Status: None   Collection Time: 02/27/19  5:20 PM   Specimen: Nasal Mucosa; Nasopharyngeal  Result Value Ref Range Status   MRSA by PCR NEGATIVE NEGATIVE Final    Comment:        The GeneXpert MRSA Assay (FDA approved for NASAL specimens only), is one component of a comprehensive MRSA colonization surveillance program. It is not intended to diagnose MRSA infection nor to guide or monitor treatment for MRSA infections. Performed at [facility], [address], [place], [place] [address]          Radiology Studies: No results found.      Scheduled Meds: . sodium chloride   Intravenous Once  . sodium chloride   Intravenous Once  . carvedilol  12.5 mg Oral BID WC  . chlorhexidine  15 mL Mouth/Throat BID  . Chlorhexidine Gluconate Cloth  6 each Topical Daily  . Chlorhexidine Gluconate  Cloth  6 each Topical Q0600  . [START ON 03/04/2019] dexamethasone (DECADRON) injection  6 mg Intravenous Q24H  . famotidine  20 mg Oral Daily  . feeding supplement (PRO-STAT SUGAR FREE 64)  30 mL Per Tube QID  . feeding supplement (VITAL AF 1.2 CAL)  1,000 mL Per Tube Q24H  . fentaNYL (SUBLIMAZE) injection  25 mcg Intravenous Once  . insulin aspart  0-20 Units Subcutaneous Q4H  . lidocaine  1 patch Transdermal Q24H  . mouth rinse  15 mL Mouth Rinse 10 times per day  . mupirocin ointment   Nasal BID  . sodium chloride flush  10-40 mL Intracatheter Q12H   Continuous Infusions: . cefTRIAXone (ROCEPHIN)  IV 2 g (03/03/19 1526)  . fentaNYL infusion INTRAVENOUS 100 mcg/hr (03/03/19  1200)  . norepinephrine (LEVOPHED) Adult infusion Stopped (02/27/19 9528)  . potassium chloride       LOS: 5 days   The patient is critically ill with multiple organ systems failure and requires high complexity decision making for assessment and support, frequent evaluation and titration of therapies, application of advanced monitoring technologies and extensive interpretation of multiple databases. Critical Care Time devoted to patient care services described in this note  Time spent: 40 minutes     Sigrid Schwebach, Roselind Messier, MD [facility] Pager [phone]  If 7PM-7AM, please contact night-coverage [email or web address] Password [email or web address] 03/03/2019, 6:49 PM

## 2019-03-03 NOTE — Progress Notes (Addendum)
NAME:  [name] RocheBetty [name], MRN:  [id number], DOB:  09/26/1940, LOS: 5 ADMISSION DATE:  03/14/2019, CONSULTATION DATE:  10/15 REFERRING MD:  EDP, CHIEF COMPLAINT:  dyspnea   Brief History   78 y/o female [facility] 10[facility] with [facility], had a PEA arrest in the ED requriing intubation, 3 doses of epinephrine with CPR.  Past Medical History  "inflammatory" arthritis Ascending aorta dilation HTN MR Obesity Atrial fibrillation, paroxysmal Polymyalgia rheumatica  Significant Hospital Events   Cardiac arrest 10/13, admission 10/14 [facility] 10/16 hemoglobin down, some pink urine but no frank bleeding identified  Consults:  PCCM  Procedures:  ETT 10/13>   Significant Diagnostic Tests:  10/13 CT head> NAICP 10/13 CT angiogram chest> no PE, patchy infiltrates, extensive rib fractures 10/16 CT head > NAICP 10/16 CT abdomen/pelvis> large R pelvic sidewall extraperitoneal hematoma, RLL posterior flank hematoma, multiple rib fractures, hepatic steatosis  10/14 echo:  Left ventricular ejection fraction, by visual estimation, is 25 to 30%. The left ventricle has severely decreased function. There is borderline left ventricular hypertrophy.  2. Left ventricular diastolic Doppler parameters are consistent with pseudonormalization pattern of LV diastolic filling.  3. Diffuse hypokinesis. Reduced RV function with RV enlargement.  Micro Data:  10/13 SARS COV2 > positive 10/13 blood >  10/13 Urine >  10/14 resp > GPC in pairs  Antimicrobials/COVID Rx  10/13 Decadron 8mg  BID day 6 10/13 Remdesivir >  10/13 Levaquin > 10/15 vanc 1gm daily -> 10/18 CTX 10/13 x1 (?), 10/18 -->  Interim history/subjective:  10/16   Objective   Blood pressure (!) 143/67, pulse 68, temperature 98.8 F (37.1 C), resp. rate (!) 23, height 5\' 4"  (1.626 m), weight (!) 148.9 kg, SpO2 93 %.    Vent Mode: PCV FiO2 (%):  [40 %-50 %] 50 % Set Rate:  [28 bmp] 28 bmp PEEP:  [8 cmH20-18 cmH20] 14 cmH20  Plateau Pressure:  [2 cmH20-30 cmH20] 27 cmH20   Intake/Output Summary (Last 24 hours) at 03/03/2019 1253 Last data filed at 03/03/2019 1100 Gross per 24 hour  Intake 2931.41 ml  Output 2170 ml  Net 761.41 ml   Filed Weights   03/01/19 0247 03/02/19 0450 03/03/19 0500  Weight: 135.6 kg (!) 136.1 kg (!) 148.9 kg    Examination:  General:  In bed on vent, minimal eye opening/fluttering to voice (on fentanyl infusion) HENT: NCAT ETT in place PULM: CTA B, vent supported breathing CV: RRR, no mgr GI: BS+, soft, nontender MSK: normal bulk and tone Neuro: minimal responsiveness to verbal stim, minimal eye opening with painful stimuli.   10/14 CXR images personally reviewed: R> L bilateral airspace disease/consolidation  Resolved Hospital Problem list     Assessment & Plan:  ARDS due to COVID 19 pneumonia Continue mechanical ventilation per ARDS protocol Target TVol 6-8cc/kgIBW Target Plateau Pressure < 30cm H20 Target driving pressure less than 15 cm of water Target PaO2 55-65: titrate PEEP/FiO2 per protocol As long as PaO2 to FiO2 ratio is less than 1:150 position in prone position for 16 hours a day --> not proning due to multiple rib fractures and flail chest after cardiac arrest (PAO2/Fio2 147 today).  Check CVP daily if CVL in place Target CVP less than 4, diurese as necessary Ventilator associated pneumonia prevention protocol 10/17 plan : Breathing over vent set at 28 today.   Still requiring FIO2 50%, Peep 14.   Will give additional lasix today.  PaO2 /FIO2 147 Lasix 40mg  x 1yesterday  On  remdesivir and decadron, will decrease dose to 6mg  daily.    Bleeding, R flank sidewall hematoma CT abdomen to look for RP bleed (done) Hold heparin ASA also held today.  Supportive care Monitor for bleeding Transfuse PRBC for Hgb < 7 gm/dL  HB stable after transfusion of 2 u yesterday.    AKI: Improving gradually Monitor BMET and UOP Replace electrolytes as needed  Would not administer IV fluids given ARDS  Flail chest: Pain control - fentanyl infusion for now Supportive care no proning  Need for sedation: Fentanyl infusion Prn versed Daily WUA RASS goal -1 to -2 Still minimally responsive, coughing and gagging, minimal eye opening.   Cardiac arrest, acute encephalopathy, at risk for hypoxemic injury: Minimize sedation as able (difficult though given ventilator dyssynchrony, air hunger) CT head unremarkable.   Atrial fibrillation Tele Heparin infusion > on hold with bleeding Heparin held since 10/16 amio po at home per chart.    Prognosis: poor   HTN - starting antihypertensives today.  Making sure pain is controlled.   Constipation: bowel regimen    Best practice:  Diet: tube feeding Pain/Anxiety/Delirium protocol (if indicated): yes, as above VAP protocol (if indicated): yes DVT prophylaxis: heparin held due to bleeding - consider restarting PPX dosing tomorrow.  GI prophylaxis: famotidine Glucose control: SSI Mobility: bed rest Code Status: limited code, no CPR if arrest again Family Communication: per [facility] Disposition: remain in ICU  Labs   CBC: Recent Labs  Lab 27-Feb-2019 1742  02/27/19 0243  02/28/19 0350 03/01/19 0329 03/01/19 0455 03/02/19 0333 03/02/19 0421 03/03/19 0500  WBC 6.5   < > 8.3  --  9.9  --  9.6  --  12.8* 12.3*  NEUTROABS 4.4  --   --   --  7.9*  --  7.2  --  9.4* 9.0*  HGB 11.7*   < > 10.5*   < > 8.7* 9.2* 7.7* 6.5* 6.8* 8.4*  HCT 35.4*   < > 33.2*   < > 27.4* 27.0* 23.8* 19.0* 21.3* 26.3*  MCV 104.7*   < > 107.4*  --  105.8*  --  105.3*  --  106.5* 99.6  PLT 145*   < > 132*  --  153  --  180  --  193 196   < > = values in this interval not displayed.    Basic Metabolic Panel: Recent Labs  Lab 02/27/19 1056  02/28/19 0350 03/01/19 0329 03/01/19 0455 03/01/19 1545 03/02/19 0333 03/02/19 0421 03/03/19 0500 03/03/19 0848  NA 134*   < > 138 138 138  --  136 139  --  141  K 5.4*   < >  2.8* 3.5 3.3* 3.7 3.9 4.0  --  3.6  CL 100  --  101  --  102  --   --  104  --  109  CO2 20*  --  22  --  23  --   --  24  --  23  GLUCOSE 149*  --  151*  --  197*  --   --  186*  --  245*  BUN 21  --  21  --  30*  --   --  38*  --  44*  CREATININE 1.43*  --  1.29*  --  1.57*  --   --  1.68*  --  1.47*  CALCIUM 7.6*  --  7.6*  --  8.1*  --   --  8.0*  --  7.6*  MG  --   --  1.7  --  2.2 2.2  --  2.6* 2.5*  --   PHOS  --   --  2.7  --  4.0  --   --  3.3 3.4  --    < > = values in this interval not displayed.   GFR: Estimated Creatinine Clearance: 45.3 mL/min (A) (by C-G formula based on SCr of 1.47 mg/dL (H)). Recent Labs  Lab 02/22/2019 1929  02/17/2019 2224 02/27/19 0152  02/28/19 0350 03/01/19 0455 03/02/19 0421 03/03/19 0500  PROCALCITON  --   --  12.80 44.96  --  65.54  --   --   --   WBC  --    < >  --  8.9   < > 9.9 9.6 12.8* 12.3*  LATICACIDVEN 1.2  --   --   --   --   --   --   --   --    < > = values in this interval not displayed.    Liver Function Tests: Recent Labs  Lab 02/27/19 0152 02/28/19 0350 03/01/19 0455 03/02/19 0421 03/03/19 0848  AST 182* 83* 67* 84* 79*  ALT 75* 55* 51* 61* 67*  ALKPHOS 52 40 38 41 41  BILITOT 1.4* 0.8 1.0 1.2 0.8  PROT 6.2* 5.3* 5.4* 5.4* 4.9*  ALBUMIN 2.9* 2.5* 2.4* 2.4* 2.2*   No results for input(s): LIPASE, AMYLASE in the last 168 hours. No results for input(s): AMMONIA in the last 168 hours.  ABG    Component Value Date/Time   PHART 7.395 03/02/2019 0333   PCO2ART 40.5 03/02/2019 0333   PO2ART 59.0 (L) 03/02/2019 0333   HCO3 24.8 03/02/2019 0333   TCO2 26 03/02/2019 0333   ACIDBASEDEF 2.0 02/28/2019 0349   O2SAT 90.0 03/02/2019 0333     Coagulation Profile: Recent Labs  Lab 02/18/2019 2215  INR 1.3*    Cardiac Enzymes: No results for input(s): CKTOTAL, CKMB, CKMBINDEX, TROPONINI in the last 168 hours.  HbA1C: Hgb A1c MFr Bld  Date/Time Value Ref Range Status  02/27/2019 02:43 AM 6.0 (H) 4.8 - 5.6 % Final     Comment:    (NOTE) Pre diabetes:          5.7%-6.4% Diabetes:              >6.4% Glycemic control for   <7.0% adults with diabetes     CBG: Recent Labs  Lab 03/02/19 1937 03/02/19 2317 03/03/19 0322 03/03/19 0802 03/03/19 1213  GLUCAP 229* 260* 242* 302* 257*     Critical care time: 45 minutes

## 2019-03-04 DIAGNOSIS — I5043 Acute on chronic combined systolic (congestive) and diastolic (congestive) heart failure: Secondary | ICD-10-CM | POA: Diagnosis not present

## 2019-03-04 DIAGNOSIS — R140 Abdominal distension (gaseous): Secondary | ICD-10-CM | POA: Diagnosis not present

## 2019-03-04 DIAGNOSIS — J9621 Acute and chronic respiratory failure with hypoxia: Secondary | ICD-10-CM | POA: Diagnosis not present

## 2019-03-04 DIAGNOSIS — J80 Acute respiratory distress syndrome: Secondary | ICD-10-CM | POA: Diagnosis not present

## 2019-03-04 DIAGNOSIS — G9340 Encephalopathy, unspecified: Secondary | ICD-10-CM | POA: Diagnosis not present

## 2019-03-04 DIAGNOSIS — U071 COVID-19: Secondary | ICD-10-CM | POA: Diagnosis not present

## 2019-03-04 LAB — CBC WITH DIFFERENTIAL/PLATELET
Abs Immature Granulocytes: 1.45 10*3/uL — ABNORMAL HIGH (ref 0.00–0.07)
Basophils Absolute: 0.1 10*3/uL (ref 0.0–0.1)
Basophils Relative: 1 %
Eosinophils Absolute: 0 10*3/uL (ref 0.0–0.5)
Eosinophils Relative: 0 %
HCT: 25.7 % — ABNORMAL LOW (ref 36.0–46.0)
Hemoglobin: 8.2 g/dL — ABNORMAL LOW (ref 12.0–15.0)
Immature Granulocytes: 10 %
Lymphocytes Relative: 5 %
Lymphs Abs: 0.7 10*3/uL (ref 0.7–4.0)
MCH: 32.3 pg (ref 26.0–34.0)
MCHC: 31.9 g/dL (ref 30.0–36.0)
MCV: 101.2 fL — ABNORMAL HIGH (ref 80.0–100.0)
Monocytes Absolute: 2.4 10*3/uL — ABNORMAL HIGH (ref 0.1–1.0)
Monocytes Relative: 17 %
Neutro Abs: 9.9 10*3/uL — ABNORMAL HIGH (ref 1.7–7.7)
Neutrophils Relative %: 67 %
Platelets: 192 10*3/uL (ref 150–400)
RBC: 2.54 MIL/uL — ABNORMAL LOW (ref 3.87–5.11)
RDW: 18.5 % — ABNORMAL HIGH (ref 11.5–15.5)
WBC: 14.5 10*3/uL — ABNORMAL HIGH (ref 4.0–10.5)
nRBC: 0.9 % — ABNORMAL HIGH (ref 0.0–0.2)

## 2019-03-04 LAB — D-DIMER, QUANTITATIVE: D-Dimer, Quant: 13.07 ug/mL-FEU — ABNORMAL HIGH (ref 0.00–0.50)

## 2019-03-04 LAB — COMPREHENSIVE METABOLIC PANEL
ALT: 63 U/L — ABNORMAL HIGH (ref 0–44)
AST: 61 U/L — ABNORMAL HIGH (ref 15–41)
Albumin: 2.3 g/dL — ABNORMAL LOW (ref 3.5–5.0)
Alkaline Phosphatase: 45 U/L (ref 38–126)
Anion gap: 8 (ref 5–15)
BUN: 49 mg/dL — ABNORMAL HIGH (ref 8–23)
CO2: 24 mmol/L (ref 22–32)
Calcium: 8.1 mg/dL — ABNORMAL LOW (ref 8.9–10.3)
Chloride: 111 mmol/L (ref 98–111)
Creatinine, Ser: 1.43 mg/dL — ABNORMAL HIGH (ref 0.44–1.00)
GFR calc Af Amer: 40 mL/min — ABNORMAL LOW (ref >60)
GFR calc non Af Amer: 35 mL/min — ABNORMAL LOW (ref >60)
Glucose, Bld: 194 mg/dL — ABNORMAL HIGH (ref 70–99)
Potassium: 4.1 mmol/L (ref 3.5–5.1)
Sodium: 143 mmol/L (ref 135–145)
Total Bilirubin: 0.8 mg/dL (ref 0.3–1.2)
Total Protein: 5.1 g/dL — ABNORMAL LOW (ref 6.5–8.1)

## 2019-03-04 LAB — POCT I-STAT 7, (LYTES, BLD GAS, ICA,H+H)
Acid-base deficit: 1 mmol/L (ref 0.0–2.0)
Bicarbonate: 24.2 mmol/L (ref 20.0–28.0)
Calcium, Ion: 1.24 mmol/L (ref 1.15–1.40)
HCT: 25 % — ABNORMAL LOW (ref 36.0–46.0)
Hemoglobin: 8.5 g/dL — ABNORMAL LOW (ref 12.0–15.0)
O2 Saturation: 88 %
Patient temperature: 99.1
Potassium: 4.3 mmol/L (ref 3.5–5.1)
Sodium: 143 mmol/L (ref 135–145)
TCO2: 25 mmol/L (ref 22–32)
pCO2 arterial: 41.5 mmHg (ref 32.0–48.0)
pH, Arterial: 7.374 (ref 7.350–7.450)
pO2, Arterial: 57 mmHg — ABNORMAL LOW (ref 83.0–108.0)

## 2019-03-04 LAB — CULTURE, BLOOD (ROUTINE X 2): Special Requests: ADEQUATE

## 2019-03-04 LAB — GLUCOSE, CAPILLARY
Glucose-Capillary: 186 mg/dL — ABNORMAL HIGH (ref 70–99)
Glucose-Capillary: 195 mg/dL — ABNORMAL HIGH (ref 70–99)
Glucose-Capillary: 204 mg/dL — ABNORMAL HIGH (ref 70–99)
Glucose-Capillary: 210 mg/dL — ABNORMAL HIGH (ref 70–99)
Glucose-Capillary: 213 mg/dL — ABNORMAL HIGH (ref 70–99)
Glucose-Capillary: 240 mg/dL — ABNORMAL HIGH (ref 70–99)
Glucose-Capillary: 241 mg/dL — ABNORMAL HIGH (ref 70–99)

## 2019-03-04 LAB — MAGNESIUM: Magnesium: 2.6 mg/dL — ABNORMAL HIGH (ref 1.7–2.4)

## 2019-03-04 LAB — C-REACTIVE PROTEIN: CRP: 1.6 mg/dL — ABNORMAL HIGH (ref <1.0)

## 2019-03-04 LAB — PHOSPHORUS: Phosphorus: 3.2 mg/dL (ref 2.5–4.6)

## 2019-03-04 MED ORDER — OXYCODONE HCL 5 MG/5ML PO SOLN
5.0000 mg | Freq: Four times a day (QID) | ORAL | Status: DC
  Administered 2019-03-04 – 2019-03-05 (×4): 5 mg
  Filled 2019-03-04 (×4): qty 5

## 2019-03-04 MED ORDER — ATORVASTATIN CALCIUM 40 MG PO TABS: 40.0000 mg | ORAL_TABLET | Freq: Every day | ORAL | Status: DC

## 2019-03-04 MED ORDER — FOLIC ACID 1 MG PO TABS
1.0000 mg | ORAL_TABLET | Freq: Every day | ORAL | Status: DC
  Administered 2019-03-04 – 2019-03-06 (×3): 1 mg via ORAL
  Filled 2019-03-04 (×3): qty 1

## 2019-03-04 MED ORDER — ACETAMINOPHEN 325 MG PO TABS
650.0000 mg | ORAL_TABLET | ORAL | Status: DC | PRN
  Administered 2019-03-05 – 2019-03-06 (×2): 650 mg
  Filled 2019-03-04 (×3): qty 2

## 2019-03-04 MED ORDER — VITAMIN D3 25 MCG (1000 UNIT) PO TABS: 2000.0000 [IU] | ORAL_TABLET | Freq: Every day | ORAL | Status: DC

## 2019-03-04 MED ORDER — GABAPENTIN 300 MG PO CAPS: 300.0000 mg | ORAL_CAPSULE | Freq: Every day | ORAL | Status: DC

## 2019-03-04 MED ORDER — CARVEDILOL 12.5 MG PO TABS
12.5000 mg | ORAL_TABLET | Freq: Two times a day (BID) | ORAL | Status: DC
  Administered 2019-03-04 – 2019-03-07 (×7): 12.5 mg
  Filled 2019-03-04 (×9): qty 1

## 2019-03-04 MED ORDER — VITAMIN D3 25 MCG (1000 UNIT) PO TABS
2000.0000 [IU] | ORAL_TABLET | Freq: Every day | ORAL | Status: DC
  Administered 2019-03-04 – 2019-03-06 (×3): 2000 [IU] via ORAL
  Filled 2019-03-04 (×4): qty 2

## 2019-03-04 MED ORDER — ATORVASTATIN CALCIUM 40 MG PO TABS
40.0000 mg | ORAL_TABLET | Freq: Every day | ORAL | Status: DC
  Administered 2019-03-04 – 2019-03-06 (×3): 40 mg
  Filled 2019-03-04 (×3): qty 1

## 2019-03-04 MED ORDER — GABAPENTIN 300 MG PO CAPS
300.0000 mg | ORAL_CAPSULE | Freq: Every day | ORAL | Status: DC
  Administered 2019-03-04 – 2019-03-06 (×3): 300 mg
  Filled 2019-03-04 (×3): qty 1

## 2019-03-04 NOTE — Progress Notes (Signed)
NAME:  [name] [name], MRN:  [id number], DOB:  10-Feb-1941, LOS: 6 ADMISSION DATE:  03/15/2019, CONSULTATION DATE:  10/15 REFERRING MD:  EDP, CHIEF COMPLAINT:  dyspnea   Brief History   78 yo female presented to [facility] ER with dyspnea, hypoxia and then developed PEA/asystolic cardiac arrest with ROSC after about 15 minutes.  Found to have COVID 19 pneumonia and transferred to [facility].  Past Medical History  Polymyalgia rheumatica, Inflammatory arthritis, Ascending aorta dilation, HTN, MR, PAF  Significant Hospital Events   10/13 Admit 10/14 transfer to [facility], rocuronium x one dose for vent dyssynchrony 10/16 progressive anemia; found to have Rt sided hematoma on CT abd/pelvis; heparin held 10/17 transfuse 2 units PRBC  Consults:    Procedures:  ETT 10/13 >> Rt PICC 10/14 >>   Significant Diagnostic Tests:  EEG 10/14 >> background suppression, intermittent generalized slowing Echo 10/14 >> EF 25 to 30%, mod RV systolic dysfx CT head 10/14 >> mild atrophy, mild small vessel ischemic changes of white matter CT angio chest 10/14 >> coronary calcification, b/l consolidation and GGO, b/l anterior rib fx's 2/3/4/5/6/7/8, lateral rib fx's Rt 6/7/8 CT abd/pelvis 10/16 >> fatty liver, large Rt pelvic sidewall extraperitoneal hematoma 18.1 x 5.6 cm, Rt lower posterior flank hematoma 9.8 x 5.3 x 4.7 cm CT head 10/16 >> no acute process  Micro Data:  10/13 SARS COV2 >> POSITIVE 10/13 Blood >> Facklamia hominis, Staph auricularis 10/13 urine >> Aerococcus 10/14 Sputum >> oral flora  Antimicrobials/COVID Rx  Decadron 10/13 >> Remdesivir 10/13 >> 10/17 Levaquin 10/13 >> 10/15 Vancomycin 10/14 >> 10/17 Rocephin 10/18 >>   Interim history/subjective:  Agitated with WUA.  Has some purposeful movements.  PaO2:FiO2 114.  On 50% with PEEP 14.  Objective   Blood pressure 139/71, pulse 73, temperature 98.4 F (36.9 C), temperature source Axillary, resp. rate (!) 34, height 5\' 4"  (1.626 m), weight (!)  151.1 kg, SpO2 90 %.    Vent Mode: PCV FiO2 (%):  [50 %] 50 % Set Rate:  [28 bmp] 28 bmp PEEP:  [14 cmH20] 14 cmH20 Plateau Pressure:  [19 cmH20-31 cmH20] 25 cmH20   Intake/Output Summary (Last 24 hours) at 03/04/2019 1025 Last data filed at 03/04/2019 0800 Gross per 24 hour  Intake 1683.78 ml  Output 1105 ml  Net 578.78 ml   Filed Weights   03/02/19 0450 03/03/19 0500 03/04/19 0400  Weight: (!) 136.1 kg (!) 148.9 kg (!) 151.1 kg    Examination:  General - sedated Eyes - pupils reactive ENT - ETT in place Cardiac - regular rate/rhythm, no murmur Chest - b/l crackles Abdomen - soft, non tender, + bowel sounds Extremities - 1+ edema Skin - no rashes Neuro - RASS -2   Resolved Hospital Problem list     Assessment & Plan:   Acute hypoxic respiratory failure with ARDS in setting of COVID 19 pneumonia. Multiple rib fx's after CPR. - goal SpO2 88 to 95%, plateau pressure < 30 cm H2O, driving pressure < 15 cm 03/06/19 - defer prone positioning in setting of multiple rib fractures - f/u CXR - continue decadron - completed remdesivir - negative fluid balance as able  Acute systolic CHF after respiratory leading to cardiac arrest. Hx of HTN, PAF. - continue coreg - hold outpt amiodarone, eliquis - hold anticoagulation in setting of Rt sided hematoma  Facklamia hominis bacteremia. - continue rocephin  Acute anoxic/metabolic encephalopathy. - RASS goal 0 to -1 - add scheduled opiates 10/19 in setting of multiple  rib fx's - resume outpt neurontin  Acute blood loss anemia from Rt pelvic sidewall extraperitoneal hematoma, Rt lower posterior flank hematoma. - f/u CBC - transfuse for Hb < 7 or significant bleeding  Hx of Polymyalgia rheumatica. - transition back to baseline prednisone dose after completing course of decadron for COVID 19 pneumonia - hold outpt MTX  Steroid induced hyperglycemia. - SSI  Best practice:  Diet: tube feeding DVT prophylaxis: SCDs  GI  prophylaxis: pepcid Mobility: bed rest Code Status: limited code, no CPR if arrest again Disposition: ICU  Labs    CMP Latest Ref Rng & Units 03/04/2019 03/04/2019 03/03/2019  Glucose 70 - 99 mg/dL 194(H) - 245(H)  BUN 8 - 23 mg/dL 49(H) - 44(H)  Creatinine 0.44 - 1.00 mg/dL 1.43(H) - 1.47(H)  Sodium 135 - 145 mmol/L 143 143 141  Potassium 3.5 - 5.1 mmol/L 4.1 4.3 3.6  Chloride 98 - 111 mmol/L 111 - 109  CO2 22 - 32 mmol/L 24 - 23  Calcium 8.9 - 10.3 mg/dL 8.1(L) - 7.6(L)  Total Protein 6.5 - 8.1 g/dL 5.1(L) - 4.9(L)  Total Bilirubin 0.3 - 1.2 mg/dL 0.8 - 0.8  Alkaline Phos 38 - 126 U/L 45 - 41  AST 15 - 41 U/L 61(H) - 79(H)  ALT 0 - 44 U/L 63(H) - 67(H)   CBC Latest Ref Rng & Units 03/04/2019 03/04/2019 03/03/2019  WBC 4.0 - 10.5 K/uL 14.5(H) - 12.3(H)  Hemoglobin 12.0 - 15.0 g/dL 8.2(L) 8.5(L) 8.4(L)  Hematocrit 36.0 - 46.0 % 25.7(L) 25.0(L) 26.3(L)  Platelets 150 - 400 K/uL 192 - 196   ABG    Component Value Date/Time   PHART 7.374 03/04/2019 0353   PCO2ART 41.5 03/04/2019 0353   PO2ART 57.0 (L) 03/04/2019 0353   HCO3 24.2 03/04/2019 0353   TCO2 25 03/04/2019 0353   ACIDBASEDEF 1.0 03/04/2019 0353   O2SAT 88.0 03/04/2019 0353   CBG (last 3)  Recent Labs    03/04/19 0030 03/04/19 0319 03/04/19 0741  GLUCAP 213* 210* 186*    CC time 37 minutes  D/w Dr. Altamese Worthington, MD [place] 03/04/2019, 10:59 AM

## 2019-03-04 NOTE — Progress Notes (Addendum)
PROGRESS NOTE    [name] [name]  ZOX:[id number] DOB: [birth year] DOA: 03/24/2019 PCP: Leanora Ivanoff., MD   Brief Narrative:  78 year old WF PMHx ascending aorta dilation, Chronic Systolic and Diastolic CHF, dilated cardiomyopathy, essential HTN, paroxysmal atrial fibrillation, mitral valve regurgitation moderate, polymyalgia rheumatica, chronic respiratory failure with hypoxia on 2 L O2 via [place] at home.  diagnosed as being Covid positive 10/12 presented with increased cough shortness of breath & hypoxia requiring 12L O2 , while being initially evaluated in the emergency room patient had a pulseless arrest.  Rhythm was asystole . Patient responded 3 doses of epinephrine during CPR with return of rhythm and blood pressure   Subjective: 10/19 intubated/sedated.  No response to painful stimuli.       Assessment & Plan:   Active Problems:   PAF (paroxysmal atrial fibrillation) (HCC)   HTN (hypertension)   DCM (dilated cardiomyopathy) (HCC)   IFG (impaired fasting glucose)   Morbid obesity (HCC)   Mitral regurgitation   Polymyalgia rheumatica (HCC)   Ascending aorta dilatation (HCC)   Respiratory arrest (HCC)   Acute on chronic respiratory failure with hypoxia (HCC)   Pneumonia due to COVID-19 virus   Acute on chronic combined systolic and diastolic CHF (congestive heart failure) (HCC)   Acute respiratory distress syndrome (ARDS) due to COVID-19 virus (HCC)   Aspiration pneumonia of both lower lobes due to gastric secretions (HCC)   Flail chest   Cardiac arrest (HCC)   Acute encephalopathy   Abdominal distention   Bleeding behind the abdominal cavity   Bacteremia  Patient evaluated by PCCM today plan below.  Covid 19 pneumonia/Acute on Chronic respiratory failure with hypoxia -Decadron 8 mg BID x 10 days -Remdesivir per pharmacy protocol -Combivent QID  -10/17 discontinue fentanyl.  Sedation holiday -10/18 yesterday when fentanyl discontinued patient's BP became  extremely elevated, became dyssynchronous with vent.  Increased fentanyl drip for comfort. -10/19 start gabapentin 300 mg QHS, -10/19 oxycodone 5 mg QID -10/19 after patient receives additional PO sedation medication will again attempt to wean fentanyl  ARDS -Lung protective strategy -Currently no need for paralytic -1016 Dr. Carolyne Littles discussed goals of care with husband.  See family communication -10/16 will decide on proning patient after obtaining head CT. -10/17 patient with continued blood loss, most likely extension of right side hematomas hold on Proning. -10/18 although appears hematoma stable for now unable to prone secondary to flail chest  Aspiration pneumonia- -Multiple admission chest x-ray arrest episodes -10/15 Tracheal aspirate normal flora  Flail chest -H& P reported flail chest, all review of PCXR unable to appreciate any fractured ribs and not mentioned by radiologist. -10/16 discussed case with radiologist patient has multiple bilateral rib fractures observed on her CT scan. -most likely secondary to CPR when patient coded -will increase weaning difficulty. -will require significant pain control/paralytics to eliminate vent dyssynchrony especially if patient is prone.  Status post Asystolic Arrest.   -Etiology of this is unclear.  May have been related to extreme hypoxia -10/14 echocardiogram shows worsening heart function see results below.    Acute on chronic systolic and diastolic CHF -Significantly reduced EF.  EF = 55% from echocardiogram 06/29/2018.  EF now 25 to 30%-also on previous echocardiogram mitral regurgitation read as moderate, now read as trace regurgitation.  -Patient cardiologist Dr. Armanda Magic.  Will consult cardiology, may need ICD placement once stable -Discussed case with Dr. Sherlean Foot cardiology.  She reviewed patient's chart, recommended medical management at this time.  See  note from 10/15  Paroxysmal atrial fibrillation  -Was on Eliquis (hold) while hospitalized. -10/16 Heparin IV  (Hold) see abdominal bleed  Bacteremia/positive Donney Rankins Hominis -10/18 discussed case with ID Dr. Catalina Antigua recommended ceftriaxone daily x7 days  Acute encephalopathy -Multifactorial, anoxic vs Covid 19 infection vs infection vs sedation. -Treat underlying cause  -EEG shows no seizure activity.  However shows diffuse encephalopathy see results below.- -Have been recommended by admitting physician use [place] for cooling protocol, however patient outside window upon admission to [facility]. -10/16 patient on minimal sedation no response to painful stimuli. -CT head negative acute findings see results below -See Covid pneumonia.  Abdominal distention -KUB negative for obstruction.  See results below -OG tube to low intermittent suction overnight, did not decompress patient.  This may be patient's body normal habitus.  Abdominal bleed -10/16 discussed case with radiologist abdominal/pelvic CT showed RIGHT side extraperitoneal hematoma and RIGHT flank hematoma -10/16 see A. Fib -10/17 transfused 2 units PRBC Recent Labs  Lab 03/02/19 0333 03/02/19 0421 03/03/19 0500 03/04/19 0353 03/04/19 0523  HGB 6.5* 6.8* 8.4* 8.5* 8.2*  -Currently appears stable  Hx chronic polymyalgia rheumatica -On chronic prednisone 5 to 20 mg daily. -Currently on Decadron for Covid pneumonia which will cover her chronic steroid use.   Hypokalemia -Potassium goal>4  Prediabetic -10/14 Hemoglobin A1c=6.0 -10/18 resistant SSI  HLD -10/18 LDL= 52 -10/19 Lipitor 40 mg daily    DVT prophylaxis: Heparin Code Status: Partial Family Communication: 10/19 spoke with Gracelyn Nurse (husband would like him to be primary POC).  Counsel on plan of care answered all questions.  Will speak with his stepfather but feels that if no improvement in another week they will need to decide on whether just to make her comfort care.    Disposition Plan:  TBD   Consultants:  PCCM Phone consult cardiology Phone consult ID Dr. Catalina Antigua     Procedures/Significant Events:  Head CT 10/13 >> neg CT angio chest 10/13 >> neg PE, consolidation bilateral, with patchy infiltrates,  mildly displaced anterior rib fractures: Bilateral ribs 2, 3, 4, 5 (up to moderately displaced on series 11, image 75), 6, 7, and 8. Additional lateral 6th 7th and 8th right rib fractures (flail segment) with similar displacement 10/14 echocardiogram;Left Ventricle: EF= 25 to 30%. -The left ventricle has severely decreased function.  -Left ventricular diastolic Doppler parameters are consistent with pseudonormalization pattern of LV diastolic filling. Diffuse hypokinesis. Right Ventricle:  moderately enlarged. No increase in right ventricular wall thickness.  -Global RV systolic function is has moderately reduced systolic function. The tricuspid regurgitant velocity is 2.43 m/s, and with an assumed right atrial pressure of 8 mmHg, the estimated right ventricular systolic pressure is mildly elevated at 31.6 mmHg. Mitral Valve: The mitral valve is normal in structure. Trace mitral valve regurgitation. 10/14 EEG-;suggestive of profound diffuse encephalopathy, which could be secondary to sedated state, diffuse anoxic/hypoxic injury.  -No seizures or epileptiform discharges were seen  10/14 KUB; nonobstructive bowel gas pattern 10/14 PCXR;-mild cardiomegaly. -Fairly extensive bilateral interstitial and ground-glass opacities in addition to multifocal consolidations, interval worsening at the right upper lobe and bilateral lower lobes.  -No pneumothorax. 10/16 CT abdomen and pelvis Wo contrast;-Large, RIGHT pelvic sidewall extraperitoneal hematoma -RIGHT lower posterior flank hematoma. -Multiple bilateral acute rib deformities are identified likely related to CPR. -Hepatic steatosis. 10/16 CT head  Wo contrast-negative acute infarct 10/17 transfused 2 units PRBC     I have personally reviewed and interpreted all radiology studies and  my findings are as above.  VENTILATOR SETTINGS: Vent mode; PCV Rate set; 28 Vt Set;  FiO2; 50% PEEP; 14    Cultures 10/13 urine pending 10/13 blood LEFT hand NGTD 10/13 blood LEFT wrist positive Facklamia Hominis 10/14 tracheal aspirate normal flora 10/14 MRSA PCR negative      Antimicrobials: Anti-infectives (From admission, onward)   Start     Stop   03/03/19 1400  cefTRIAXone (ROCEPHIN) 2 g in sodium chloride 0.9 % 100 mL IVPB     03/10/19 1359   02/28/19 2200  vancomycin (VANCOCIN) 1,500 mg in sodium chloride 0.9 % 500 mL IVPB  Status:  Discontinued     02/28/19 1144   02/28/19 2000  vancomycin (VANCOCIN) IVPB 1000 mg/200 mL premix  Status:  Discontinued     03/03/19 1300   02/28/19 1000  levofloxacin (LEVAQUIN) IVPB 750 mg  Status:  Discontinued     02/28/19 1144   02/27/19 2200  remdesivir 100 mg in sodium chloride 0.9 % 250 mL IVPB     03/02/19 1753   02/27/19 1000  vancomycin (VANCOCIN) 2,500 mg in sodium chloride 0.9 % 500 mL IVPB     02/27/19 1416   02/27/19 0930  levofloxacin (LEVAQUIN) IVPB 500 mg  Status:  Discontinued     02/27/19 0932   02/27/19 0000  remdesivir 200 mg in sodium chloride 0.9 % 250 mL IVPB     02/27/19 0039   02/17/2019 1915  levofloxacin (LEVAQUIN) IVPB 500 mg     03/02/2019 2138   02/15/2019 1900  cefTRIAXone (ROCEPHIN) 1 g in sodium chloride 0.9 % 100 mL IVPB  Status:  Discontinued     02/14/2019 1902       Devices   LINES / TUBES:      Continuous Infusions: . cefTRIAXone (ROCEPHIN)  IV Stopped (03/03/19 1556)  . fentaNYL infusion INTRAVENOUS 125 mcg/hr (03/04/19 0656)  . norepinephrine (LEVOPHED) Adult infusion Stopped (02/27/19 4098)     Objective: Vitals:   03/04/19 0319 03/04/19 0400 03/04/19 0500 03/04/19 0600  BP:  (!) 125/55 (!) 119/53 (!) 120/54  Pulse: 84 65 63 60  Resp: (!) 32 (!) 26 (!) 33 (!) 33  Temp:  99.1 F (37.3 C)    TempSrc:   Axillary    SpO2: 94% 95% 98% 99%  Weight:  (!) 151.1 kg    Height:        Intake/Output Summary (Last 24 hours) at 03/04/2019 0803 Last data filed at 03/04/2019 0700 Gross per 24 hour  Intake 1833.03 ml  Output 1335 ml  Net 498.03 ml   Filed Weights   03/02/19 0450 03/03/19 0500 03/04/19 0400  Weight: (!) 136.1 kg (!) 148.9 kg (!) 151.1 kg    Physical Exam:  General: Intubated/sedated, positive acute respiratory distress Eyes: negative scleral hemorrhage, negative anisocoria, negative icterus ENT: Negative Runny nose, negative gingival bleeding, #7.5 ETT in place Neck:  Negative scars, masses, torticollis, lymphadenopathy, JVD Lungs: Tachypneic clear to auscultation bilaterally without wheezes or crackles Cardiovascular: Regular rate and rhythm without murmur gallop or rub normal S1 and S2 Abdomen: MORBIDLY OBESE negative abdominal pain, nondistended, negative soft, bowel sounds, no rebound, no ascites, no appreciable mass Extremities: No significant cyanosis, clubbing, or edema bilateral lower extremities Skin: Negative rashes, lesions, ulcers Psychiatric: Intubated/sedated Central nervous system: Intubated/sedated         Data Reviewed: Care during the described time interval was provided by me .  I have reviewed this patient's available data, including medical  history, events of note, physical examination, and all test results as part of my evaluation.   CBC: Recent Labs  Lab 02/28/19 0350  03/01/19 0455 03/02/19 0333 03/02/19 0421 03/03/19 0500 03/04/19 0353 03/04/19 0523  WBC 9.9  --  9.6  --  12.8* 12.3*  --  14.5*  NEUTROABS 7.9*  --  7.2  --  9.4* 9.0*  --  PENDING  HGB 8.7*   < > 7.7* 6.5* 6.8* 8.4* 8.5* 8.2*  HCT 27.4*   < > 23.8* 19.0* 21.3* 26.3* 25.0* 25.7*  MCV 105.8*  --  105.3*  --  106.5* 99.6  --  101.2*  PLT 153  --  180  --  193 196  --  192   < > = values in this interval not displayed.   Basic Metabolic Panel: Recent Labs  Lab  02/27/19 1056  02/28/19 0350  03/01/19 0455 03/01/19 1545 03/02/19 0333 03/02/19 0421 03/03/19 0500 03/03/19 0848 03/04/19 0353  NA 134*   < > 138   < > 138  --  136 139  --  141 143  K 5.4*   < > 2.8*   < > 3.3* 3.7 3.9 4.0  --  3.6 4.3  CL 100  --  101  --  102  --   --  104  --  109  --   CO2 20*  --  22  --  23  --   --  24  --  23  --   GLUCOSE 149*  --  151*  --  197*  --   --  186*  --  245*  --   BUN 21  --  21  --  30*  --   --  38*  --  44*  --   CREATININE 1.43*  --  1.29*  --  1.57*  --   --  1.68*  --  1.47*  --   CALCIUM 7.6*  --  7.6*  --  8.1*  --   --  8.0*  --  7.6*  --   MG  --   --  1.7  --  2.2 2.2  --  2.6* 2.5*  --   --   PHOS  --   --  2.7  --  4.0  --   --  3.3 3.4  --   --    < > = values in this interval not displayed.   GFR: Estimated Creatinine Clearance: 45.7 mL/min (A) (by C-G formula based on SCr of 1.47 mg/dL (H)). Liver Function Tests: Recent Labs  Lab 02/27/19 0152 02/28/19 0350 03/01/19 0455 03/02/19 0421 03/03/19 0848  AST 182* 83* 67* 84* 79*  ALT 75* 55* 51* 61* 67*  ALKPHOS 52 40 38 41 41  BILITOT 1.4* 0.8 1.0 1.2 0.8  PROT 6.2* 5.3* 5.4* 5.4* 4.9*  ALBUMIN 2.9* 2.5* 2.4* 2.4* 2.2*   No results for input(s): LIPASE, AMYLASE in the last 168 hours. No results for input(s): AMMONIA in the last 168 hours. Coagulation Profile: Recent Labs  Lab 03/21/19 2215  INR 1.3*   Cardiac Enzymes: No results for input(s): CKTOTAL, CKMB, CKMBINDEX, TROPONINI in the last 168 hours. BNP (last 3 results) No results for input(s): PROBNP in the last 8760 hours. HbA1C: No results for input(s): HGBA1C in the last 72 hours. CBG: Recent Labs  Lab 03/03/19 1213 03/03/19 1559 03/03/19 1939 03/04/19 0030 03/04/19 0319  GLUCAP 257* 238* 231* 213*  210*   Lipid Profile: Recent Labs    03/03/19 0848  CHOL 115  HDL 28*  LDLCALC 52  TRIG 161174*  CHOLHDL 4.1   Thyroid Function Tests: No results for input(s): TSH, T4TOTAL, FREET4, T3FREE,  THYROIDAB in the last 72 hours. Anemia Panel: No results for input(s): VITAMINB12, FOLATE, FERRITIN, TIBC, IRON, RETICCTPCT in the last 72 hours. Urine analysis:    Component Value Date/Time   COLORURINE YELLOW 03/06/2019 1752   APPEARANCEUR HAZY (A) 03/06/2019 1752   LABSPEC 1.013 02/22/2019 1752   PHURINE 6.0 02/15/2019 1752   GLUCOSEU NEGATIVE 02/21/2019 1752   HGBUR MODERATE (A) 03/16/2019 1752   BILIRUBINUR NEGATIVE 02/18/2019 1752   KETONESUR 5 (A) 03/12/2019 1752   PROTEINUR 100 (A) 03/03/2019 1752   UROBILINOGEN 0.2 10/27/2006 1337   NITRITE NEGATIVE 02/25/2019 1752   LEUKOCYTESUR LARGE (A) 02/25/2019 1752   Sepsis Labs: @LABRCNTIP (procalcitonin:4,lacticidven:4)  ) Recent Results (from the past 240 hour(s))  SARS CORONAVIRUS 2 (TAT 6-24 HRS) Nasopharyngeal Nasopharyngeal Swab     Status: Abnormal   Collection Time: 02/16/2019  5:42 PM   Specimen: Nasopharyngeal Swab  Result Value Ref Range Status   SARS Coronavirus 2 POSITIVE (A) NEGATIVE Final    Comment: RESULT CALLED TO, READ BACK BY AND VERIFIED WITH: Zola ButtonINNAN J, RN AT [phone] ON 02/27/2019 BY SAINVILUS S (NOTE) SARS-CoV-2 target nucleic acids are DETECTED. The SARS-CoV-2 RNA is generally detectable in upper and lower respiratory specimens during the acute phase of infection. Positive results are indicative of active infection with SARS-CoV-2. Clinical  correlation with patient history and other diagnostic information is necessary to determine patient infection status. Positive results do  not rule out bacterial infection or co-infection with other viruses. The expected result is Negative. Fact Sheet for Patients: HairSlick.nohttps://www.fda.gov/media/138098/download Fact Sheet for Healthcare Providers: quierodirigir.comhttps://www.fda.gov/media/138095/download This test is not yet approved or cleared by the [place] FDA and  has been authorized for detection and/or diagnosis of SARS-CoV-2 by FDA under an Emergency Use Authorization (EUA).  This EUA will remain  in effect (meaning this test can b e used) for the duration of the COVID-19 declaration under Section 564(b)(1) of the Act, 21 U.S.C. section 360bbb-3(b)(1), unless the authorization is terminated or revoked sooner. Performed at [facility], [address] [place], [place] [address]   Urine Culture     Status: Abnormal   Collection Time: 02/15/2019  5:52 PM   Specimen: Urine, Clean Catch  Result Value Ref Range Status   Specimen Description   Final    URINE, CLEAN CATCH Performed at [facility], [address] [place], [place] [address]    Special Requests   Final    NONE Performed at [facility], [address] [place], [place] [address]    Culture >=100,000 COLONIES/mL AEROCOCCUS URINAE (A)  Final   Report Status 03/03/2019 FINAL  Final  Blood culture (routine x 2)     Status: None   Collection Time: 03/04/2019  9:48 PM   Specimen: BLOOD  Result Value Ref Range Status   Specimen Description   Final    BLOOD BLOOD LEFT HAND Performed at [facility], [address] [place], [place] [address]    Special Requests   Final    BOTTLES DRAWN AEROBIC AND ANAEROBIC Blood Culture adequate volume Performed at [facility], [address] [place], [place] [address]    Culture   Final    NO GROWTH 5 DAYS  Performed at [facility], [address] [place], [place] [address]    Report Status 03/03/2019 FINAL  Final  Blood culture (routine x 2)     Status: Abnormal (Preliminary result)   Collection Time: 03/01/2019  9:48 PM   Specimen: BLOOD  Result Value Ref Range Status   Specimen Description   Final    BLOOD BLOOD LEFT WRIST Performed at [facility], [address] [place], [place] [address]    Special Requests   Final    BOTTLES DRAWN AEROBIC AND ANAEROBIC Blood Culture adequate volume Performed at [facility], [address] [place], [place] [address]    Culture  Setup Time   Final    AEROBIC BOTTLE ONLY GRAM POSITIVE COCCI CRITICAL RESULT CALLED TO, READ BACK BY AND VERIFIED WITH: S CHRISTY PHARMD 02/28/19 JDW 1723    Culture (A)  Final    FACKLAMIA HOMINIS Standardized susceptibility testing for this organism is not available. Performed at [facility], [address] [place], [place] [address]    Report Status PENDING  Incomplete  Culture, respiratory (non-expectorated)     Status: None   Collection Time: 02/27/19 11:30 AM   Specimen: Tracheal Aspirate; Respiratory  Result Value Ref Range Status   Specimen Description   Final    TRACHEAL ASPIRATE Performed at [facility], [address] [place], [place] [address]    Special Requests   Final    NONE Performed at [facility], [address], [place], [place] [address]    Gram Stain   Final    RARE WBC PRESENT,BOTH PMN AND MONONUCLEAR RARE GRAM POSITIVE COCCI IN PAIRS    Culture   Final    FEW Consistent with normal respiratory flora. Performed at [facility], [address], [place], [place] [address]    Report Status 03/02/2019 FINAL  Final  MRSA PCR Screening     Status: None   Collection Time: 02/27/19  5:20 PM   Specimen: Nasal Mucosa; Nasopharyngeal  Result Value Ref Range Status   MRSA by PCR NEGATIVE NEGATIVE Final    Comment:        The GeneXpert MRSA Assay (FDA approved for NASAL specimens only), is one component of a comprehensive MRSA colonization surveillance program. It is not intended to diagnose MRSA infection nor to guide or monitor treatment for MRSA infections. Performed at [facility], [address] [place], [place] [address]          Radiology Studies: No results found.      Scheduled Meds: . sodium chloride   Intravenous Once  . sodium chloride   Intravenous Once  . carvedilol  12.5 mg Oral BID WC  . chlorhexidine  15 mL  Mouth/Throat BID  . Chlorhexidine Gluconate Cloth  6 each Topical Daily  . dexamethasone (DECADRON) injection  6 mg Intravenous Q24H  . famotidine  20 mg Oral Daily  . feeding supplement (PRO-STAT SUGAR FREE 64)  30 mL Per Tube QID  . feeding supplement (VITAL AF 1.2 CAL)  1,000 mL Per Tube Q24H  . fentaNYL (SUBLIMAZE) injection  25 mcg Intravenous Once  . insulin aspart  0-20 Units Subcutaneous Q4H  . lidocaine  1 patch Transdermal Q24H  . mouth rinse  15 mL Mouth Rinse 10 times per day  . mupirocin ointment   Nasal BID  . sodium chloride flush  10-40 mL Intracatheter Q12H  Continuous Infusions: . cefTRIAXone (ROCEPHIN)  IV Stopped (03/03/19 1556)  . fentaNYL infusion INTRAVENOUS 125 mcg/hr (03/04/19 0656)  . norepinephrine (LEVOPHED) Adult infusion Stopped (02/27/19 1610)     LOS: 6 days   The patient is critically ill with multiple organ systems failure and requires high complexity decision making for assessment and support, frequent evaluation and titration of therapies, application of advanced monitoring technologies and extensive interpretation of multiple databases. Critical Care Time devoted to patient care services described in this note  Time spent: 40 minutes     Teiana Hajduk, Roselind Messier, MD [facility] Pager [phone]  If 7PM-7AM, please contact night-coverage [email or web address] Password [email or web address] 03/04/2019, 8:03 AM

## 2019-03-04 NOTE — Plan of Care (Signed)
  Problem: Education: Goal: Knowledge of General Education information will improve Description: Including pain rating scale, medication(s)/side effects and non-pharmacologic comfort measures 03/04/2019 1129 by Christella Hartigan, RN Outcome: Progressing 03/04/2019 1128 by Christella Hartigan, RN Outcome: Progressing   Problem: Health Behavior/Discharge Planning: Goal: Ability to manage health-related needs will improve 03/04/2019 1129 by Christella Hartigan, RN Outcome: Progressing 03/04/2019 1128 by Christella Hartigan, RN Outcome: Progressing   Problem: Clinical Measurements: Goal: Ability to maintain clinical measurements within normal limits will improve 03/04/2019 1129 by Christella Hartigan, RN Outcome: Progressing 03/04/2019 1128 by Christella Hartigan, RN Outcome: Progressing Goal: Will remain free from infection 03/04/2019 1129 by Christella Hartigan, RN Outcome: Progressing 03/04/2019 1128 by Christella Hartigan, RN Outcome: Progressing Goal: Diagnostic test results will improve 03/04/2019 1129 by Christella Hartigan, RN Outcome: Progressing 03/04/2019 1128 by Christella Hartigan, RN Outcome: Progressing Goal: Respiratory complications will improve 03/04/2019 1129 by Christella Hartigan, RN Outcome: Progressing 03/04/2019 1128 by Christella Hartigan, RN Outcome: Progressing Goal: Cardiovascular complication will be avoided 03/04/2019 1129 by Christella Hartigan, RN Outcome: Progressing 03/04/2019 1128 by Christella Hartigan, RN Outcome: Progressing   Problem: Activity: Goal: Risk for activity intolerance will decrease 03/04/2019 1129 by Christella Hartigan, RN Outcome: Progressing 03/04/2019 1128 by Christella Hartigan, RN Outcome: Progressing   Problem: Nutrition: Goal: Adequate nutrition will be maintained 03/04/2019 1129 by Christella Hartigan, RN Outcome: Progressing 03/04/2019 1128 by Christella Hartigan, RN Outcome: Progressing   Problem: Coping: Goal: Level of anxiety will decrease 03/04/2019  1129 by Christella Hartigan, RN Outcome: Progressing 03/04/2019 1128 by Christella Hartigan, RN Outcome: Progressing   Problem: Elimination: Goal: Will not experience complications related to bowel motility 03/04/2019 1129 by Christella Hartigan, RN Outcome: Progressing 03/04/2019 1128 by Christella Hartigan, RN Outcome: Progressing Goal: Will not experience complications related to urinary retention 03/04/2019 1129 by Christella Hartigan, RN Outcome: Progressing 03/04/2019 1128 by Christella Hartigan, RN Outcome: Progressing   Problem: Pain Managment: Goal: General experience of comfort will improve 03/04/2019 1129 by Christella Hartigan, RN Outcome: Progressing 03/04/2019 1128 by Christella Hartigan, RN Outcome: Progressing   Problem: Safety: Goal: Ability to remain free from injury will improve 03/04/2019 1129 by Christella Hartigan, RN Outcome: Progressing 03/04/2019 1128 by Christella Hartigan, RN Outcome: Progressing   Problem: Skin Integrity: Goal: Risk for impaired skin integrity will decrease 03/04/2019 1129 by Christella Hartigan, RN Outcome: Progressing 03/04/2019 1128 by Christella Hartigan, RN Outcome: Progressing

## 2019-03-04 NOTE — Progress Notes (Signed)
Attempted to contact son with update on POC - left message

## 2019-03-04 NOTE — Plan of Care (Signed)

## 2019-03-05 ENCOUNTER — Inpatient Hospital Stay (HOSPITAL_COMMUNITY): Payer: HMO

## 2019-03-05 DIAGNOSIS — J9621 Acute and chronic respiratory failure with hypoxia: Secondary | ICD-10-CM | POA: Diagnosis not present

## 2019-03-05 DIAGNOSIS — U071 COVID-19: Secondary | ICD-10-CM | POA: Diagnosis not present

## 2019-03-05 DIAGNOSIS — J80 Acute respiratory distress syndrome: Secondary | ICD-10-CM | POA: Diagnosis not present

## 2019-03-05 DIAGNOSIS — G9340 Encephalopathy, unspecified: Secondary | ICD-10-CM | POA: Diagnosis not present

## 2019-03-05 DIAGNOSIS — I5043 Acute on chronic combined systolic (congestive) and diastolic (congestive) heart failure: Secondary | ICD-10-CM | POA: Diagnosis not present

## 2019-03-05 DIAGNOSIS — R140 Abdominal distension (gaseous): Secondary | ICD-10-CM | POA: Diagnosis not present

## 2019-03-05 LAB — CBC WITH DIFFERENTIAL/PLATELET
Abs Immature Granulocytes: 1.4 10*3/uL — ABNORMAL HIGH (ref 0.00–0.07)
Band Neutrophils: 4 %
Basophils Absolute: 0 10*3/uL (ref 0.0–0.1)
Basophils Relative: 0 %
Eosinophils Absolute: 0 10*3/uL (ref 0.0–0.5)
Eosinophils Relative: 0 %
HCT: 28.3 % — ABNORMAL LOW (ref 36.0–46.0)
Hemoglobin: 8.9 g/dL — ABNORMAL LOW (ref 12.0–15.0)
Lymphocytes Relative: 7 %
Lymphs Abs: 1.2 10*3/uL (ref 0.7–4.0)
MCH: 32.8 pg (ref 26.0–34.0)
MCHC: 31.4 g/dL (ref 30.0–36.0)
MCV: 104.4 fL — ABNORMAL HIGH (ref 80.0–100.0)
Metamyelocytes Relative: 2 %
Monocytes Absolute: 1.6 10*3/uL — ABNORMAL HIGH (ref 0.1–1.0)
Monocytes Relative: 9 %
Myelocytes: 6 %
Neutro Abs: 13.1 10*3/uL — ABNORMAL HIGH (ref 1.7–7.7)
Neutrophils Relative %: 72 %
Platelets: 183 10*3/uL (ref 150–400)
RBC: 2.71 MIL/uL — ABNORMAL LOW (ref 3.87–5.11)
RDW: 19 % — ABNORMAL HIGH (ref 11.5–15.5)
WBC: 17.3 10*3/uL — ABNORMAL HIGH (ref 4.0–10.5)
nRBC: 0.8 % — ABNORMAL HIGH (ref 0.0–0.2)

## 2019-03-05 LAB — COMPREHENSIVE METABOLIC PANEL
ALT: 61 U/L — ABNORMAL HIGH (ref 0–44)
AST: 54 U/L — ABNORMAL HIGH (ref 15–41)
Albumin: 2.4 g/dL — ABNORMAL LOW (ref 3.5–5.0)
Alkaline Phosphatase: 53 U/L (ref 38–126)
Anion gap: 9 (ref 5–15)
BUN: 54 mg/dL — ABNORMAL HIGH (ref 8–23)
CO2: 25 mmol/L (ref 22–32)
Calcium: 8.3 mg/dL — ABNORMAL LOW (ref 8.9–10.3)
Chloride: 114 mmol/L — ABNORMAL HIGH (ref 98–111)
Creatinine, Ser: 1.5 mg/dL — ABNORMAL HIGH (ref 0.44–1.00)
GFR calc Af Amer: 38 mL/min — ABNORMAL LOW (ref >60)
GFR calc non Af Amer: 33 mL/min — ABNORMAL LOW (ref >60)
Glucose, Bld: 195 mg/dL — ABNORMAL HIGH (ref 70–99)
Potassium: 3.9 mmol/L (ref 3.5–5.1)
Sodium: 148 mmol/L — ABNORMAL HIGH (ref 135–145)
Total Bilirubin: 0.8 mg/dL (ref 0.3–1.2)
Total Protein: 5.4 g/dL — ABNORMAL LOW (ref 6.5–8.1)

## 2019-03-05 LAB — POCT I-STAT 7, (LYTES, BLD GAS, ICA,H+H)
Acid-base deficit: 1 mmol/L (ref 0.0–2.0)
Bicarbonate: 24.2 mmol/L (ref 20.0–28.0)
Calcium, Ion: 1.29 mmol/L (ref 1.15–1.40)
HCT: 27 % — ABNORMAL LOW (ref 36.0–46.0)
Hemoglobin: 9.2 g/dL — ABNORMAL LOW (ref 12.0–15.0)
O2 Saturation: 87 %
Patient temperature: 37.6
Potassium: 4.1 mmol/L (ref 3.5–5.1)
Sodium: 147 mmol/L — ABNORMAL HIGH (ref 135–145)
TCO2: 25 mmol/L (ref 22–32)
pCO2 arterial: 45 mmHg (ref 32.0–48.0)
pH, Arterial: 7.341 — ABNORMAL LOW (ref 7.350–7.450)
pO2, Arterial: 58 mmHg — ABNORMAL LOW (ref 83.0–108.0)

## 2019-03-05 LAB — C-REACTIVE PROTEIN: CRP: 1.9 mg/dL — ABNORMAL HIGH (ref <1.0)

## 2019-03-05 LAB — GLUCOSE, CAPILLARY
Glucose-Capillary: 179 mg/dL — ABNORMAL HIGH (ref 70–99)
Glucose-Capillary: 198 mg/dL — ABNORMAL HIGH (ref 70–99)
Glucose-Capillary: 177 mg/dL — ABNORMAL HIGH (ref 70–99)
Glucose-Capillary: 225 mg/dL — ABNORMAL HIGH (ref 70–99)
Glucose-Capillary: 176 mg/dL — ABNORMAL HIGH (ref 70–99)

## 2019-03-05 LAB — MAGNESIUM: Magnesium: 2.7 mg/dL — ABNORMAL HIGH (ref 1.7–2.4)

## 2019-03-05 LAB — PHOSPHORUS: Phosphorus: 4.2 mg/dL (ref 2.5–4.6)

## 2019-03-05 LAB — D-DIMER, QUANTITATIVE: D-Dimer, Quant: >20 ug/mL-FEU — ABNORMAL HIGH (ref 0.00–0.50)

## 2019-03-05 MED ORDER — OXYCODONE HCL 5 MG/5ML PO SOLN
10.0000 mg | Freq: Four times a day (QID) | ORAL | Status: DC
  Administered 2019-03-05 – 2019-03-07 (×9): 10 mg
  Filled 2019-03-05 (×9): qty 10

## 2019-03-05 MED ORDER — HEPARIN SODIUM (PORCINE) 10000 UNIT/ML IJ SOLN
10000.0000 [IU] | Freq: Three times a day (TID) | INTRAMUSCULAR | Status: DC
  Administered 2019-03-05 – 2019-03-07 (×7): 10000 [IU] via SUBCUTANEOUS
  Filled 2019-03-05 (×7): qty 1

## 2019-03-05 MED ORDER — INSULIN ASPART 100 UNIT/ML ~~LOC~~ SOLN
2.0000 [IU] | SUBCUTANEOUS | Status: DC
  Administered 2019-03-05 – 2019-03-07 (×14): 2 [IU] via SUBCUTANEOUS

## 2019-03-05 NOTE — Progress Notes (Signed)
Notified MD of low-grade fever

## 2019-03-05 NOTE — Plan of Care (Signed)
Pt's son was updated and able to [place] with the pt.

## 2019-03-05 NOTE — Progress Notes (Signed)
NAME:  [name] [name], MRN:  [id number], DOB:  [birth year], LOS: 7 ADMISSION DATE:  2019-02-28, CONSULTATION DATE:  10/15 REFERRING MD:  EDP, CHIEF COMPLAINT:  dyspnea   Brief History   78 yo female presented to [facility] ER with dyspnea, hypoxia and then developed PEA/asystolic cardiac arrest with ROSC after about 15 minutes.  Found to have COVID 19 pneumonia and transferred to [facility].  Past Medical History  Polymyalgia rheumatica, Inflammatory arthritis, Ascending aorta dilation, HTN, MR, PAF  Significant Hospital Events   10/13 Admit 10/14 transfer to [facility], rocuronium x one dose for vent dyssynchrony 10/16 progressive anemia; found to have Rt sided hematoma on CT abd/pelvis; heparin held 10/17 transfuse 2 units PRBC  Consults:    Procedures:  ETT 10/13 >> Rt PICC 10/14 >>   Significant Diagnostic Tests:  EEG 10/14 >> background suppression, intermittent generalized slowing Echo 10/14 >> EF 25 to 87%, mod RV systolic dysfx CT head 56/43 >> mild atrophy, mild small vessel ischemic changes of white matter CT angio chest 10/14 >> coronary calcification, b/l consolidation and GGO, b/l anterior rib fx's 2/3/4/5/6/7/8, lateral rib fx's Rt 6/7/8 CT abd/pelvis 10/16 >> fatty liver, large Rt pelvic sidewall extraperitoneal hematoma 18.1 x 5.6 cm, Rt lower posterior flank hematoma 9.8 x 5.3 x 4.7 cm CT head 10/16 >> no acute process  Micro Data:  10/13 SARS COV2 >> POSITIVE 10/13 Blood >> Facklamia hominis, Staph auricularis 10/13 urine >> Aerococcus 10/14 Sputum >> oral flora  Antimicrobials/COVID Rx  Decadron 10/13 >> Remdesivir 10/13 >> 10/17 Levaquin 10/13 >> 10/15 Vancomycin 10/14 >> 10/17 Rocephin 10/18 >>   Interim history/subjective:  Remains on sedation.  PaO2:FiO2 83.  On FiO2 70%, PEEP 14.  Objective   Blood pressure (!) 158/75, pulse 76, temperature 100 F (37.8 C), temperature source Axillary, resp. rate 20, height 5\' 4"  (1.626 m), weight (!) 145.1 kg, SpO2 93 %.    Vent  Mode: PCV FiO2 (%):  [50 %-70 %] 70 % Set Rate:  [28 bmp] 28 bmp PEEP:  [14 cmH20] 14 cmH20 Plateau Pressure:  [15 cmH20-27 cmH20] 27 cmH20   Intake/Output Summary (Last 24 hours) at 03/05/2019 1027 Last data filed at 03/05/2019 1000 Gross per 24 hour  Intake 1633.47 ml  Output 825 ml  Net 808.47 ml   Filed Weights   03/03/19 0500 03/04/19 0400 03/05/19 0500  Weight: (!) 148.9 kg (!) 151.1 kg (!) 145.1 kg    Examination:  General - sedated Eyes - pupils reactive ENT - ETT in place Cardiac - regular rate/rhythm, no murmur Chest - scattered rhonchi Abdomen - soft, non tender, + bowel sounds Extremities - 1+ edema Skin - no rashes Neuro - RASS -2  CXR (reviewed by me) - diffuse b/l ASD   Resolved Hospital Problem list     Assessment & Plan:   Acute hypoxic respiratory failure with ARDS in setting of COVID 19 pneumonia. Multiple rib fx's after CPR. - goal SpO2 88 to 95%, plateau pressure < 30 cm H2O, driving pressure < 15 cm H2O - defer prone positioning in setting of multiple rib fractures - f/u CXR - day 8/10 of decadron - completed remdesivir - negative fluid balance as able  Acute systolic CHF after respiratory leading to cardiac arrest. Hx of HTN, PAF. - continue coreg - hold outpt amiodarone, eliquis - hold anticoagulation in setting of Rt sided hematoma  Facklamia hominis bacteremia. - day 3 of rocephin  Acute anoxic/metabolic encephalopathy. - RASS goal 0 to -1 -  added scheduled opiates 10/19 in setting of multiple rib fx's - continue neurontin  Acute blood loss anemia from Rt pelvic sidewall extraperitoneal hematoma, Rt lower posterior flank hematoma. - f/u CBC - transfuse for Hb < 7 or significant bleeding  Hx of Polymyalgia rheumatica. - transition back to baseline prednisone dose after completing course of decadron for COVID 19 pneumonia - hold outpt MTX  Steroid induced hyperglycemia. - SSI  Goals of care. - prognosis for meaningful  recovery seem poor at this point - hospitalist team having ongoing d/w family   Best practice:  Diet: tube feeding DVT prophylaxis: SCDs  GI prophylaxis: pepcid Mobility: bed rest Code Status: limited code, no CPR if arrest again Disposition: ICU  Labs    CMP Latest Ref Rng & Units 03/05/2019 03/05/2019 03/04/2019  Glucose 70 - 99 mg/dL 742(V) - 956(L)  BUN 8 - 23 mg/dL 87(F) - 64(P)  Creatinine 0.44 - 1.00 mg/dL 3.29(J) - 1.88(C)  Sodium 135 - 145 mmol/L 148(H) 147(H) 143  Potassium 3.5 - 5.1 mmol/L 3.9 4.1 4.1  Chloride 98 - 111 mmol/L 114(H) - 111  CO2 22 - 32 mmol/L 25 - 24  Calcium 8.9 - 10.3 mg/dL 8.3(L) - 8.1(L)  Total Protein 6.5 - 8.1 g/dL 1.6(S) - 5.1(L)  Total Bilirubin 0.3 - 1.2 mg/dL 0.8 - 0.8  Alkaline Phos 38 - 126 U/L 53 - 45  AST 15 - 41 U/L 54(H) - 61(H)  ALT 0 - 44 U/L 61(H) - 63(H)   CBC Latest Ref Rng & Units 03/05/2019 03/05/2019 03/04/2019  WBC 4.0 - 10.5 K/uL 17.3(H) - 14.5(H)  Hemoglobin 12.0 - 15.0 g/dL 0.6(T) 0.1(S) 0.1(U)  Hematocrit 36.0 - 46.0 % 28.3(L) 27.0(L) 25.7(L)  Platelets 150 - 400 K/uL 183 - 192   ABG    Component Value Date/Time   PHART 7.341 (L) 03/05/2019 0418   PCO2ART 45.0 03/05/2019 0418   PO2ART 58.0 (L) 03/05/2019 0418   HCO3 24.2 03/05/2019 0418   TCO2 25 03/05/2019 0418   ACIDBASEDEF 1.0 03/05/2019 0418   O2SAT 87.0 03/05/2019 0418   CBG (last 3)  Recent Labs    03/04/19 2342 03/05/19 0354 03/05/19 0738  GLUCAP 240* 179* 198*    D/w Dr. Joseph Art  CC time 33 minutes  Coralyn Helling, MD [facility] 03/05/2019, 10:27 AM

## 2019-03-05 NOTE — Progress Notes (Signed)
Video call with son, Quita Skye completed. Provided update and answered questions. Patient not sedated or responding. Advised Patient's condition is grim. We can hope and pray for the best, but need to prepare for the worst. Educated on Covid and complications related. To the virus.  Son very grateful for care and update. He will update his family.

## 2019-03-05 NOTE — Progress Notes (Signed)
Updated son on POC and offered opportunity to ask questions and address concerns.  Son states that, while he appreciates the call, to please not call during the day shift unless absolutely emergent d/t the nature of his work.

## 2019-03-05 NOTE — Progress Notes (Addendum)
PROGRESS NOTE    [name] [name]  PRF:[id number] DOB: February 19, 1941 DOA: 02/20/2019 PCP: Leanora Ivanoff., MD   Brief Narrative:  78 year old WF PMHx ascending aorta dilation, Chronic Systolic and Diastolic CHF, dilated cardiomyopathy, essential HTN, paroxysmal atrial fibrillation, mitral valve regurgitation moderate, polymyalgia rheumatica, chronic respiratory failure with hypoxia on 2 L O2 via [place] at home.  diagnosed as being Covid positive 10/12 presented with increased cough shortness of breath & hypoxia requiring 12L O2 , while being initially evaluated in the emergency room patient had a pulseless arrest.  Rhythm was asystole . Patient responded 3 doses of epinephrine during CPR with return of rhythm and blood pressure   Subjective: 10/20 intubated/sedated.  No response to painful stimuli.   Assessment & Plan:   Active Problems:   PAF (paroxysmal atrial fibrillation) (HCC)   HTN (hypertension)   DCM (dilated cardiomyopathy) (HCC)   IFG (impaired fasting glucose)   Morbid obesity (HCC)   Mitral regurgitation   Polymyalgia rheumatica (HCC)   Ascending aorta dilatation (HCC)   Respiratory arrest (HCC)   Acute on chronic respiratory failure with hypoxia (HCC)   Pneumonia due to COVID-19 virus   Acute on chronic combined systolic and diastolic CHF (congestive heart failure) (HCC)   Acute respiratory distress syndrome (ARDS) due to COVID-19 virus (HCC)   Aspiration pneumonia of both lower lobes due to gastric secretions (HCC)   Flail chest   Cardiac arrest (HCC)   Acute encephalopathy   Abdominal distention   Bleeding behind the abdominal cavity   Bacteremia  Patient evaluated by PCCM today plan below.  Covid 19 pneumonia/Acute on Chronic respiratory failure with hypoxia -Decadron 8 mg BID x 10 days -Remdesivir per pharmacy protocol -Combivent QID  -10/17 discontinue fentanyl.  Sedation holiday -10/18 yesterday when fentanyl discontinued patient's BP became extremely  elevated, became dyssynchronous with vent.  Increased fentanyl drip for comfort. -10/19 start gabapentin 300 mg QHS, -10/19 oxycodone 5 mg QID -10/19 after patient receives additional PO sedation medication will again attempt to wean fentanyl 10/20 attempts to wean unsuccessful, patient's respiratory status deteriorated  ARDS -Lung protective strategy -Currently no need for paralytic -1016 Dr. Carolyne Littles discussed goals of care with husband.  See family communication -10/16 will decide on proning patient after obtaining head CT. -10/17 patient with continued blood loss, most likely extension of right side hematomas hold on Proning. -10/18 although appears hematoma stable for now unable to prone secondary to flail chest  Aspiration pneumonia- -Multiple admission chest x-ray arrest episodes -10/14 Tracheal aspirate normal flora.  Not aspiration pneumonia  Flail chest -H& P reported flail chest, all review of PCXR unable to appreciate any fractured ribs and not mentioned by radiologist. -10/16 discussed case with radiologist patient has multiple bilateral rib fractures observed on her CT scan. -most likely secondary to CPR when patient coded -will increase weaning difficulty. -will require significant pain control/paralytics to eliminate vent dyssynchrony especially if patient is prone.  Status post Asystolic Arrest.   -Etiology of this is unclear.  May have been related to extreme hypoxia -10/14 echocardiogram shows worsening heart function see results below.    Acute on chronic systolic and diastolic CHF -Significantly reduced EF.  EF = 55% from echocardiogram 06/29/2018.  EF now 25 to 30%-also on previous echocardiogram mitral regurgitation read as moderate, now read as trace regurgitation.  -Patient cardiologist Dr. Armanda Magic.  Will consult cardiology, may need ICD placement once stable -Discussed case with Dr. Sherlean Foot cardiology.  She reviewed patient's  chart,  recommended medical management at this time.  See note from 10/15  Paroxysmal atrial fibrillation -Was on Eliquis (hold) while hospitalized. -10/16 Heparin IV  (Hold) see abdominal bleed  Bacteremia/positive Sherie Don Hominis -10/18 discussed case with ID Dr. Felipe Drone recommended ceftriaxone daily x7 days  Acute encephalopathy -Multifactorial, anoxic vs Covid 19 infection vs infection vs sedation. -Treat underlying cause  -EEG shows no seizure activity.  However shows diffuse encephalopathy see results below.- -Have been recommended by admitting physician use [place] sun for cooling protocol, however patient outside window upon admission to [facility]. -10/16 patient on minimal sedation no response to painful stimuli. -CT head negative acute findings see results below -See Covid pneumonia.  Abdominal distention -KUB negative for obstruction.  See results below -OG tube to low intermittent suction overnight, did not decompress patient.  This may be patient's body normal habitus.  Abdominal bleed -10/16 discussed case with radiologist abdominal/pelvic CT showed RIGHT side extraperitoneal hematoma and RIGHT flank hematoma -10/16 see A. Fib -10/17 transfused 2 units PRBC Recent Labs  Lab 03/03/19 0500 03/04/19 0353 03/04/19 0523 03/05/19 0418 03/05/19 0500  HGB 8.4* 8.5* 8.2* 9.2* 8.9*  -Currently appears stable  Hx chronic polymyalgia rheumatica -On chronic prednisone 5 to 20 mg daily at home. -Currently on Decadron for Covid pneumonia which will cover her chronic steroid use.   Hypokalemia -Potassium goal>4  Prediabetic -10/14 Hemoglobin A1c=6.0 -10/18 resistant SSI  HLD -10/18 LDL= 52 -10/19 Lipitor 40 mg daily    DVT prophylaxis: Heparin Code Status: Partial Family Communication: 10/20 spoke with [facility]  (husband).  Counseled him that patient's respiratory status was deteriorating despite maximal treatment efforts.  Counseled we were reaching the point  where, we need to consider transitioning to comfort care.  Stated he would talk the issue over with his son when he got off from work today.    Disposition Plan: TBD   Consultants:  PCCM Phone consult cardiology Phone consult ID Dr. Felipe Drone     Procedures/Significant Events:  Head CT 10/13 >> neg CT angio chest 10/13 >> neg PE, consolidation bilateral, with patchy infiltrates,  mildly displaced anterior rib fractures: Bilateral ribs 2, 3, 4, 5 (up to moderately displaced on series 11, image 75), 6, 7, and 8. Additional lateral 6th 7th and 8th right rib fractures (flail segment) with similar displacement 10/14 echocardiogram;Left Ventricle: EF= 25 to 30%. -The left ventricle has severely decreased function.  -Left ventricular diastolic Doppler parameters are consistent with pseudonormalization pattern of LV diastolic filling. Diffuse hypokinesis. Right Ventricle:  moderately enlarged. No increase in right ventricular wall thickness.  -Global RV systolic function is has moderately reduced systolic function. The tricuspid regurgitant velocity is 2.43 m/s, and with an assumed right atrial pressure of 8 mmHg, the estimated right ventricular systolic pressure is mildly elevated at 31.6 mmHg. Mitral Valve: The mitral valve is normal in structure. Trace mitral valve regurgitation. 10/14 EEG-;suggestive of profound diffuse encephalopathy, which could be secondary to sedated state, diffuse anoxic/hypoxic injury.  -No seizures or epileptiform discharges were seen  10/14 KUB; nonobstructive bowel gas pattern 10/14 PCXR;-mild cardiomegaly. -Fairly extensive bilateral interstitial and ground-glass opacities in addition to multifocal consolidations, interval worsening at the right upper lobe and bilateral lower lobes.  -No pneumothorax. 10/16 CT abdomen and pelvis Wo contrast;-Large, RIGHT pelvic sidewall extraperitoneal hematoma -RIGHT lower posterior flank hematoma. -Multiple bilateral acute  rib deformities are identified likely related to CPR. -Hepatic steatosis. 10/16 CT head  Wo contrast-negative acute infarct 10/17 transfused 2  units PRBC    I have personally reviewed and interpreted all radiology studies and my findings are as above.  VENTILATOR SETTINGS: Vent mode; PCV Rate set; 28 Vt Set;  FiO2; 70% PEEP; 14    Cultures 10/13 urine pending 10/13 blood LEFT hand NGTD 10/13 blood LEFT wrist positive Facklamia Hominis 10/14 tracheal aspirate normal flora 10/14 MRSA PCR negative      Antimicrobials: Anti-infectives (From admission, onward)   Start     Stop   03/03/19 1400  cefTRIAXone (ROCEPHIN) 2 g in sodium chloride 0.9 % 100 mL IVPB     03/10/19 1359   02/28/19 2200  vancomycin (VANCOCIN) 1,500 mg in sodium chloride 0.9 % 500 mL IVPB  Status:  Discontinued     02/28/19 1144   02/28/19 2000  vancomycin (VANCOCIN) IVPB 1000 mg/200 mL premix  Status:  Discontinued     03/03/19 1300   02/28/19 1000  levofloxacin (LEVAQUIN) IVPB 750 mg  Status:  Discontinued     02/28/19 1144   02/27/19 2200  remdesivir 100 mg in sodium chloride 0.9 % 250 mL IVPB     03/02/19 1753   02/27/19 1000  vancomycin (VANCOCIN) 2,500 mg in sodium chloride 0.9 % 500 mL IVPB     02/27/19 1416   02/27/19 0930  levofloxacin (LEVAQUIN) IVPB 500 mg  Status:  Discontinued     02/27/19 0932   02/27/19 0000  remdesivir 200 mg in sodium chloride 0.9 % 250 mL IVPB     02/27/19 0039   02/23/2019 1915  levofloxacin (LEVAQUIN) IVPB 500 mg     02/17/2019 2138   02/14/2019 1900  cefTRIAXone (ROCEPHIN) 1 g in sodium chloride 0.9 % 100 mL IVPB  Status:  Discontinued     02/16/2019 1902       Devices   LINES / TUBES:      Continuous Infusions: . cefTRIAXone (ROCEPHIN)  IV Stopped (03/04/19 1408)  . fentaNYL infusion INTRAVENOUS 200 mcg/hr (03/05/19 0543)     Objective: Vitals:   03/05/19 0500 03/05/19 0602 03/05/19 0651 03/05/19 0700  BP:  (!) 174/85 (!) 153/76 (!) 150/70  Pulse:   86 80 77  Resp:  Temp:      TempSrc:      SpO2:  90% 91% 90%  Weight: (!) 145.1 kg     Height:        Intake/Output Summary (Last 24 hours) at 03/05/2019 0753 Last data filed at 03/05/2019 0543 Gross per 24 hour  Intake 899.37 ml  Output 825 ml  Net 74.37 ml   Filed Weights   03/03/19 0500 03/04/19 0400 03/05/19 0500  Weight: (!) 148.9 kg (!) 151.1 kg (!) 145.1 kg   Physical Exam:  General: Sedated/intubated, positive acute respiratory distress Eyes: negative scleral hemorrhage, negative anisocoria, negative icterus ENT: Negative Runny nose, negative gingival bleeding, #7.5 ETT in place Neck:  Negative scars, masses, torticollis, lymphadenopathy, JVD Lungs: Diffuse decreased breath sounds, without wheezes or crackles Cardiovascular: Regular rate and rhythm without murmur gallop or rub normal S1 and S2 Abdomen: MORBIDLY OBESE,negative abdominal pain, nondistended, positive soft, bowel sounds, no rebound, no ascites, no appreciable mass Extremities: No significant cyanosis, clubbing, or edema bilateral lower extremities Skin: Negative rashes, lesions, ulcers Psychiatric: Intubated/sedated Central nervous system: Intubated/sedated        Data Reviewed: Care during the described time interval was provided by me .  I have reviewed this patient's available data, including medical history, events of note, physical  examination, and all test results as part of my evaluation.   CBC: Recent Labs  Lab 03/01/19 0455  03/02/19 0421 03/03/19 0500 03/04/19 0353 03/04/19 0523 03/05/19 0418 03/05/19 0500  WBC 9.6  --  12.8* 12.3*  --  14.5*  --  17.3*  NEUTROABS 7.2  --  9.4* 9.0*  --  9.9*  --  13.1*  HGB 7.7*   < > 6.8* 8.4* 8.5* 8.2* 9.2* 8.9*  HCT 23.8*   < > 21.3* 26.3* 25.0* 25.7* 27.0* 28.3*  MCV 105.3*  --  106.5* 99.6  --  101.2*  --  104.4*  PLT 180  --  193 196  --  192  --  183   < > = values in this interval not displayed.   Basic Metabolic Panel:  Recent Labs  Lab 03/01/19 0455 03/01/19 1545  03/02/19 0421 03/03/19 0500 03/03/19 0848 03/04/19 0353 03/04/19 0523 03/05/19 0418 03/05/19 0500  NA 138  --    < > 139  --  141 143 143 147* 148*  K 3.3* 3.7   < > 4.0  --  3.6 4.3 4.1 4.1 3.9  CL 102  --   --  104  --  109  --  111  --  114*  CO2 23  --   --  24  --  23  --  24  --  25  GLUCOSE 197*  --   --  186*  --  245*  --  194*  --  195*  BUN 30*  --   --  38*  --  44*  --  49*  --  54*  CREATININE 1.57*  --   --  1.68*  --  1.47*  --  1.43*  --  1.50*  CALCIUM 8.1*  --   --  8.0*  --  7.6*  --  8.1*  --  8.3*  MG 2.2 2.2  --  2.6* 2.5*  --   --  2.6*  --  2.7*  PHOS 4.0  --   --  3.3 3.4  --   --  3.2  --  4.2   < > = values in this interval not displayed.   GFR: Estimated Creatinine Clearance: 43.6 mL/min (A) (by C-G formula based on SCr of 1.5 mg/dL (H)). Liver Function Tests: Recent Labs  Lab 03/01/19 0455 03/02/19 0421 03/03/19 0848 03/04/19 0523 03/05/19 0500  AST 67* 84* 79* 61* 54*  ALT 51* 61* 67* 63* 61*  ALKPHOS 38 41 41 45 53  BILITOT 1.0 1.2 0.8 0.8 0.8  PROT 5.4* 5.4* 4.9* 5.1* 5.4*  ALBUMIN 2.4* 2.4* 2.2* 2.3* 2.4*   No results for input(s): LIPASE, AMYLASE in the last 168 hours. No results for input(s): AMMONIA in the last 168 hours. Coagulation Profile: Recent Labs  Lab 2018/12/12 2215  INR 1.3*   Cardiac Enzymes: No results for input(s): CKTOTAL, CKMB, CKMBINDEX, TROPONINI in the last 168 hours. BNP (last 3 results) No results for input(s): PROBNP in the last 8760 hours. HbA1C: No results for input(s): HGBA1C in the last 72 hours. CBG: Recent Labs  Lab 03/04/19 1533 03/04/19 1943 03/04/19 2342 03/05/19 0354 03/05/19 0738  GLUCAP 195* 241* 240* 179* 198*   Lipid Profile: Recent Labs    03/03/19 0848  CHOL 115  HDL 28*  LDLCALC 52  TRIG 130174*  CHOLHDL 4.1   Thyroid Function Tests: No results for input(s): TSH, T4TOTAL, FREET4, T3FREE, THYROIDAB in  the last 72 hours. Anemia  Panel: No results for input(s): VITAMINB12, FOLATE, FERRITIN, TIBC, IRON, RETICCTPCT in the last 72 hours. Urine analysis:    Component Value Date/Time   COLORURINE YELLOW 14-Mar-2019 1752   APPEARANCEUR HAZY (A) 03-14-2019 1752   LABSPEC 1.013 2019/03/14 1752   PHURINE 6.0 03-14-19 1752   GLUCOSEU NEGATIVE 03/14/2019 1752   HGBUR MODERATE (A) 03-14-19 1752   BILIRUBINUR NEGATIVE 03/14/2019 1752   KETONESUR 5 (A) 03/14/19 1752   PROTEINUR 100 (A) 03/14/19 1752   UROBILINOGEN 0.2 10/27/2006 1337   NITRITE NEGATIVE 03-14-2019 1752   LEUKOCYTESUR LARGE (A) March 14, 2019 1752   Sepsis Labs: (procalcitonin:4,lacticidven:4)  ) Recent Results (from the past 240 hour(s))  SARS CORONAVIRUS 2 (TAT 6-24 HRS) Nasopharyngeal Nasopharyngeal Swab     Status: Abnormal   Collection Time: 2019/03/14  5:42 PM   Specimen: Nasopharyngeal Swab  Result Value Ref Range Status   SARS Coronavirus 2 POSITIVE (A) NEGATIVE Final    Comment: RESULT CALLED TO, READ BACK BY AND VERIFIED WITH: Zola Button, RN AT [phone] ON 02/27/2019 BY SAINVILUS S (NOTE) SARS-CoV-2 target nucleic acids are DETECTED. The SARS-CoV-2 RNA is generally detectable in upper and lower respiratory specimens during the acute phase of infection. Positive results are indicative of active infection with SARS-CoV-2. Clinical  correlation with patient history and other diagnostic information is necessary to determine patient infection status. Positive results do  not rule out bacterial infection or co-infection with other viruses. The expected result is Negative. Fact Sheet for Patients: HairSlick.no Fact Sheet for Healthcare Providers: quierodirigir.com This test is not yet approved or cleared by the [place] FDA and  has been authorized for detection and/or diagnosis of SARS-CoV-2 by FDA under an Emergency Use Authorization (EUA). This EUA will remain  in effect  (meaning this test can b e used) for the duration of the COVID-19 declaration under Section 564(b)(1) of the Act, 21 U.S.C. section 360bbb-3(b)(1), unless the authorization is terminated or revoked sooner. Performed at [facility], [address] [place], [place] [address]   Urine Culture     Status: Abnormal   Collection Time: 14-Mar-2019  5:52 PM   Specimen: Urine, Clean Catch  Result Value Ref Range Status   Specimen Description   Final    URINE, CLEAN CATCH Performed at [facility], [address] [place], [place] [address]    Special Requests   Final    NONE Performed at [facility], [address] [place], [place] [address]    Culture >=100,000 COLONIES/mL AEROCOCCUS URINAE (A)  Final   Report Status 03/03/2019 FINAL  Final  Blood culture (routine x 2)     Status: None   Collection Time: 03-14-19  9:48 PM   Specimen: BLOOD  Result Value Ref Range Status   Specimen Description   Final    BLOOD BLOOD LEFT HAND Performed at [facility], [address], [place], [place] [address]    Special Requests   Final    BOTTLES DRAWN AEROBIC AND ANAEROBIC Blood Culture adequate volume Performed at [facility], [address], [place], [place] [address]    Culture   Final    NO GROWTH 5 DAYS Performed at [facility], [address], [place], [place] [address]    Report Status 03/03/2019 FINAL  Final  Blood culture (routine x 2)     Status: Abnormal   Collection Time: 2019/03/14  9:48 PM  Specimen: BLOOD  Result Value Ref Range Status   Specimen Description   Final    BLOOD BLOOD LEFT WRIST Performed at [facility] [address] [place], [place] [address]    Special Requests   Final    BOTTLES DRAWN AEROBIC AND ANAEROBIC Blood Culture adequate volume Performed at [facility] [address], [place], [place] [address]    Culture  Setup Time    Final    AEROBIC BOTTLE ONLY GRAM POSITIVE COCCI CRITICAL RESULT CALLED TO, READ BACK BY AND VERIFIED WITH: S CHRISTY PHARMD 02/28/19 JDW 1723    Culture (A)  Final    FACKLAMIA HOMINIS Standardized susceptibility testing for this organism is not available. STAPHYLOCOCCUS AURICULARIS THE SIGNIFICANCE OF ISOLATING THIS ORGANISM FROM A SINGLE SET OF BLOOD CULTURES WHEN MULTIPLE SETS ARE DRAWN IS UNCERTAIN. PLEASE NOTIFY THE MICROBIOLOGY DEPARTMENT WITHIN ONE WEEK IF SPECIATION AND SENSITIVITIES ARE REQUIRED. Performed at [facility], [place] [address], [place], [place] [address]    Report Status 03/04/2019 FINAL  Final  Culture, respiratory (non-expectorated)     Status: None   Collection Time: 02/27/19 11:30 AM   Specimen: Tracheal Aspirate; Respiratory  Result Value Ref Range Status   Specimen Description   Final    TRACHEAL ASPIRATE Performed at [facility] [address], [place], [place] [address]    Special Requests   Final    NONE Performed at [facility], [place] [address] [place], [place] [address]    Gram Stain   Final    RARE WBC PRESENT,BOTH PMN AND MONONUCLEAR RARE GRAM POSITIVE COCCI IN PAIRS    Culture   Final    FEW Consistent with normal respiratory flora. Performed at [facility], [place] [address] [place], [place] [address]    Report Status 03/02/2019 FINAL  Final  MRSA PCR Screening     Status: None   Collection Time: 02/27/19  5:20 PM   Specimen: Nasal Mucosa; Nasopharyngeal  Result Value Ref Range Status   MRSA by PCR NEGATIVE NEGATIVE Final    Comment:        The GeneXpert MRSA Assay (FDA approved for NASAL specimens only), is one component of a comprehensive MRSA colonization surveillance program. It is not intended to diagnose MRSA infection nor to guide or monitor treatment for MRSA infections. Performed at [facility], [place] [address], [place], [place] [address]           Radiology Studies: Dg Chest Port 1 View  Result Date: 03/05/2019 CLINICAL DATA:  Respiratory failure. EXAM: PORTABLE CHEST 1 VIEW COMPARISON:  Chest x-ray 02/28/2019. FINDINGS: Endotracheal tube, NG tube, right PICC line in stable position. Stable cardiomegaly. Diffuse bilateral pulmonary infiltrates again noted without interim change. Small left pleural effusion again cannot be excluded. No interim change. No pneumothorax. IMPRESSION: 1.  Lines and tubes in stable position. 2. Diffuse bilateral pulmonary infiltrates again noted without interim change. Small left pleural effusion again cannot be excluded. 3.  Stable cardiomegaly. Electronically Signed   By: [place]   On: 03/05/2019 07:26        Scheduled Meds: . atorvastatin  40 mg Per Tube q1800  . carvedilol  12.5 mg Per Tube BID WC  . chlorhexidine  15 mL Mouth/Throat BID  . Chlorhexidine Gluconate Cloth  6 each Topical Daily  . cholecalciferol  2,000 Units Oral Daily  . dexamethasone (DECADRON) injection  6 mg Intravenous Q24H  .  famotidine  20 mg Oral Daily  . feeding supplement (PRO-STAT SUGAR FREE 64)  30 mL Per Tube QID  . feeding supplement (VITAL AF 1.2 CAL)  1,000 mL Per Tube Q24H  . folic acid  1 mg Oral Daily  . gabapentin  300 mg Per Tube QHS  . insulin aspart  0-20 Units Subcutaneous Q4H  . lidocaine  1 patch Transdermal Q24H  . mouth rinse  15 mL Mouth Rinse 10 times per day  . mupirocin ointment   Nasal BID  . oxyCODONE  5 mg Per Tube Q6H  . sodium chloride flush  10-40 mL Intracatheter Q12H   Continuous Infusions: . cefTRIAXone (ROCEPHIN)  IV Stopped (03/04/19 1408)  . fentaNYL infusion INTRAVENOUS 200 mcg/hr (03/05/19 0543)     LOS: 7 days   The patient is critically ill with multiple organ systems failure and requires high complexity decision making for assessment and support, frequent evaluation and titration of therapies, application of advanced monitoring technologies and extensive interpretation  of multiple databases. Critical Care Time devoted to patient care services described in this note  Time spent: 40 minutes     , Roselind Messier, MD [facility] Pager [phone]  If 7PM-7AM, please contact night-coverage [email or web address] Password [email or web address] 03/05/2019, 7:53 AM

## 2019-03-05 NOTE — Plan of Care (Signed)

## 2019-03-05 NOTE — Progress Notes (Signed)
Son called to get update on POC - Provided updated and answered questions regarding EOL / Comfort care, visitation, and withdrawing from ventilator.  Son states they would like to give her another week to see if any improvements happen.

## 2019-03-06 ENCOUNTER — Other Ambulatory Visit: Payer: Self-pay

## 2019-03-06 ENCOUNTER — Inpatient Hospital Stay (HOSPITAL_COMMUNITY): Payer: HMO

## 2019-03-06 DIAGNOSIS — R0902 Hypoxemia: Secondary | ICD-10-CM

## 2019-03-06 DIAGNOSIS — R7881 Bacteremia: Secondary | ICD-10-CM | POA: Diagnosis not present

## 2019-03-06 DIAGNOSIS — U071 COVID-19: Secondary | ICD-10-CM | POA: Diagnosis not present

## 2019-03-06 DIAGNOSIS — I5043 Acute on chronic combined systolic (congestive) and diastolic (congestive) heart failure: Secondary | ICD-10-CM | POA: Diagnosis not present

## 2019-03-06 DIAGNOSIS — J9621 Acute and chronic respiratory failure with hypoxia: Secondary | ICD-10-CM | POA: Diagnosis not present

## 2019-03-06 DIAGNOSIS — G9340 Encephalopathy, unspecified: Secondary | ICD-10-CM | POA: Diagnosis not present

## 2019-03-06 DIAGNOSIS — R58 Hemorrhage, not elsewhere classified: Secondary | ICD-10-CM

## 2019-03-06 DIAGNOSIS — J80 Acute respiratory distress syndrome: Secondary | ICD-10-CM | POA: Diagnosis not present

## 2019-03-06 LAB — COMPREHENSIVE METABOLIC PANEL
ALT: 56 U/L — ABNORMAL HIGH (ref 0–44)
AST: 48 U/L — ABNORMAL HIGH (ref 15–41)
Albumin: 2.4 g/dL — ABNORMAL LOW (ref 3.5–5.0)
Alkaline Phosphatase: 51 U/L (ref 38–126)
Anion gap: 10 (ref 5–15)
BUN: 67 mg/dL — ABNORMAL HIGH (ref 8–23)
CO2: 25 mmol/L (ref 22–32)
Calcium: 8.3 mg/dL — ABNORMAL LOW (ref 8.9–10.3)
Chloride: 115 mmol/L — ABNORMAL HIGH (ref 98–111)
Creatinine, Ser: 1.71 mg/dL — ABNORMAL HIGH (ref 0.44–1.00)
GFR calc Af Amer: 32 mL/min — ABNORMAL LOW (ref >60)
GFR calc non Af Amer: 28 mL/min — ABNORMAL LOW (ref >60)
Glucose, Bld: 169 mg/dL — ABNORMAL HIGH (ref 70–99)
Potassium: 4 mmol/L (ref 3.5–5.1)
Sodium: 150 mmol/L — ABNORMAL HIGH (ref 135–145)
Total Bilirubin: 0.7 mg/dL (ref 0.3–1.2)
Total Protein: 5.6 g/dL — ABNORMAL LOW (ref 6.5–8.1)

## 2019-03-06 LAB — CBC WITH DIFFERENTIAL/PLATELET
Abs Immature Granulocytes: 1.81 10*3/uL — ABNORMAL HIGH (ref 0.00–0.07)
Basophils Absolute: 0.1 10*3/uL (ref 0.0–0.1)
Basophils Relative: 1 %
Eosinophils Absolute: 0 10*3/uL (ref 0.0–0.5)
Eosinophils Relative: 0 %
HCT: 28.2 % — ABNORMAL LOW (ref 36.0–46.0)
Hemoglobin: 8.6 g/dL — ABNORMAL LOW (ref 12.0–15.0)
Immature Granulocytes: 10 %
Lymphocytes Relative: 5 %
Lymphs Abs: 0.9 10*3/uL (ref 0.7–4.0)
MCH: 32.5 pg (ref 26.0–34.0)
MCHC: 30.5 g/dL (ref 30.0–36.0)
MCV: 106.4 fL — ABNORMAL HIGH (ref 80.0–100.0)
Monocytes Absolute: 2.9 10*3/uL — ABNORMAL HIGH (ref 0.1–1.0)
Monocytes Relative: 17 %
Neutro Abs: 11.8 10*3/uL — ABNORMAL HIGH (ref 1.7–7.7)
Neutrophils Relative %: 67 %
Platelets: 175 10*3/uL (ref 150–400)
RBC: 2.65 MIL/uL — ABNORMAL LOW (ref 3.87–5.11)
RDW: 19.1 % — ABNORMAL HIGH (ref 11.5–15.5)
WBC: 17.5 10*3/uL — ABNORMAL HIGH (ref 4.0–10.5)
nRBC: 0.5 % — ABNORMAL HIGH (ref 0.0–0.2)

## 2019-03-06 LAB — POCT I-STAT 7, (LYTES, BLD GAS, ICA,H+H)
Acid-base deficit: 1 mmol/L (ref 0.0–2.0)
Bicarbonate: 25.3 mmol/L (ref 20.0–28.0)
Calcium, Ion: 1.28 mmol/L (ref 1.15–1.40)
HCT: 27 % — ABNORMAL LOW (ref 36.0–46.0)
Hemoglobin: 9.2 g/dL — ABNORMAL LOW (ref 12.0–15.0)
O2 Saturation: 89 %
Patient temperature: 101.1
Potassium: 4.3 mmol/L (ref 3.5–5.1)
Sodium: 151 mmol/L — ABNORMAL HIGH (ref 135–145)
TCO2: 27 mmol/L (ref 22–32)
pCO2 arterial: 51 mmHg — ABNORMAL HIGH (ref 32.0–48.0)
pH, Arterial: 7.311 — ABNORMAL LOW (ref 7.350–7.450)
pO2, Arterial: 68 mmHg — ABNORMAL LOW (ref 83.0–108.0)

## 2019-03-06 LAB — GLUCOSE, CAPILLARY
Glucose-Capillary: 147 mg/dL — ABNORMAL HIGH (ref 70–99)
Glucose-Capillary: 164 mg/dL — ABNORMAL HIGH (ref 70–99)
Glucose-Capillary: 173 mg/dL — ABNORMAL HIGH (ref 70–99)
Glucose-Capillary: 187 mg/dL — ABNORMAL HIGH (ref 70–99)
Glucose-Capillary: 189 mg/dL — ABNORMAL HIGH (ref 70–99)
Glucose-Capillary: 220 mg/dL — ABNORMAL HIGH (ref 70–99)

## 2019-03-06 LAB — PHOSPHORUS: Phosphorus: 5.4 mg/dL — ABNORMAL HIGH (ref 2.5–4.6)

## 2019-03-06 LAB — MAGNESIUM: Magnesium: 2.8 mg/dL — ABNORMAL HIGH (ref 1.7–2.4)

## 2019-03-06 LAB — C-REACTIVE PROTEIN: CRP: 5.5 mg/dL — ABNORMAL HIGH (ref <1.0)

## 2019-03-06 LAB — D-DIMER, QUANTITATIVE: D-Dimer, Quant: >20 ug/mL-FEU — ABNORMAL HIGH (ref 0.00–0.50)

## 2019-03-06 MED ORDER — SODIUM CHLORIDE 0.9 % IV SOLN
INTRAVENOUS | Status: DC | PRN
  Administered 2019-03-06: 250 mL via INTRAVENOUS

## 2019-03-06 MED ORDER — FAMOTIDINE 40 MG/5ML PO SUSR
20.0000 mg | Freq: Every day | ORAL | Status: DC
  Administered 2019-03-07: 20 mg
  Filled 2019-03-06: qty 2.5

## 2019-03-06 MED ORDER — FOLIC ACID 1 MG PO TABS
1.0000 mg | ORAL_TABLET | Freq: Every day | ORAL | Status: DC
  Administered 2019-03-07: 1 mg
  Filled 2019-03-06: qty 1

## 2019-03-06 MED ORDER — INFLUENZA VAC A&B SA ADJ QUAD 0.5 ML IM PRSY
0.5000 mL | PREFILLED_SYRINGE | INTRAMUSCULAR | Status: DC | PRN
  Filled 2019-03-06: qty 0.5

## 2019-03-06 NOTE — Progress Notes (Signed)
[name] [name]  WUJ:[id number] DOB: 03/19/1941 DOA: 03/02/2019 PCP: Leanora Ivanoff., MD    Brief Narrative:  78 year old with a history of ascending aortic dilatation, chronic systolic and diastolic CHF, HTN, paroxysmal atrial fibrillation, moderate MVR, polymyalgia rheumatica, and chronic hypoxic respiratory failure on 2 L nasal cannula oxygen at all times who was diagnosed with Covid 10/12.  She presented to the ED with increasing shortness of breath and hypoxia requiring 12 L of oxygen support.  While in the ED she suffered a PEA arrest.  She recovered following 3 doses of epinephrine and ACLS care.  Significant Events: 10/12 diagnosed with Covid 10/13 intubated - admit to [facility] 10/14 TTE -EF 25-30% 10/16 found to have right flank hematoma 10/17 transfused 2 units PRBC  COVID-19 specific Treatment: Decadron 10/13 > Remdesivir 10/13 > 10/17  Subjective: Sedated on vent.  Does not appear to be uncomfortable.  No apparent acute distress.  Assessment & Plan:  Covid pneumonia - ARDS - acute on chronic hypoxic respiratory failure Vent management per PCCM -unable to prone given multiple rib fractures -has completed a course of remdesivir  Recent Labs  Lab 02/28/19 0350  03/02/19 0421 03/03/19 0500 03/03/19 0848 03/04/19 0503 03/04/19 0523 03/05/19 0500 03/06/19 0500  DDIMER 2.04*   < > 2.11* 2.71*  --   --  13.07* >20.00* >20.00*  CRP 11.1*   < > 4.0* 2.7*  --  1.6*  --  1.9* 5.5*  ALT 55*   < > 61*  --  67*  --  63* 61* 56*  PROCALCITON 65.54  --   --   --   --   --   --   --   --    < > = values in this interval not displayed.    Multiple bilateral rib fractures Presumed to be secondary to CPR  Status post asystolic/PEA arrest Likely hypoxic in etiology  Markedly elevated D-dimer D-dimer has been greater than 20 since 10/20 -unable to empirically fully anticoagulate due to significant flank hematoma -CT angio chest 10/14 noted no evidence of pulmonary  embolism  Chronic combined systolic and diastolic CHF Prior baseline EF 55% February 2020 -EF postarrest 25-30% -Case has been discussed with cardiology  Paroxysmal atrial fibrillation Chronically on Eliquis -presently covered with IV heparin  Facklamia hominis bacteremia Ceftriaxone x7 days per ID  Encephalopathy Likely a combination of anoxic injury from arrest as well as Covid infection -no seizure activity on EEG -CT head without acute findings  Extraperitoneal hematoma/right flank hematoma -acute blood loss anemia Transfused 2 units 10/17 -holding anticoagulation -hemoglobin stable  Chronic polymyalgia rheumatica Chronically on prednisone  Hypernatremia Increase free water via enteral route  DVT prophylaxis: Subcutaneous heparin Code Status: Limited code - no CPR if arrests again Family Communication: Per PCCM Disposition Plan: ICU  Consultants:  none  Antimicrobials:  Rocephin 10/18 >  Objective: Blood pressure 140/61, pulse 83, temperature 100.2 F (37.9 C), temperature source Axillary, resp. rate 20, height  (1.626 m), weight (!) 145.9 kg, SpO2 93 %.  Intake/Output Summary (Last 24 hours) at 03/06/2019 0900 Last data filed at 03/06/2019 0800 Gross per 24 hour  Intake 2138.81 ml  Output 1600 ml  Net 538.81 ml   Filed Weights   03/04/19 0400 03/05/19 0500 03/06/19 0500  Weight: (!) 151.1 kg (!) 145.1 kg (!) 145.9 kg    Examination: General: No acute respiratory distress Lungs: Fine crackles throughout Cardiovascular: Regular rate without murmur gallop or rub normal  S1 and S2 Abdomen: Nontender, nondistended, soft, bowel sounds positive, no rebound, no ascites, no appreciable mass Extremities: 1+ bilateral lower extremity edema  CBC: Recent Labs  Lab 03/04/19 0523  03/05/19 0500 03/06/19 0046 03/06/19 0500  WBC 14.5*  --  17.3*  --  17.5*  NEUTROABS 9.9*  --  13.1*  --  11.8*  HGB 8.2*   < > 8.9* 9.2* 8.6*  HCT 25.7*   < > 28.3* 27.0* 28.2*   MCV 101.2*  --  104.4*  --  106.4*  PLT 192  --  183  --  175   < > = values in this interval not displayed.   Basic Metabolic Panel: Recent Labs  Lab 03/04/19 0523  03/05/19 0500 03/06/19 0046 03/06/19 0500  NA 143   < > 148* 151* 150*  K 4.1   < > 3.9 4.3 4.0  CL 111  --  114*  --  115*  CO2 24  --  25  --  25  GLUCOSE 194*  --  195*  --  169*  BUN 49*  --  54*  --  67*  CREATININE 1.43*  --  1.50*  --  1.71*  CALCIUM 8.1*  --  8.3*  --  8.3*  MG 2.6*  --  2.7*  --  2.8*  PHOS 3.2  --  4.2  --  5.4*   < > = values in this interval not displayed.   GFR: Estimated Creatinine Clearance: 38.4 mL/min (A) (by C-G formula based on SCr of 1.71 mg/dL (H)).  Liver Function Tests: Recent Labs  Lab 03/03/19 0848 03/04/19 0523 03/05/19 0500 03/06/19 0500  AST 79* 61* 54* 48*  ALT 67* 63* 61* 56*  ALKPHOS 41 45 53 51  BILITOT 0.8 0.8 0.8 0.7  PROT 4.9* 5.1* 5.4* 5.6*  ALBUMIN 2.2* 2.3* 2.4* 2.4*    HbA1C: Hgb A1c MFr Bld  Date/Time Value Ref Range Status  02/27/2019 02:43 AM 6.0 (H) 4.8 - 5.6 % Final    Comment:    (NOTE) Pre diabetes:          5.7%-6.4% Diabetes:              >6.4% Glycemic control for   <7.0% adults with diabetes     CBG: Recent Labs  Lab 03/05/19 1606 03/05/19 2018 03/05/19 2349 03/06/19 0417 03/06/19 0741  GLUCAP 225* 176* 189* 164* 147*    Recent Results (from the past 240 hour(s))  SARS CORONAVIRUS 2 (TAT 6-24 HRS) Nasopharyngeal Nasopharyngeal Swab     Status: Abnormal   Collection Time: 10-27-18  5:42 PM   Specimen: Nasopharyngeal Swab  Result Value Ref Range Status   SARS Coronavirus 2 POSITIVE (A) NEGATIVE Final    Comment: RESULT CALLED TO, READ BACK BY AND VERIFIED WITH: Zola ButtonINNAN J, RN AT [phone] ON 02/27/2019 BY SAINVILUS S (NOTE) SARS-CoV-2 target nucleic acids are DETECTED. The SARS-CoV-2 RNA is generally detectable in upper and lower respiratory specimens during the acute phase of infection. Positive results are indicative  of active infection with SARS-CoV-2. Clinical  correlation with patient history and other diagnostic information is necessary to determine patient infection status. Positive results do  not rule out bacterial infection or co-infection with other viruses. The expected result is Negative. Fact Sheet for Patients: HairSlick.nohttps://www.fda.gov/media/138098/download Fact Sheet for Healthcare Providers: quierodirigir.comhttps://www.fda.gov/media/138095/download This test is not yet approved or cleared by the [place] FDA and  has been authorized for detection and/or diagnosis of SARS-CoV-2  by FDA under an Emergency Use Authorization (EUA). This EUA will remain  in effect (meaning this test can b e used) for the duration of the COVID-19 declaration under Section 564(b)(1) of the Act, 21 U.S.C. section 360bbb-3(b)(1), unless the authorization is terminated or revoked sooner. Performed at [facility], [address], [place], [place] [address]   Urine Culture     Status: Abnormal   Collection Time: 03/13/2019  5:52 PM   Specimen: Urine, Clean Catch  Result Value Ref Range Status   Specimen Description   Final    URINE, CLEAN CATCH Performed at [facility], [address] [place], [place] [address]    Special Requests   Final    NONE Performed at [facility], [address], [place], [place] [address]    Culture >=100,000 COLONIES/mL AEROCOCCUS URINAE (A)  Final   Report Status 03/03/2019 FINAL  Final  Blood culture (routine x 2)     Status: None   Collection Time: 02/25/2019  9:48 PM   Specimen: BLOOD  Result Value Ref Range Status   Specimen Description   Final    BLOOD BLOOD LEFT HAND Performed at [facility], [address], [place], [place] [address]    Special Requests   Final    BOTTLES DRAWN AEROBIC AND ANAEROBIC Blood Culture adequate volume Performed at [facility], [address], [place], [place] [address]      Culture   Final    NO GROWTH 5 DAYS Performed at [facility], [address], [place], [place] [address]    Report Status 03/03/2019 FINAL  Final  Blood culture (routine x 2)     Status: Abnormal   Collection Time: 03/12/2019  9:48 PM   Specimen: BLOOD  Result Value Ref Range Status   Specimen Description   Final    BLOOD BLOOD LEFT WRIST Performed at [facility], [address], [place], [place] [address]    Special Requests   Final    BOTTLES DRAWN AEROBIC AND ANAEROBIC Blood Culture adequate volume Performed at [facility], [address] [place], [place] [address]    Culture  Setup Time   Final    AEROBIC BOTTLE ONLY GRAM POSITIVE COCCI CRITICAL RESULT CALLED TO, READ BACK BY AND VERIFIED WITH: S CHRISTY PHARMD 02/28/19 JDW 1723    Culture (A)  Final    FACKLAMIA HOMINIS Standardized susceptibility testing for this organism is not available. STAPHYLOCOCCUS AURICULARIS THE SIGNIFICANCE OF ISOLATING THIS ORGANISM FROM A SINGLE SET OF BLOOD CULTURES WHEN MULTIPLE SETS ARE DRAWN IS UNCERTAIN. PLEASE NOTIFY THE MICROBIOLOGY DEPARTMENT WITHIN ONE WEEK IF SPECIATION AND SENSITIVITIES ARE REQUIRED. Performed at [facility], [address], [place], [place] [address]    Report Status 03/04/2019 FINAL  Final  Culture, respiratory (non-expectorated)     Status: None   Collection Time: 02/27/19 11:30 AM   Specimen: Tracheal Aspirate; Respiratory  Result Value Ref Range Status   Specimen Description   Final    TRACHEAL ASPIRATE Performed at [facility], [address], [place], [place] [address]    Special Requests   Final    NONE Performed at [facility], [address] [place], [place] [address]    Gram Stain   Final    RARE WBC PRESENT,BOTH PMN AND MONONUCLEAR RARE GRAM POSITIVE COCCI IN PAIRS    Culture  Final    FEW Consistent with normal respiratory flora. Performed at [facility], [place] [address], [place], [place] [address]    Report Status 03/02/2019 FINAL  Final  MRSA PCR Screening     Status: None   Collection Time: 02/27/19  5:20 PM   Specimen: Nasal Mucosa; Nasopharyngeal  Result Value Ref Range Status   MRSA by PCR NEGATIVE NEGATIVE Final    Comment:        The GeneXpert MRSA Assay (FDA approved for NASAL specimens only), is one component of a comprehensive MRSA colonization surveillance program. It is not intended to diagnose MRSA infection nor to guide or monitor treatment for MRSA infections. Performed at [facility], [place] [address], [place], [place] [address]      Scheduled Meds:  atorvastatin  40 mg Per Tube q1800   carvedilol  12.5 mg Per Tube BID WC   chlorhexidine  15 mL Mouth/Throat BID   Chlorhexidine Gluconate Cloth  6 each Topical Daily   cholecalciferol  2,000 Units Oral Daily   dexamethasone (DECADRON) injection  6 mg Intravenous Q24H   famotidine  20 mg Oral Daily   feeding supplement (PRO-STAT SUGAR FREE 64)  30 mL Per Tube QID   feeding supplement (VITAL AF 1.2 CAL)  1,000 mL Per Tube B28U   folic acid  1 mg Oral Daily   gabapentin  300 mg Per Tube QHS   heparin injection (subcutaneous)  10,000 Units Subcutaneous Q8H   insulin aspart  0-20 Units Subcutaneous Q4H   insulin aspart  2 Units Subcutaneous Q4H   lidocaine  1 patch Transdermal Q24H   mouth rinse  15 mL Mouth Rinse 10 times per day   mupirocin ointment   Nasal BID   oxyCODONE  10 mg Per Tube Q6H   sodium chloride flush  10-40 mL Intracatheter Q12H   Continuous Infusions:  cefTRIAXone (ROCEPHIN)  IV Stopped (03/05/19 1342)   fentaNYL infusion INTRAVENOUS 200 mcg/hr (03/06/19 0800)     LOS: 8 days   Cherene Altes, MD [facility] Office  [phone] Pager - Text Page per Shea Evans  If 7PM-7AM, please contact night-coverage per Amion 03/06/2019, 9:00 AM

## 2019-03-06 NOTE — Patient Outreach (Signed)
  [facility]) Care Management Chronic Special Needs Program   03/06/2019  Name: [name] [name], DOB: 02/09/1941  MRN: [id number]  The client was discussed in today's interdisciplinary care team meeting.  The following issues were discussed:  Client's needs, Changes in health status, Care Plan, Coordination of care and Care transitions  Participants present:                    Thea Silversmith, MSN, RN, CCM                     Melissa Sandlin RN,BSN,CCM, CDE  Kelli Churn, RN, CCM, CDE Quinn Plowman RN, BSN, CCM            Maryella Shivers, MD            Gilda Crease, PharmD, RPh Bary Castilla, RN, BSN, MS, CCM Coralie Carpen, MD  Plan: continue care coordination with hospital liaison as needed.   Thea Silversmith, RN, MSN, [place] [phone]

## 2019-03-06 NOTE — Progress Notes (Signed)
NAME:  [name] [name], MRN:  [id number], DOB:  06/09/1940, LOS: 8 ADMISSION DATE:  03/02/19, CONSULTATION DATE:  10/15 REFERRING MD:  EDP, CHIEF COMPLAINT:  Dyspnea   Brief History   78 year old female presented to the [facility] emergency room with dyspnea and hypoxemia from COVID-19 pneumonia, developed PEA/asystolic cardiac arrest.  Required CPR for about 15 minutes.  Transferred to [facility] afterwards.   Past Medical History  Polymyalgia rheumatica Inflammatory arthritis Ascending aorta dilation Hypertension Mitral regurgitation Atrial fibrillation  Significant Hospital Events   10/13 Admit 10/14 transfer to [facility], rocuronium x one dose for vent dyssynchrony 10/16 progressive anemia; found to have Rt sided hematoma on CT abd/pelvis; heparin held 10/17 transfuse 2 units PRBC  Consults:  PCCM  Procedures:  ETT 10/13 >  R PICC 10/14 >   Significant Diagnostic Tests:  EEG 10/14 >> background suppression, intermittent generalized slowing Echo 10/14 >> EF 25 to 30%, mod RV systolic dysfx CT head 10/14 >> mild atrophy, mild small vessel ischemic changes of white matter CT angio chest 10/14 >> coronary calcification, b/l consolidation and GGO, b/l anterior rib fx's 2/3/4/5/6/7/8, lateral rib fx's Rt 6/7/8 CT abd/pelvis 10/16 >> fatty liver, large Rt pelvic sidewall extraperitoneal hematoma 18.1 x 5.6 cm, Rt lower posterior flank hematoma 9.8 x 5.3 x 4.7 cm CT head 10/16 >> no acute process  Micro Data:  10/13 SARS COV2 >> POSITIVE 10/13 Blood >> Facklamia hominis, Staph auricularis 10/13 urine >> Aerococcus 10/14 Sputum >> oral flora  Antimicrobials/COVID Rx:   Decadron 10/13 >> Remdesivir 10/13 >> 10/17 Levaquin 10/13 >> 10/15 Vancomycin 10/14 >> 10/17 Rocephin 10/18 >>   Interim history/subjective:   No change in vent settings No visible bruising or bleeding No purposeful movement Fever in last 24 hours WBC rising Vent dyssynchrony  overnight lead to worsening hypoxemia, vent settings were adjusted  Objective   Blood pressure 140/61, pulse 83, temperature 100.2 F (37.9 C), temperature source Axillary, resp. rate 20, height 5\' 4"  (1.626 m), weight (!) 145.9 kg, SpO2 93 %.    Vent Mode: PCV FiO2 (%):  [70 %-100 %] 70 % Set Rate:  [28 bmp] 28 bmp PEEP:  [14 cmH20] 14 cmH20 Plateau Pressure:  [21 cmH20-45 cmH20] 23 cmH20   Intake/Output Summary (Last 24 hours) at 03/06/2019 0853 Last data filed at 03/06/2019 0800 Gross per 24 hour  Intake 2138.81 ml  Output 1600 ml  Net 538.81 ml   Filed Weights   03/04/19 0400 03/05/19 0500 03/06/19 0500  Weight: (!) 151.1 kg (!) 145.1 kg (!) 145.9 kg    Examination:  General:  Morbidly obese, in bed on vent HENT: NCAT ETT in place PULM: Crackles bases B, vent supported breathing CV: RRR, no mgr GI: BS+, soft, nontender MSK: normal bulk and tone Derm: no bruising noted, thin skin, some mild edema legs Neuro: sedated on vent, doesn't respond to painful stimuli, no eye opening  10/21 CXR port images reviewed: ETT at level of clavicle, bilateral airspace and interstitial disease, bilateral effusions  Resolved Hospital Problem list     Assessment & Plan:  ARDS from COVID 19 pneumonia: No major changes in last few days Complicated by flail chest Continue mechanical ventilation per ARDS protocol Target TVol 6-8cc/kgIBW Target Plateau Pressure < 30cm H20 Target driving pressure less than 15 cm of water Target PaO2 55-65: titrate PEEP/FiO2 per protocol As long as PaO2 to FiO2 ratio is less than 1:150 position in prone position for 16 hours a  day Check CVP daily if CVL in place Target CVP less than 4, diurese as necessary Ventilator associated pneumonia prevention protocol 10/21 hold lasix with worsening renal function  Fever: due to ARDS? Concerning with rising WBC  Repeat blood culture and resp culture Continue current antibiotics  Acute systolic heart failure   Hold diuresis today  Bacteremia : Facklamia hominis, Staph auricularis Continue ceftriaxone per ID curbside recommendations Repeat blood culture with ongoing fever  Acute encephalopathy : worrisome for anoxic brain injury Would like to wean off sedation, but we are limited by severe ventilator dyssynchrony RASS target 0 to -1 Fentanyl infusion per PAD protocol  Acute blood loss anemia: flank bleed Monitor bleeding Monitor for bleeding Transfuse PRBC for Hgb < 7 gm/dL  Prognosis: poor.  Unclear mental status without great ability to assess given need for sedation.  She has made no progress on ventilator.  I updated her son today and he doesn't feel that she would want to live for a long period of time on the ventilator like this.  He says that the family is in agreement about this. They have been hoping for improvement after the blood transfusion.  Amada JupiterDale tells me today that he will discuss withdrawal of care with his family and let us know tomorrow.  CODE STATUS FULL DNR   Best practice:  Diet: tube feeding Pain/Anxiety/Delirium protocol (if indicated): yes, RASS goal 0 to -1 VAP protocol (if indicated): yes DVT prophylaxis: sub cutaneous heparin GI prophylaxis: famotidine Glucose control: SSI Mobility: bed rest Code Status: DNR Family Communication: I spoke to Amada JupiterDale her son and they are interested in comfort measures, likely tomorrow. Disposition: remain in ICU  Labs   CBC: Recent Labs  Lab 03/02/19 0421 03/03/19 0500  03/04/19 0523 03/05/19 0418 03/05/19 0500 03/06/19 0046 03/06/19 0500  WBC 12.8* 12.3*  --  14.5*  --  17.3*  --  17.5*  NEUTROABS 9.4* 9.0*  --  9.9*  --  13.1*  --  11.8*  HGB 6.8* 8.4*   < > 8.2* 9.2* 8.9* 9.2* 8.6*  HCT 21.3* 26.3*   < > 25.7* 27.0* 28.3* 27.0* 28.2*  MCV 106.5* 99.6  --  101.2*  --  104.4*  --  106.4*  PLT 193 196  --  192  --  183  --  175   < > = values in this interval not displayed.    Basic Metabolic Panel: Recent Labs  Lab  03/02/19 0421 03/03/19 0500 03/03/19 0848  03/04/19 0523 03/05/19 0418 03/05/19 0500 03/06/19 0046 03/06/19 0500  NA 139  --  141   < > 143 147* 148* 151* 150*  K 4.0  --  3.6   < > 4.1 4.1 3.9 4.3 4.0  CL 104  --  109  --  111  --  114*  --  115*  CO2 24  --  23  --  24  --  25  --  25  GLUCOSE 186*  --  245*  --  194*  --  195*  --  169*  BUN 38*  --  44*  --  49*  --  54*  --  67*  CREATININE 1.68*  --  1.47*  --  1.43*  --  1.50*  --  1.71*  CALCIUM 8.0*  --  7.6*  --  8.1*  --  8.3*  --  8.3*  MG 2.6* 2.5*  --   --  2.6*  --  2.7*  --  2.8*  PHOS 3.3 3.4  --   --  3.2  --  4.2  --  5.4*   < > = values in this interval not displayed.   GFR: Estimated Creatinine Clearance: 38.4 mL/min (A) (by C-G formula based on SCr of 1.71 mg/dL (H)). Recent Labs  Lab 02/28/19 0350  03/03/19 0500 03/04/19 0523 03/05/19 0500 03/06/19 0500  PROCALCITON 65.54  --   --   --   --   --   WBC 9.9   < > 12.3* 14.5* 17.3* 17.5*   < > = values in this interval not displayed.    Liver Function Tests: Recent Labs  Lab 03/02/19 0421 03/03/19 0848 03/04/19 0523 03/05/19 0500 03/06/19 0500  AST 84* 79* 61* 54* 48*  ALT 61* 67* 63* 61* 56*  ALKPHOS 41 41 45 53 51  BILITOT 1.2 0.8 0.8 0.8 0.7  PROT 5.4* 4.9* 5.1* 5.4* 5.6*  ALBUMIN 2.4* 2.2* 2.3* 2.4* 2.4*   No results for input(s): LIPASE, AMYLASE in the last 168 hours. No results for input(s): AMMONIA in the last 168 hours.  ABG    Component Value Date/Time   PHART 7.311 (L) 03/06/2019 0046   PCO2ART 51.0 (H) 03/06/2019 0046   PO2ART 68.0 (L) 03/06/2019 0046   HCO3 25.3 03/06/2019 0046   TCO2 27 03/06/2019 0046   ACIDBASEDEF 1.0 03/06/2019 0046   O2SAT 89.0 03/06/2019 0046     Coagulation Profile: No results for input(s): INR, PROTIME in the last 168 hours.  Cardiac Enzymes: No results for input(s): CKTOTAL, CKMB, CKMBINDEX, TROPONINI in the last 168 hours.  HbA1C: Hgb A1c MFr Bld  Date/Time Value Ref Range Status   02/27/2019 02:43 AM 6.0 (H) 4.8 - 5.6 % Final    Comment:    (NOTE) Pre diabetes:          5.7%-6.4% Diabetes:              >6.4% Glycemic control for   <7.0% adults with diabetes     CBG: Recent Labs  Lab 03/05/19 1606 03/05/19 2018 03/05/19 2349 03/06/19 0417 03/06/19 0741  GLUCAP 225* 176* 189* 164* 147*     Critical care time: 40 minutes     Roselie Awkward, MD [place] [facility] Pager: [phone] Cell: [phone] If no response, call [phone]

## 2019-03-06 NOTE — Progress Notes (Signed)
ABG obtained at this time d/t desat with increase need of FiO2 and desynchrony with vent.

## 2019-03-06 NOTE — Procedures (Signed)
Cortrak  Person Inserting Tube:  Nawaf Strange C, RD Tube Type:  Cortrak - 43 inches Tube Location:  Left nare Initial Placement:  Stomach Secured by: Bridle Technique Used to Measure Tube Placement:  Documented cm marking at nare/ corner of mouth Cortrak Secured At:  66 cm    Cortrak Tube Team Note:  Consult received to place a Cortrak feeding tube.   No x-ray is required. RN may begin using tube.   If the tube becomes dislodged please keep the tube and contact the Cortrak team at [email or web address] ([email or web address]) for replacement.  If after hours and replacement cannot be delayed, place a NG tube and confirm placement with an abdominal x-ray.    [name] [name] RD, LDN, CNSC [phone] Pager [phone] After Hours Pager   

## 2019-03-07 DIAGNOSIS — I5043 Acute on chronic combined systolic (congestive) and diastolic (congestive) heart failure: Secondary | ICD-10-CM | POA: Diagnosis not present

## 2019-03-07 DIAGNOSIS — U071 COVID-19: Secondary | ICD-10-CM | POA: Diagnosis not present

## 2019-03-07 DIAGNOSIS — J80 Acute respiratory distress syndrome: Secondary | ICD-10-CM | POA: Diagnosis not present

## 2019-03-07 DIAGNOSIS — I469 Cardiac arrest, cause unspecified: Secondary | ICD-10-CM | POA: Diagnosis not present

## 2019-03-07 DIAGNOSIS — J9601 Acute respiratory failure with hypoxia: Secondary | ICD-10-CM | POA: Diagnosis not present

## 2019-03-07 DIAGNOSIS — R58 Hemorrhage, not elsewhere classified: Secondary | ICD-10-CM | POA: Diagnosis not present

## 2019-03-07 DIAGNOSIS — R7881 Bacteremia: Secondary | ICD-10-CM | POA: Diagnosis not present

## 2019-03-07 DIAGNOSIS — J9621 Acute and chronic respiratory failure with hypoxia: Secondary | ICD-10-CM | POA: Diagnosis not present

## 2019-03-07 LAB — COMPREHENSIVE METABOLIC PANEL
ALT: 56 U/L — ABNORMAL HIGH (ref 0–44)
AST: 50 U/L — ABNORMAL HIGH (ref 15–41)
Albumin: 2.1 g/dL — ABNORMAL LOW (ref 3.5–5.0)
Alkaline Phosphatase: 51 U/L (ref 38–126)
Anion gap: 9 (ref 5–15)
BUN: 80 mg/dL — ABNORMAL HIGH (ref 8–23)
CO2: 25 mmol/L (ref 22–32)
Calcium: 8.2 mg/dL — ABNORMAL LOW (ref 8.9–10.3)
Chloride: 120 mmol/L — ABNORMAL HIGH (ref 98–111)
Creatinine, Ser: 1.98 mg/dL — ABNORMAL HIGH (ref 0.44–1.00)
GFR calc Af Amer: 27 mL/min — ABNORMAL LOW (ref >60)
GFR calc non Af Amer: 23 mL/min — ABNORMAL LOW (ref >60)
Glucose, Bld: 178 mg/dL — ABNORMAL HIGH (ref 70–99)
Potassium: 4 mmol/L (ref 3.5–5.1)
Sodium: 154 mmol/L — ABNORMAL HIGH (ref 135–145)
Total Bilirubin: 0.7 mg/dL (ref 0.3–1.2)
Total Protein: 5.3 g/dL — ABNORMAL LOW (ref 6.5–8.1)

## 2019-03-07 LAB — CBC WITH DIFFERENTIAL/PLATELET
Abs Immature Granulocytes: 1.24 10*3/uL — ABNORMAL HIGH (ref 0.00–0.07)
Basophils Absolute: 0.1 10*3/uL (ref 0.0–0.1)
Basophils Relative: 0 %
Eosinophils Absolute: 0 10*3/uL (ref 0.0–0.5)
Eosinophils Relative: 0 %
HCT: 28 % — ABNORMAL LOW (ref 36.0–46.0)
Hemoglobin: 8.4 g/dL — ABNORMAL LOW (ref 12.0–15.0)
Immature Granulocytes: 7 %
Lymphocytes Relative: 5 %
Lymphs Abs: 0.8 10*3/uL (ref 0.7–4.0)
MCH: 32.2 pg (ref 26.0–34.0)
MCHC: 30 g/dL (ref 30.0–36.0)
MCV: 107.3 fL — ABNORMAL HIGH (ref 80.0–100.0)
Monocytes Absolute: 2.7 10*3/uL — ABNORMAL HIGH (ref 0.1–1.0)
Monocytes Relative: 16 %
Neutro Abs: 12.1 10*3/uL — ABNORMAL HIGH (ref 1.7–7.7)
Neutrophils Relative %: 72 %
Platelets: 164 10*3/uL (ref 150–400)
RBC: 2.61 MIL/uL — ABNORMAL LOW (ref 3.87–5.11)
RDW: 19.2 % — ABNORMAL HIGH (ref 11.5–15.5)
WBC: 16.8 10*3/uL — ABNORMAL HIGH (ref 4.0–10.5)
nRBC: 0.3 % — ABNORMAL HIGH (ref 0.0–0.2)

## 2019-03-07 LAB — GLUCOSE, CAPILLARY
Glucose-Capillary: 163 mg/dL — ABNORMAL HIGH (ref 70–99)
Glucose-Capillary: 170 mg/dL — ABNORMAL HIGH (ref 70–99)
Glucose-Capillary: 176 mg/dL — ABNORMAL HIGH (ref 70–99)
Glucose-Capillary: 189 mg/dL — ABNORMAL HIGH (ref 70–99)
Glucose-Capillary: 206 mg/dL — ABNORMAL HIGH (ref 70–99)

## 2019-03-07 LAB — C-REACTIVE PROTEIN: CRP: 7.9 mg/dL — ABNORMAL HIGH (ref <1.0)

## 2019-03-07 LAB — D-DIMER, QUANTITATIVE: D-Dimer, Quant: 10.83 ug/mL-FEU — ABNORMAL HIGH (ref 0.00–0.50)

## 2019-03-07 MED ORDER — METOPROLOL TARTRATE 5 MG/5ML IV SOLN
2.5000 mg | INTRAVENOUS | Status: DC | PRN
  Administered 2019-03-07: 2.5 mg via INTRAVENOUS
  Filled 2019-03-07: qty 5

## 2019-03-07 MED ORDER — MORPHINE SULFATE (PF) 2 MG/ML IV SOLN
2.0000 mg | INTRAVENOUS | Status: DC | PRN
  Administered 2019-03-07: 2 mg via INTRAVENOUS
  Filled 2019-03-07: qty 1

## 2019-03-07 MED ORDER — GLYCOPYRROLATE 1 MG PO TABS
1.0000 mg | ORAL_TABLET | ORAL | Status: DC | PRN
  Filled 2019-03-07: qty 1

## 2019-03-07 MED ORDER — POLYVINYL ALCOHOL 1.4 % OP SOLN
1.0000 [drp] | Freq: Four times a day (QID) | OPHTHALMIC | Status: DC | PRN
  Filled 2019-03-07: qty 15

## 2019-03-07 MED ORDER — MORPHINE BOLUS VIA INFUSION
5.0000 mg | INTRAVENOUS | Status: DC | PRN
  Filled 2019-03-07: qty 5

## 2019-03-07 MED ORDER — ACETAMINOPHEN 650 MG RE SUPP: 650.0000 mg | Freq: Four times a day (QID) | RECTAL | Status: DC | PRN

## 2019-03-07 MED ORDER — DIPHENHYDRAMINE HCL 50 MG/ML IJ SOLN: 25.0000 mg | INTRAMUSCULAR | Status: DC | PRN

## 2019-03-07 MED ORDER — GLYCOPYRROLATE 0.2 MG/ML IJ SOLN: 0.2000 mg | INTRAMUSCULAR | Status: DC | PRN

## 2019-03-07 MED ORDER — MORPHINE 100MG IN NS 100ML (1MG/ML) PREMIX INFUSION
0.0000 mg/h | INTRAVENOUS | Status: DC
  Administered 2019-03-07: 5 mg/h via INTRAVENOUS
  Filled 2019-03-07: qty 100

## 2019-03-07 MED ORDER — ACETAMINOPHEN 325 MG PO TABS: 650.0000 mg | ORAL_TABLET | Freq: Four times a day (QID) | ORAL | Status: DC | PRN

## 2019-03-07 MED ORDER — MORPHINE SULFATE (PF) 2 MG/ML IV SOLN: 5.0000 mg | INTRAVENOUS | Status: DC | PRN

## 2019-03-07 MED ORDER — LORAZEPAM 2 MG/ML IJ SOLN
2.0000 mg | INTRAMUSCULAR | Status: DC | PRN
  Administered 2019-03-07: 2 mg via INTRAVENOUS
  Filled 2019-03-07: qty 1

## 2019-03-07 MED ORDER — DEXTROSE 5 % IV SOLN: INTRAVENOUS | Status: DC

## 2019-03-09 LAB — CULTURE, RESPIRATORY W GRAM STAIN: Culture: NORMAL

## 2019-03-11 ENCOUNTER — Other Ambulatory Visit: Payer: Self-pay

## 2019-03-11 NOTE — Patient Outreach (Signed)
  [facility]) Care Management Chronic Special Needs Program    03/11/2019  Name: [name] [name], DOB: Jun 07, 1940  MRN: [id number]   Ms. [name] [name] is enrolled in a chronic special needs plan. RNCM received notification client passed away on Mar 21, 2019.Case closed.  Plan: case closure letter sent to primary care.  Thea Silversmith, RN, MSN, [place] [phone]

## 2019-03-17 NOTE — Progress Notes (Signed)
eLink Physician-Brief Progress Note Patient Name: [name] [name] DOB: Jan 10, 1941 MRN: [id number]   Date of Service  03/11/2019  HPI/Events of Note  Pt with paroxysmal Afib with heart rate 120's to 130's.  eICU Interventions  Metoprolol 2.5 mg iv Q 6 hours prn heart rate > 115        [place]wo U Ogan 11-Mar-2019, 6:33 AM

## 2019-03-17 NOTE — Progress Notes (Signed)
NAME:  [name] RocheBetty Beilfuss, MRN:  [id number], DOB:  08/09/1940, LOS: 9 ADMISSION DATE:  02/15/2019, CONSULTATION DATE:  10/15 REFERRING MD:  EDP, CHIEF COMPLAINT:  Dyspnea   Brief History   78 year old female [facility] room with dyspnea and hypoxemia from COVID-19 pneumonia, developed PEA/asystolic cardiac arrest.  Required CPR for about 15 minutes.  [facility].  Past Medical History  Polymyalgia rheumatica Inflammatory arthritis Ascending aorta dilation Hypertension Mitral regurgitation Atrial fibrillation  Significant Hospital Events   10/13 Admit 10/14 [facility], rocuronium x one dose for vent dyssynchrony 10/16 progressive anemia; found to have Rt sided hematoma on CT abd/pelvis; heparin held 10/17 transfuse 2 units PRBC  Consults:  PCCM  Procedures:  ETT 10/13 >  R PICC 10/14 >   Significant Diagnostic Tests:  EEG 10/14 >> background suppression, intermittent generalized slowing Echo 10/14 >> EF 25 to 30%, mod RV systolic dysfx CT head 10/14 >> mild atrophy, mild small vessel ischemic changes of white matter CT angio chest 10/14 >> coronary calcification, b/l consolidation and GGO, b/l anterior rib fx's 2/3/4/5/6/7/8, lateral rib fx's Rt 6/7/8 CT abd/pelvis 10/16 >> fatty liver, large Rt pelvic sidewall extraperitoneal hematoma 18.1 x 5.6 cm, Rt lower posterior flank hematoma 9.8 x 5.3 x 4.7 cm CT head 10/16 >> no acute process  Micro Data:  10/13 SARS COV2 >> POSITIVE 10/13 Blood >> Facklamia hominis, Staph auricularis 10/13 urine >> Aerococcus 10/14 Sputum >> oral flora  Antimicrobials/COVID Rx:   Decadron 10/13 >> Remdesivir 10/13 >> 10/17 Levaquin 10/13 >> 10/15 Vancomycin 10/14 >> 10/17 Rocephin 10/18 >>   Interim history/subjective:   Remains unresponsive Remains on high vent support Most of family wants to pursue comfort measures, some want   Objective   Blood pressure  101/60, pulse (!) 141, temperature 99.4 F (37.4 C), temperature source Axillary, resp. rate (!) 32, height 5\' 4"  (1.626 m), weight (!) 145.7 kg, SpO2 93 %.    Vent Mode: PCV FiO2 (%):  [70 %] 70 % Set Rate:  [28 bmp] 28 bmp PEEP:  [14 cmH20] 14 cmH20 Pressure Support:  [14 cmH20] 14 cmH20 Plateau Pressure:  [25 cmH20-28 cmH20] 28 cmH20   Intake/Output Summary (Last 24 hours) at 09/17/18 [id number] Last data filed at 09/17/18 0600 Gross per 24 hour  Intake 1356.44 ml  Output 1675 ml  Net -318.56 ml   Filed Weights   03/05/19 0500 03/06/19 0500 03/28/19 0500  Weight: (!) 145.1 kg (!) 145.9 kg (!) 145.7 kg    Examination:  General:  Morbidly n bed on vent HENT: NCAT ETT in place PULM: CTA B, vent supported breathing CV: RRR, no mgr GI: BS+, soft, nontender MSK: normal bulk and tone Neuro: sedated on vent   10/21 CXR port images reviewed: ETT at level of clavicle, bilateral airspace and interstitial disease, bilateral effusions  Resolved Hospital Problem list     Assessment & Plan:  ARDS from COVID 19 pneumonia: No major changes in last few days Complicated by flail chest Prognosis is poor, family moving towards comfort measures, awaiting full consensus from children Continue ful vent support for now Likely comfort measures within next 24 hours Don't escalate ventilator settings VAP prevention  Fever: due to ARDS? Concerning with rising WBC  Monitor cultures for now No changes to antibiotics  Acute systolic heart failure  Hold diuresis  Bacteremia : Facklamia hominis, Staph auricularis Continue ceftriaxone for now  Acute encephalopathy : worrisome for  anoxic brain injury Continue PAD protocol for now RASS target remains 0 to -1  Acute blood loss anemia: flank bleed Monitor for bleeding Transfuse PRBC for Hgb < 7 gm/dL  Prognosis: poor, family considering comfort measures.   Best practice:  Diet: tube feeding Pain/Anxiety/Delirium protocol (if  indicated): yes, RASS goal 0 to -1 VAP protocol (if indicated): yes DVT prophylaxis: sub cutaneous heparin GI prophylaxis: famotidine Glucose control: SSI Mobility: bed rest Code Status: DNR Family Communication: Quita Skye (son) waiting to hear from his brother before moving forward with comfort measures Disposition: remain in ICU  Labs   CBC: Recent Labs  Lab 03/03/19 0500  03/04/19 0523 03/05/19 0418 03/05/19 0500 03/06/19 0046 03/06/19 0500 03/09/2019 0545  WBC 12.3*  --  14.5*  --  17.3*  --  17.5* 16.8*  NEUTROABS 9.0*  --  9.9*  --  13.1*  --  11.8* 12.1*  HGB 8.4*   < > 8.2* 9.2* 8.9* 9.2* 8.6* 8.4*  HCT 26.3*   < > 25.7* 27.0* 28.3* 27.0* 28.2* 28.0*  MCV 99.6  --  101.2*  --  104.4*  --  106.4* 107.3*  PLT 196  --  192  --  183  --  175 164   < > = values in this interval not displayed.    Basic Metabolic Panel: Recent Labs  Lab 03/02/19 0421 03/03/19 0500 03/03/19 0848  03/04/19 0523 03/05/19 0418 03/05/19 0500 03/06/19 0046 03/06/19 0500 03/01/2019 0545  NA 139  --  141   < > 143 147* 148* 151* 150* 154*  K 4.0  --  3.6   < > 4.1 4.1 3.9 4.3 4.0 4.0  CL 104  --  109  --  111  --  114*  --  115* 120*  CO2 24  --  23  --  24  --  25  --  25 25  GLUCOSE 186*  --  245*  --  194*  --  195*  --  169* 178*  BUN 38*  --  44*  --  49*  --  54*  --  67* 80*  CREATININE 1.68*  --  1.47*  --  1.43*  --  1.50*  --  1.71* 1.98*  CALCIUM 8.0*  --  7.6*  --  8.1*  --  8.3*  --  8.3* 8.2*  MG 2.6* 2.5*  --   --  2.6*  --  2.7*  --  2.8*  --   PHOS 3.3 3.4  --   --  3.2  --  4.2  --  5.4*  --    < > = values in this interval not displayed.   GFR: Estimated Creatinine Clearance: 33.1 mL/min (A) (by C-G formula based on SCr of 1.98 mg/dL (H)). Recent Labs  Lab 03/04/19 0523 03/05/19 0500 03/06/19 0500 03/05/2019 0545  WBC 14.5* 17.3* 17.5* 16.8*    Liver Function Tests: Recent Labs  Lab 03/03/19 0848 03/04/19 0523 03/05/19 0500 03/06/19 0500 03/13/2019 0545  AST 79*  61* 54* 48* 50*  ALT 67* 63* 61* 56* 56*  ALKPHOS 41 45 53 51 51  BILITOT 0.8 0.8 0.8 0.7 0.7  PROT 4.9* 5.1* 5.4* 5.6* 5.3*  ALBUMIN 2.2* 2.3* 2.4* 2.4* 2.1*   No results for input(s): LIPASE, AMYLASE in the last 168 hours. No results for input(s): AMMONIA in the last 168 hours.  ABG    Component Value Date/Time   PHART 7.311 (L) 03/06/2019 2458  PCO2ART 51.0 (H) 03/06/2019 0046   PO2ART 68.0 (L) 03/06/2019 0046   HCO3 25.3 03/06/2019 0046   TCO2 27 03/06/2019 0046   ACIDBASEDEF 1.0 03/06/2019 0046   O2SAT 89.0 03/06/2019 0046     Coagulation Profile: No results for input(s): INR, PROTIME in the last 168 hours.  Cardiac Enzymes: No results for input(s): CKTOTAL, CKMB, CKMBINDEX, TROPONINI in the last 168 hours.  HbA1C: Hgb A1c MFr Bld  Date/Time Value Ref Range Status  02/27/2019 02:43 AM 6.0 (H) 4.8 - 5.6 % Final    Comment:    (NOTE) Pre diabetes:          5.7%-6.4% Diabetes:              >6.4% Glycemic control for   <7.0% adults with diabetes     CBG: Recent Labs  Lab 03/06/19 1545 03/06/19 1938 Mar 08, 2019 0046 2019/03/08 0352 03-08-19 0800  GLUCAP 220* 173* 189* 170* 163*     Critical care time: 40 minutes     Heber [place], MD [place] [facility] Pager: [phone] Cell: [phone] If no response, call [phone]

## 2019-03-17 NOTE — Progress Notes (Signed)
[facility] PCCM  Son Quita Skye confirms that the family wishes to withdraw care Comfort orderset written  Roselie Awkward, MD [place] [facility] Pager: [phone] Cell: [phone] If no response, call [phone]

## 2019-03-17 NOTE — Progress Notes (Signed)
Remaining morphine drip wasted with two RN witnesses per protocol, 25 ml wasted. Patient's son Quita Skye called by this RN with updated on comfort care and patient death at 23. Dr. Thereasa Solo notified of time of death.

## 2019-03-17 NOTE — Progress Notes (Signed)
Pt noted to be in and out of a-fib on tele monitor.  Pt then went into a sustained a-fib with HR between 115 - 140.  Elink was called and MD was informed of this change.  See MAR.  Will continue to monitor pt.

## 2019-03-17 NOTE — Progress Notes (Signed)
Notified CDS of pt's passing per request of bedside RN. CDS referral # [id number]. Also documented in flowsheet.

## 2019-03-17 NOTE — Progress Notes (Signed)
[name] [name]  UMP:[id number] DOB: 20-May-1940 DOA: 03/09/2019 PCP: Charletta Cousin., MD    Brief Narrative:  78 year old with a history of ascending aortic dilatation, chronic systolic and diastolic CHF, HTN, paroxysmal atrial fibrillation, moderate MVR, polymyalgia rheumatica, and chronic hypoxic respiratory failure on 2 L nasal cannula oxygen at all times who was diagnosed with Covid 10/12.  She presented to the ED with increasing shortness of breath and hypoxia requiring 12 L of oxygen support.  While in the ED she suffered a PEA arrest.  She recovered following 3 doses of epinephrine and ACLS care.  Significant Events: 10/12 diagnosed with Covid 10/13 intubated - admit to [facility] 10/14 TTE -EF 25-30% 10/16 found to have right flank hematoma 10/17 transfused 2 units PRBC  COVID-19 specific Treatment: Decadron 10/13 > Remdesivir 10/13 > 10/17  Subjective: Noncommunicative at time of exam today.  No evidence of discomfort.  Respirations on ventilator did not appear to be labored.  Anticipate transition to comfort focused care today.  Ongoing discussions between PCCM and family.  Assessment & Plan:  Covid pneumonia - ARDS - acute on chronic hypoxic respiratory failure Vent management per PCCM -unable to prone given multiple rib fractures -has completed a course of remdesivir and Decadron  Recent Labs  Lab 03/03/19 0500 03/03/19 0848 03/04/19 0503 03/04/19 0523 03/05/19 0500 03/06/19 0500 03-26-19 0545  DDIMER 2.71*  --   --  13.07* >20.00* >20.00* 10.83*  CRP 2.7*  --  1.6*  --  1.9* 5.5* 7.9*  ALT  --  67*  --  63* 61* 56* 56*    Multiple bilateral rib fractures Presumed to be secondary to CPR  Status post asystolic/PEA arrest Likely hypoxic in etiology  Markedly elevated D-dimer D-dimer had been greater than 20 since 10/20 -unable to empirically fully anticoagulate due to significant flank hematoma -CT angio chest 10/14 noted no evidence of pulmonary  embolism  Chronic combined systolic and diastolic CHF Prior baseline EF 55% February 2020 -EF post-arrest 25-30% -case has been discussed with cardiology  Paroxysmal atrial fibrillation Chronically on Eliquis -presently covered with IV heparin  Facklamia hominis bacteremia Ceftriaxone x7 days per ID  Encephalopathy Likely a combination of anoxic injury from arrest as well as Covid infection -no seizure activity on EEG -CT head without acute findings  Extraperitoneal hematoma/right flank hematoma -acute blood loss anemia Transfused 2 units 10/17 -holding anticoagulation -hemoglobin stable  Chronic polymyalgia rheumatica Chronically on prednisone  Hypernatremia Dosing with free water via enteral route  DVT prophylaxis: Subcutaneous heparin Code Status: Limited code - no CPR if arrests again Family Communication: Per PCCM Disposition Plan: ICU -anticipate transition to comfort focused care only today  Consultants:  none  Antimicrobials:  Rocephin 10/18 >  Objective: Blood pressure 125/83, pulse 91, temperature 99.4 F (37.4 C), temperature source Axillary, resp. rate 19, height 5\' 4"  (1.626 m), weight (!) 145.7 kg, SpO2 93 %.  Intake/Output Summary (Last 24 hours) at 03-26-19 0814 Last data filed at Mar 26, 2019 0600 Gross per 24 hour  Intake 1356.44 ml  Output 1675 ml  Net -318.56 ml   Filed Weights   03/05/19 0500 03/06/19 0500 Mar 26, 2019 0500  Weight: (!) 145.1 kg (!) 145.9 kg (!) 145.7 kg    Examination: General: No acute respiratory distress -sedate Lungs: Fine crackles throughout without change Cardiovascular: No appreciable murmur -tachycardic and irregular Abdomen: Nondistended, soft, no rebound Extremities: 1+ bilateral lower extremity edema without change  CBC: Recent Labs  Lab 03/05/19 0500 03/06/19 0046  03/06/19 0500 02/24/2019 0545  WBC 17.3*  --  17.5* 16.8*  NEUTROABS 13.1*  --  11.8* 12.1*  HGB 8.9* 9.2* 8.6* 8.4*  HCT 28.3* 27.0* 28.2*  28.0*  MCV 104.4*  --  106.4* 107.3*  PLT 183  --  175 164   Basic Metabolic Panel: Recent Labs  Lab 03/04/19 0523  03/05/19 0500 03/06/19 0046 03/06/19 0500 02/28/2019 0545  NA 143   < > 148* 151* 150* 154*  K 4.1   < > 3.9 4.3 4.0 4.0  CL 111  --  114*  --  115* 120*  CO2 24  --  25  --  25 25  GLUCOSE 194*  --  195*  --  169* 178*  BUN 49*  --  54*  --  67* 80*  CREATININE 1.43*  --  1.50*  --  1.71* 1.98*  CALCIUM 8.1*  --  8.3*  --  8.3* 8.2*  MG 2.6*  --  2.7*  --  2.8*  --   PHOS 3.2  --  4.2  --  5.4*  --    < > = values in this interval not displayed.   GFR: Estimated Creatinine Clearance: 33.1 mL/min (A) (by C-G formula based on SCr of 1.98 mg/dL (H)).  Liver Function Tests: Recent Labs  Lab 03/04/19 0523 03/05/19 0500 03/06/19 0500 02/18/2019 0545  AST 61* 54* 48* 50*  ALT 63* 61* 56* 56*  ALKPHOS 45 53 51 51  BILITOT 0.8 0.8 0.7 0.7  PROT 5.1* 5.4* 5.6* 5.3*  ALBUMIN 2.3* 2.4* 2.4* 2.1*    HbA1C: Hgb A1c MFr Bld  Date/Time Value Ref Range Status  02/27/2019 02:43 AM 6.0 (H) 4.8 - 5.6 % Final    Comment:    (NOTE) Pre diabetes:          5.7%-6.4% Diabetes:              >6.4% Glycemic control for   <7.0% adults with diabetes     CBG: Recent Labs  Lab 03/06/19 1545 03/06/19 1938 02/19/2019 0046 02/20/2019 0352 02/16/2019 0800  GLUCAP 220* 173* 189* 170* 163*    Recent Results (from the past 240 hour(s))  SARS CORONAVIRUS 2 (TAT 6-24 HRS) Nasopharyngeal Nasopharyngeal Swab     Status: Abnormal   Collection Time: Mar 10, 2019  5:42 PM   Specimen: Nasopharyngeal Swab  Result Value Ref Range Status   SARS Coronavirus 2 POSITIVE (A) NEGATIVE Final    Comment: RESULT CALLED TO, READ BACK BY AND VERIFIED WITH: Zola Button, RN AT [phone] ON 02/27/2019 BY SAINVILUS S (NOTE) SARS-CoV-2 target nucleic acids are DETECTED. The SARS-CoV-2 RNA is generally detectable in upper and lower respiratory specimens during the acute phase of infection. Positive results are  indicative of active infection with SARS-CoV-2. Clinical  correlation with patient history and other diagnostic information is necessary to determine patient infection status. Positive results do  not rule out bacterial infection or co-infection with other viruses. The expected result is Negative. Fact Sheet for Patients: HairSlick.no Fact Sheet for Healthcare Providers: quierodirigir.com This test is not yet approved or cleared by the [place] FDA and  has been authorized for detection and/or diagnosis of SARS-CoV-2 by FDA under an Emergency Use Authorization (EUA). This EUA will remain  in effect (meaning this test can b e used) for the duration of the COVID-19 declaration under Section 564(b)(1) of the Act, 21 U.S.C. section 360bbb-3(b)(1), unless the authorization is terminated or revoked sooner. Performed at  [facility], [address] [place], [place] [address]   Urine Culture     Status: Abnormal   Collection Time: 03/16/2019  5:52 PM   Specimen: Urine, Clean Catch  Result Value Ref Range Status   Specimen Description   Final    URINE, CLEAN CATCH Performed at [facility], [address] [place], [place] [address]    Special Requests   Final    NONE Performed at [facility], [address] [place], [place] [address]    Culture >=100,000 COLONIES/mL AEROCOCCUS URINAE (A)  Final   Report Status 03/03/2019 FINAL  Final  Blood culture (routine x 2)     Status: None   Collection Time: 03/08/2019  9:48 PM   Specimen: BLOOD  Result Value Ref Range Status   Specimen Description   Final    BLOOD BLOOD LEFT HAND Performed at [facility], [address] [place], [place] [address]    Special Requests   Final    BOTTLES DRAWN AEROBIC AND ANAEROBIC Blood Culture adequate volume Performed at [facility], [address] [place],  [place] [address]    Culture   Final    NO GROWTH 5 DAYS Performed at [facility], [address] [place], [place] [address]    Report Status 03/03/2019 FINAL  Final  Blood culture (routine x 2)     Status: Abnormal   Collection Time: 02/25/2019  9:48 PM   Specimen: BLOOD  Result Value Ref Range Status   Specimen Description   Final    BLOOD BLOOD LEFT WRIST Performed at [facility], [address] [place], [place] [address]    Special Requests   Final    BOTTLES DRAWN AEROBIC AND ANAEROBIC Blood Culture adequate volume Performed at [facility], [address] [place], [place] [address]    Culture  Setup Time   Final    AEROBIC BOTTLE ONLY GRAM POSITIVE COCCI CRITICAL RESULT CALLED TO, READ BACK BY AND VERIFIED WITH: S CHRISTY PHARMD 02/28/19 JDW 1723    Culture (A)  Final    FACKLAMIA HOMINIS Standardized susceptibility testing for this organism is not available. STAPHYLOCOCCUS AURICULARIS THE SIGNIFICANCE OF ISOLATING THIS ORGANISM FROM A SINGLE SET OF BLOOD CULTURES WHEN MULTIPLE SETS ARE DRAWN IS UNCERTAIN. PLEASE NOTIFY THE MICROBIOLOGY DEPARTMENT WITHIN ONE WEEK IF SPECIATION AND SENSITIVITIES ARE REQUIRED. Performed at [facility], [address] [place], [place] [address]    Report Status 03/04/2019 FINAL  Final  Culture, respiratory (non-expectorated)     Status: None   Collection Time: 02/27/19 11:30 AM   Specimen: Tracheal Aspirate; Respiratory  Result Value Ref Range Status   Specimen Description   Final    TRACHEAL ASPIRATE Performed at [facility], [address] [place], [place] [address]    Special Requests   Final    NONE Performed at [facility], [address] [place], [place] [address]    Gram Stain   Final    RARE WBC PRESENT,BOTH PMN AND MONONUCLEAR RARE GRAM POSITIVE COCCI IN PAIRS    Culture   Final    FEW Consistent with normal respiratory flora.  Performed at [facility], [address] [place], [place] [address]    Report Status 03/02/2019 FINAL  Final  MRSA PCR Screening     Status: None   Collection Time: 02/27/19  5:20  PM   Specimen: Nasal Mucosa; Nasopharyngeal  Result Value Ref Range Status   MRSA by PCR NEGATIVE NEGATIVE Final    Comment:        The GeneXpert MRSA Assay (FDA approved for NASAL specimens only), is one component of a comprehensive MRSA colonization surveillance program. It is not intended to diagnose MRSA infection nor to guide or monitor treatment for MRSA infections. Performed at [facility], [address] [place], [place] [address]   Culture, respiratory (non-expectorated)     Status: None (Preliminary result)   Collection Time: 03/06/19  6:06 PM   Specimen: Tracheal Aspirate; Respiratory  Result Value Ref Range Status   Specimen Description   Final    TRACHEAL ASPIRATE Performed at [facility], [address], [place], [place] [address]    Special Requests   Final    NONE Performed at [facility], [address], [place], [place] [address]    Gram Stain   Final    MODERATE WBC PRESENT,BOTH PMN AND MONONUCLEAR NO ORGANISMS SEEN Performed at [facility], [address] [place], [place] [address]    Culture PENDING  Incomplete   Report Status PENDING  Incomplete     Scheduled Meds: . atorvastatin  40 mg Per Tube q1800  . carvedilol  12.5 mg Per Tube BID WC  . chlorhexidine  15 mL Mouth/Throat BID  . Chlorhexidine Gluconate Cloth  6 each Topical Daily  . dexamethasone (DECADRON) injection  6 mg Intravenous Q24H  . famotidine  20 mg Per Tube Daily  . feeding supplement (PRO-STAT SUGAR FREE 64)  30 mL Per Tube QID  . feeding supplement (VITAL AF 1.2 CAL)  1,000 mL Per Tube Q24H  . folic acid  1 mg Per Tube Daily  . gabapentin  300 mg Per Tube QHS  . heparin injection (subcutaneous)  10,000 Units Subcutaneous  Q8H  . insulin aspart  0-20 Units Subcutaneous Q4H  . insulin aspart  2 Units Subcutaneous Q4H  . lidocaine  1 patch Transdermal Q24H  . mouth rinse  15 mL Mouth Rinse 10 times per day  . mupirocin ointment   Nasal BID  . oxyCODONE  10 mg Per Tube Q6H  . sodium chloride flush  10-40 mL Intracatheter Q12H   Continuous Infusions: . sodium chloride 10 mL/hr at 03/12/2019 0600  . cefTRIAXone (ROCEPHIN)  IV Stopped (03/06/19 1446)  . fentaNYL infusion INTRAVENOUS 50 mcg/hr (03/12/2019 0600)     LOS: 9 days   Lonia Blood, MD [facility] Office  [phone] Pager - Text Page per Amion  If 7PM-7AM, please contact night-coverage per Amion 2019/03/12, 8:14 AM

## 2019-03-17 NOTE — Progress Notes (Signed)
Orders received for extubation per comfort care order set.  Extubated patient to room air.

## 2019-03-17 DEATH — deceased

## 2019-04-16 NOTE — Discharge Summary (Addendum)
   Death Summary   Courteney Alderete HLK:[id number] DOB: 1940/12/18 DOA: 2019/03/18  PCP: Charletta Cousin., MD  Admit date: Mar 18, 2019 Date of Death: Mar 27, 2019  Final Diagnoses:  Active Problems:   PAF (paroxysmal atrial fibrillation) (HCC)   HTN (hypertension)   DCM (dilated cardiomyopathy) (HCC)   IFG (impaired fasting glucose)   Morbid obesity (HCC)   Mitral regurgitation   Polymyalgia rheumatica (HCC)   Ascending aorta dilatation (HCC)   Respiratory arrest (HCC)   Acute on chronic respiratory failure with hypoxia (HCC)   Pneumonia due to COVID-19 virus   Acute on chronic combined systolic and diastolic CHF (congestive heart failure) (HCC)   Acute respiratory distress syndrome (ARDS) due to COVID-19 virus (HCC)   Aspiration pneumonia of both lower lobes due to gastric secretions (HCC)   Flail chest   Cardiac arrest ([place])   Acute encephalopathy   Abdominal distention   Bleeding behind the abdominal cavity   Bacteremia   History of present illness:  78 year old with a history of ascending aortic dilatation, chronic systolic and diastolic CHF, HTN, paroxysmal atrial fibrillation, moderate MVR, polymyalgia rheumatica, and chronic hypoxic respiratory failure on 2 L nasal cannula oxygen at all times who was diagnosed with Covid 10/12.  She presented to the ED with increasing shortness of breath and hypoxia requiring 12 L of oxygen support.  While in the ED she suffered a PEA arrest.  She recovered following 3 doses of epinephrine and ACLS care.  Hospital Course:  The patient was a 78 year old with a complex medical history who was admitted through the ED as discussed above, and subsequently suffered a cardiac arrest while in the emergency room.  She required emergent intubation and ultimately transferred to [facility] ICU for ongoing ventilator support.  Despite aggressive ongoing medical care the patient failed to consistently improved during her stay in the [facility] ICU.   She continued to require high level ventilator support due to her Covid induced ARDS.  Ultimately it became clear that the patient had reached a point at which ongoing aggressive medical care was not likely to significantly impact the length or quality of her life.  It was not felt appropriate to continue with ventilator support.  This was discussed with the family by PCCM and the decision was made to transition to comfort focused care.  She was extubated for comfort care and died peacefully 2023-03-27.  Cause of death is Covid pneumonia with ARDS complicated by hypoxia induced cardiac arrest.   Signed:  Cherene Altes  [facility] 04/03/2019, 7:07 PM

## 2019-07-08 ENCOUNTER — Ambulatory Visit: Payer: HMO

## 2020-01-02 IMAGING — CT CT HEAD W/O CM
3 series · 15 of 47 positions shown, 18 images · non-contrast
Comparison: None.

CLINICAL DATA: Weakness cardiac arrest

EXAM:
CT HEAD WITHOUT CONTRAST
TECHNIQUE: Contiguous axial images were obtained from the base of the skull
through the vertex without intravenous contrast.

[Series 2: head wo · axial · 0.47mm/px · z∈[+1680,+1805]mm · 9 of 31 slices shown, 12 images]
[im 3/31  brain]
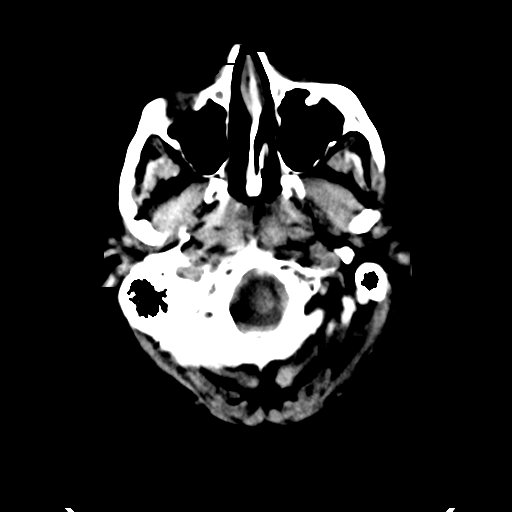
[im 3/31  bone]
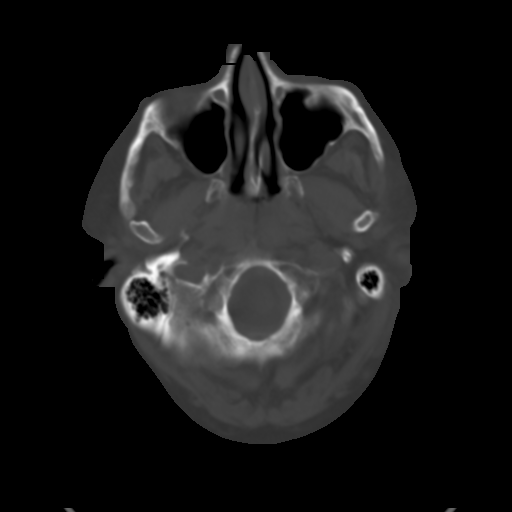
[im 6/31  brain]
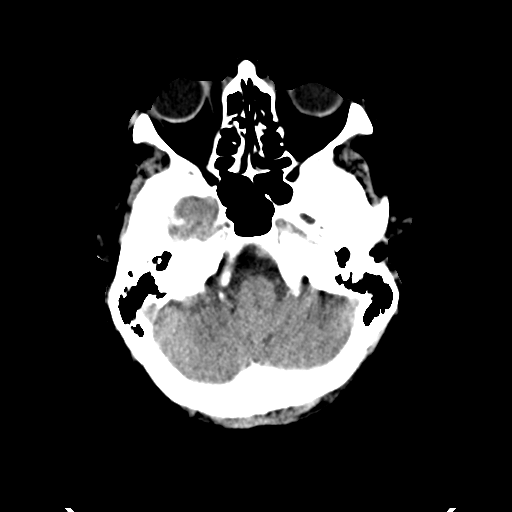
[im 9/31  brain]
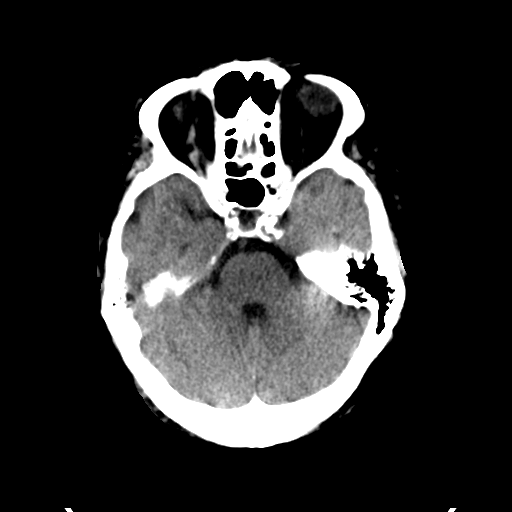
[im 12/31  brain]
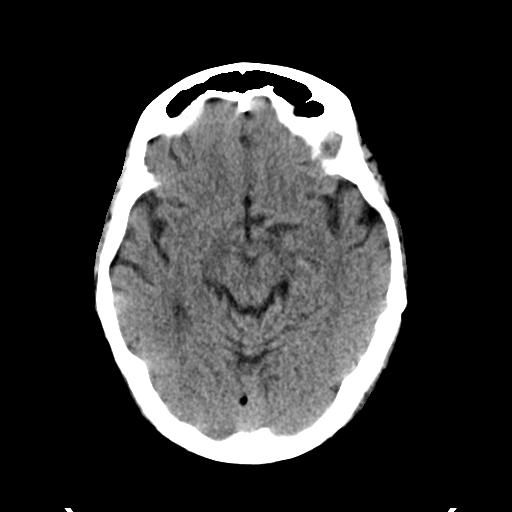
[im 16/31  brain]
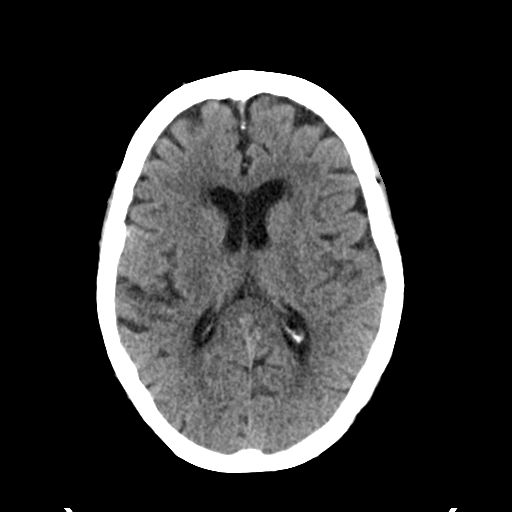
[im 16/31  bone]
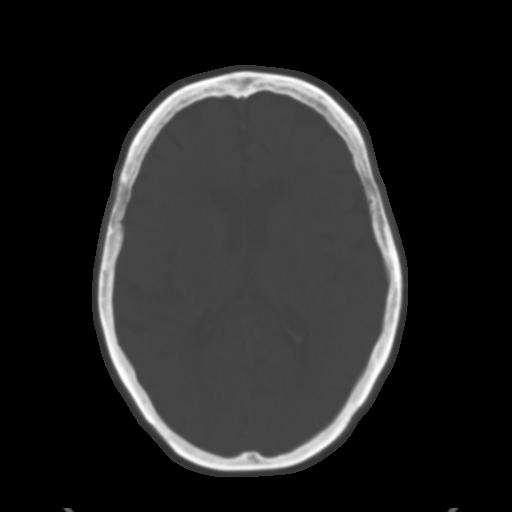
[im 19/31  brain]
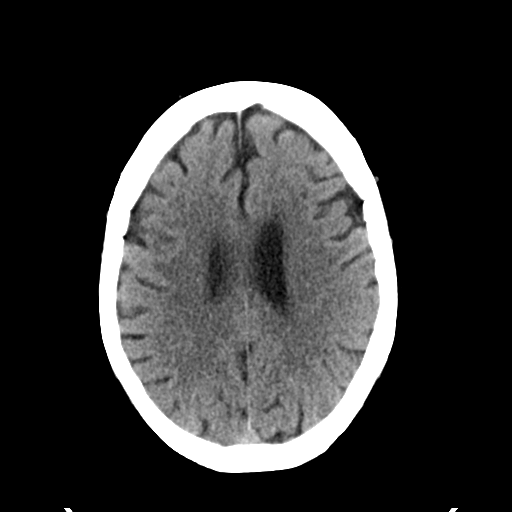
[im 22/31  brain]
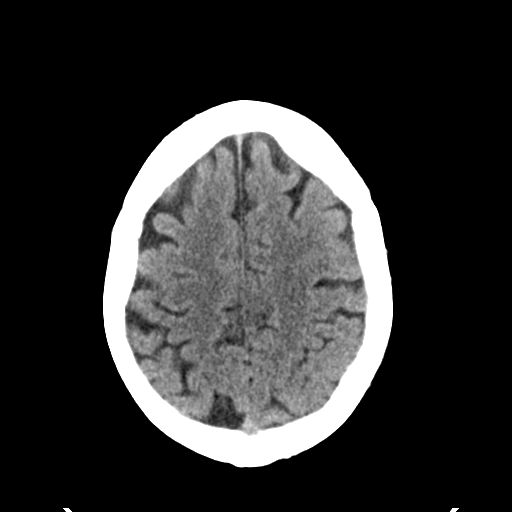
[im 25/31  brain]
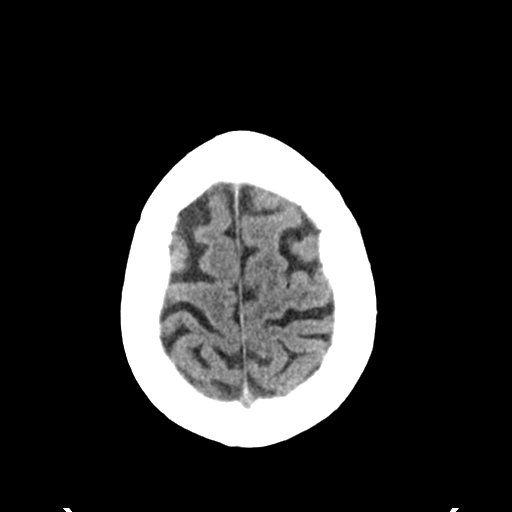
[im 28/31  brain]
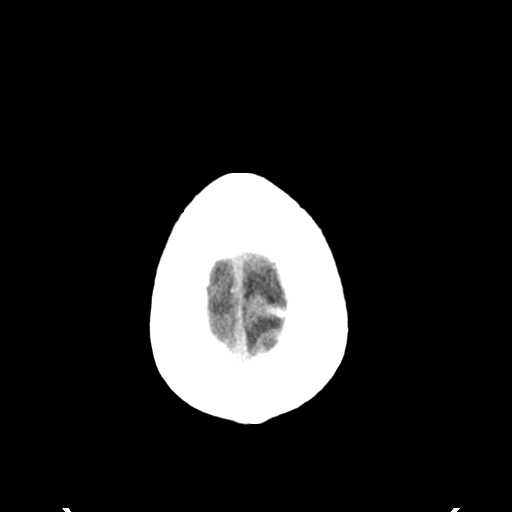
[im 28/31  bone]
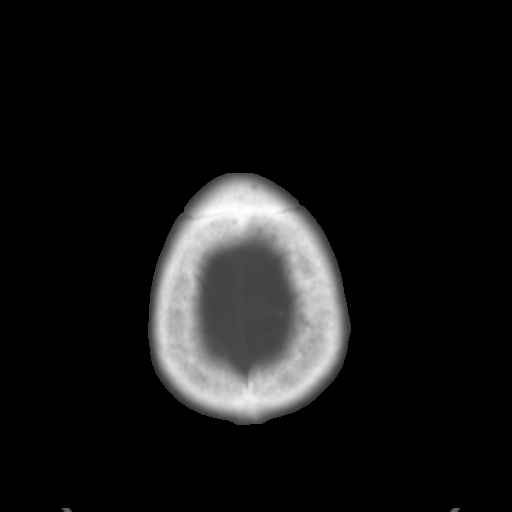

[Series 4: coronal soft tissue · coronal · 0.29mm/px · 3 of 66 slices shown]
[im 22/66  brain]
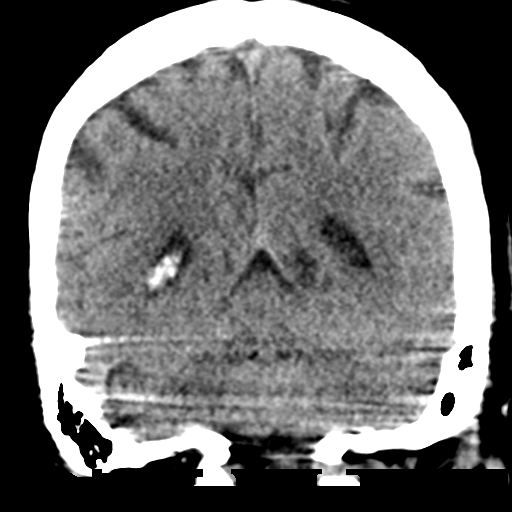
[im 29/66  brain]
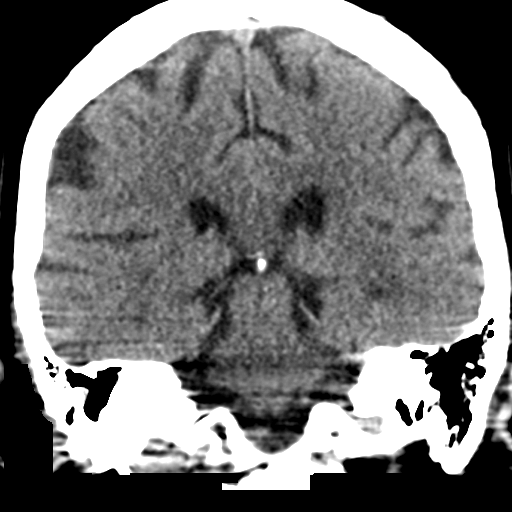
[im 37/66  brain]
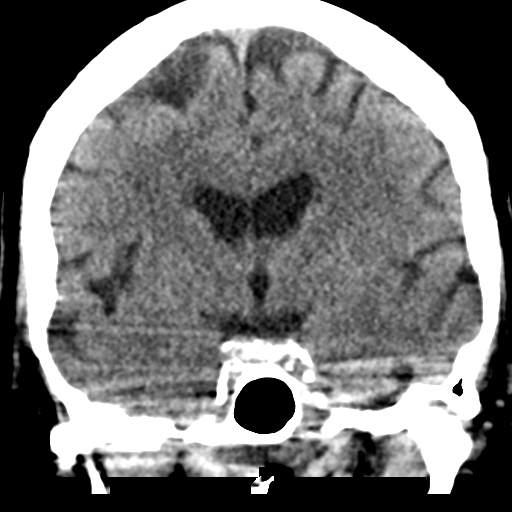

[Series 5: sagittal soft tissue · sagittal · 0.31mm/px · 3 of 49 slices shown]
[im 17/49  brain]
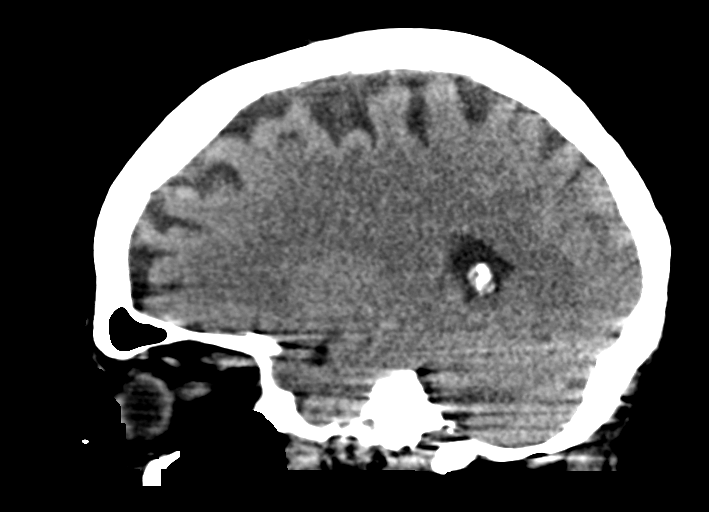
[im 25/49  brain]
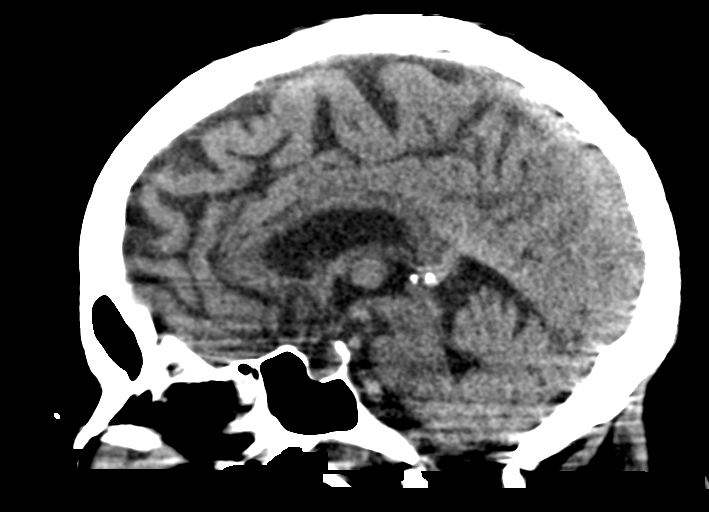
[im 33/49  brain]
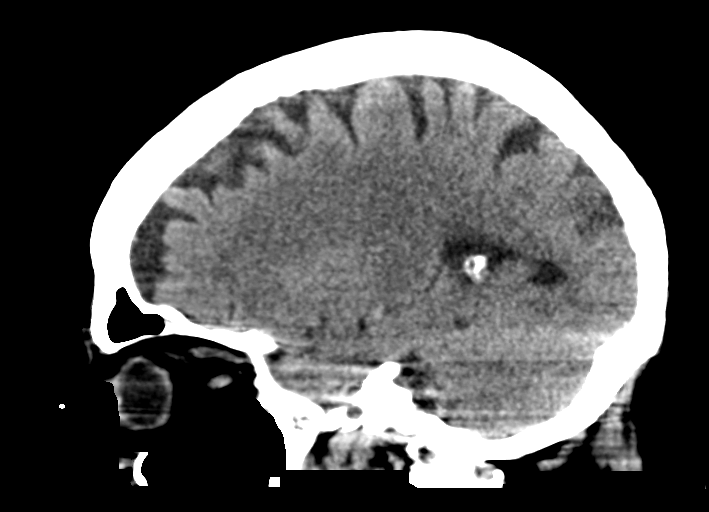

[15 of 47 positions shown; findings below may reference images not displayed]

FINDINGS: Brain: No evidence of acute infarction, hemorrhage, hydrocephalus,
extra-axial collection or mass lesion/mass effect. Mild atrophy.
Mild small vessel ischemic changes of the white matter.

Vascular: No hyperdense vessels.  Carotid vascular calcification

Skull: Normal. Negative for fracture or focal lesion.

Sinuses/Orbits: Mild mucosal thickening in the ethmoid sinuses

Other: None
IMPRESSION: 1. No CT evidence for acute intracranial abnormality.
2. Atrophy and mild small vessel ischemic changes of the white
matter

## 2020-01-02 IMAGING — DX DG CHEST 1V PORT
1 series · 1 of 1 positions shown · non-contrast
Comparison: February 26, 2019 study obtained earlier in the day

CLINICAL DATA: Status post cardiac arrest.  Hypoxia.

EXAM:
PORTABLE CHEST 1 VIEW

[chest ap]
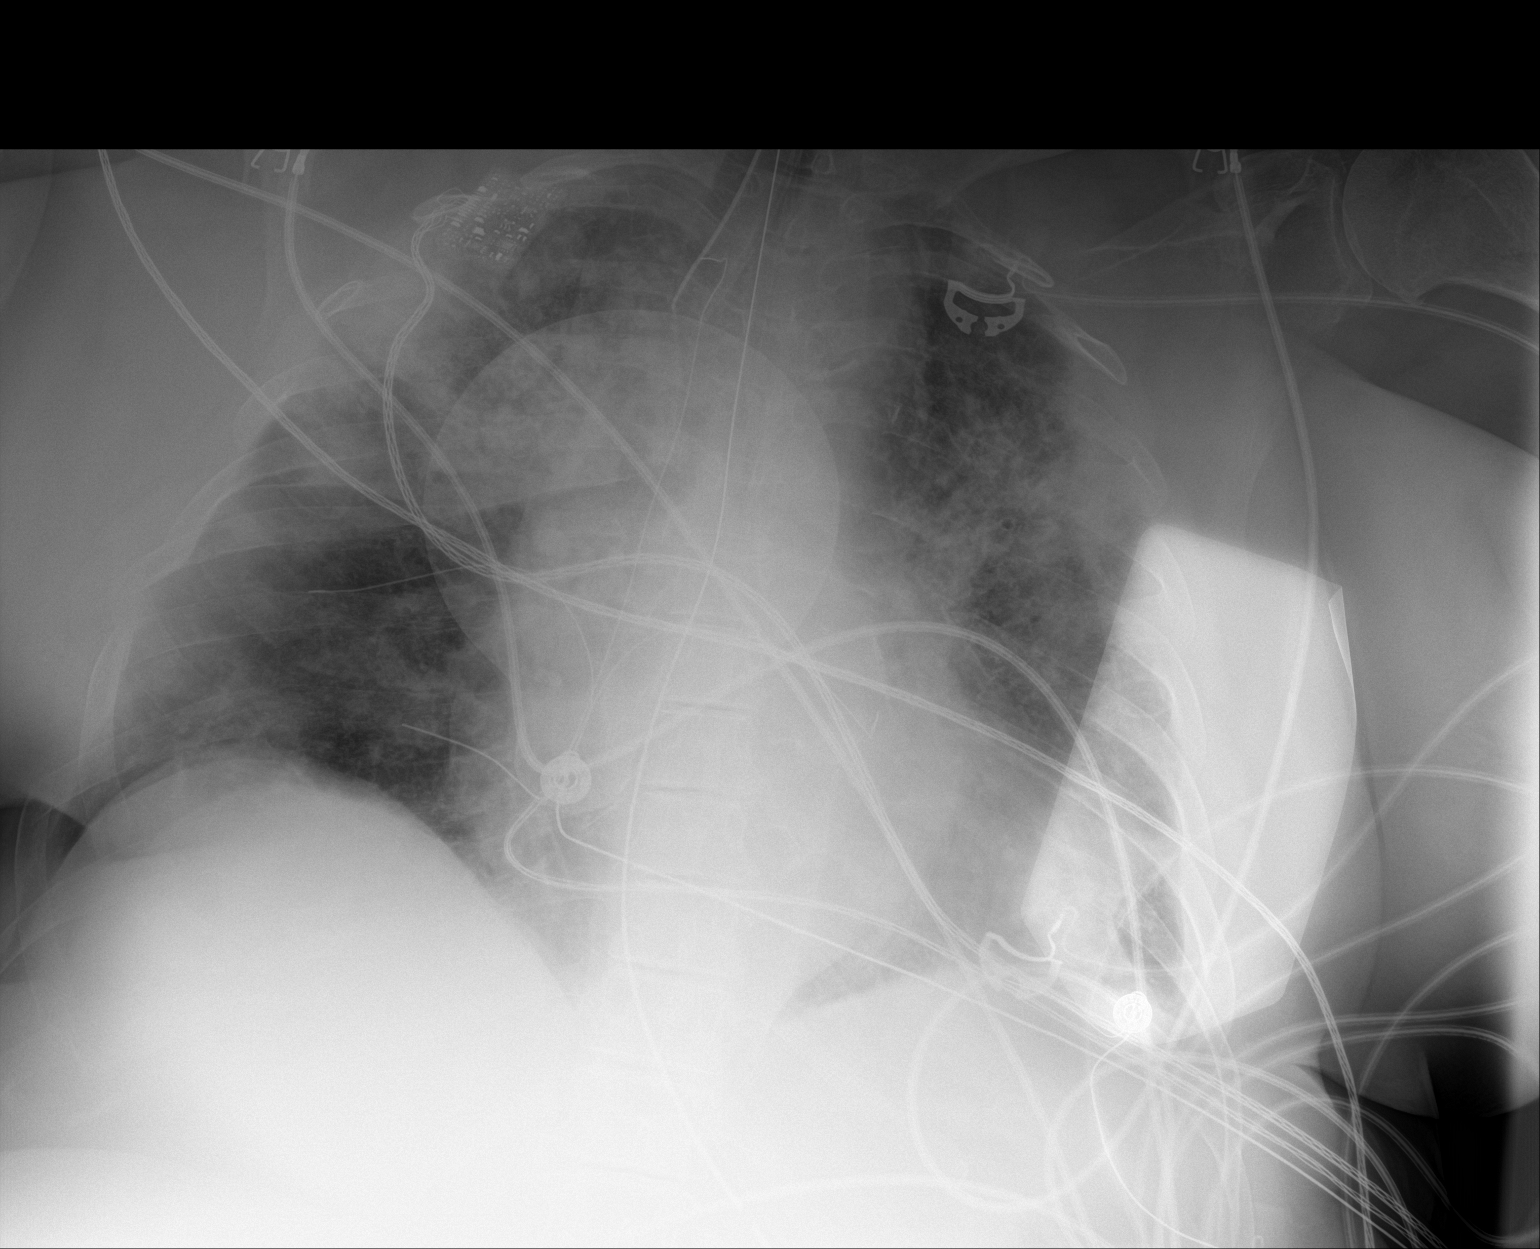

[1 of 1 positions shown; findings below may reference images not displayed]

FINDINGS: Endotracheal tube tip is 3.8 cm above the carina. Nasogastric tube
tip and side port are below the diaphragm. No pneumothorax. There is
airspace opacity in both upper lobes, more notable on the left than
on the right. There is also patchy opacity in the right base. These
are new findings compared to earlier in the day. Heart is upper
normal in size with pulmonary vascularity normal. No adenopathy. No
bone lesions. There is aortic atherosclerosis.
IMPRESSION: Tube positions as described without pneumothorax. Multifocal
airspace opacity, most notably in the left upper lobe. Question
pneumonia versus aspiration. Both entities may be present
concurrently.

Stable cardiac silhouette. Aortic Atherosclerosis ([id number]).

## 2020-01-10 IMAGING — DX DG CHEST 1V PORT
1 series · 1 of 1 positions shown · non-contrast
Comparison: 03/05/2019

CLINICAL DATA: Respiratory failure

EXAM:
PORTABLE CHEST 1 VIEW

[chest ap]
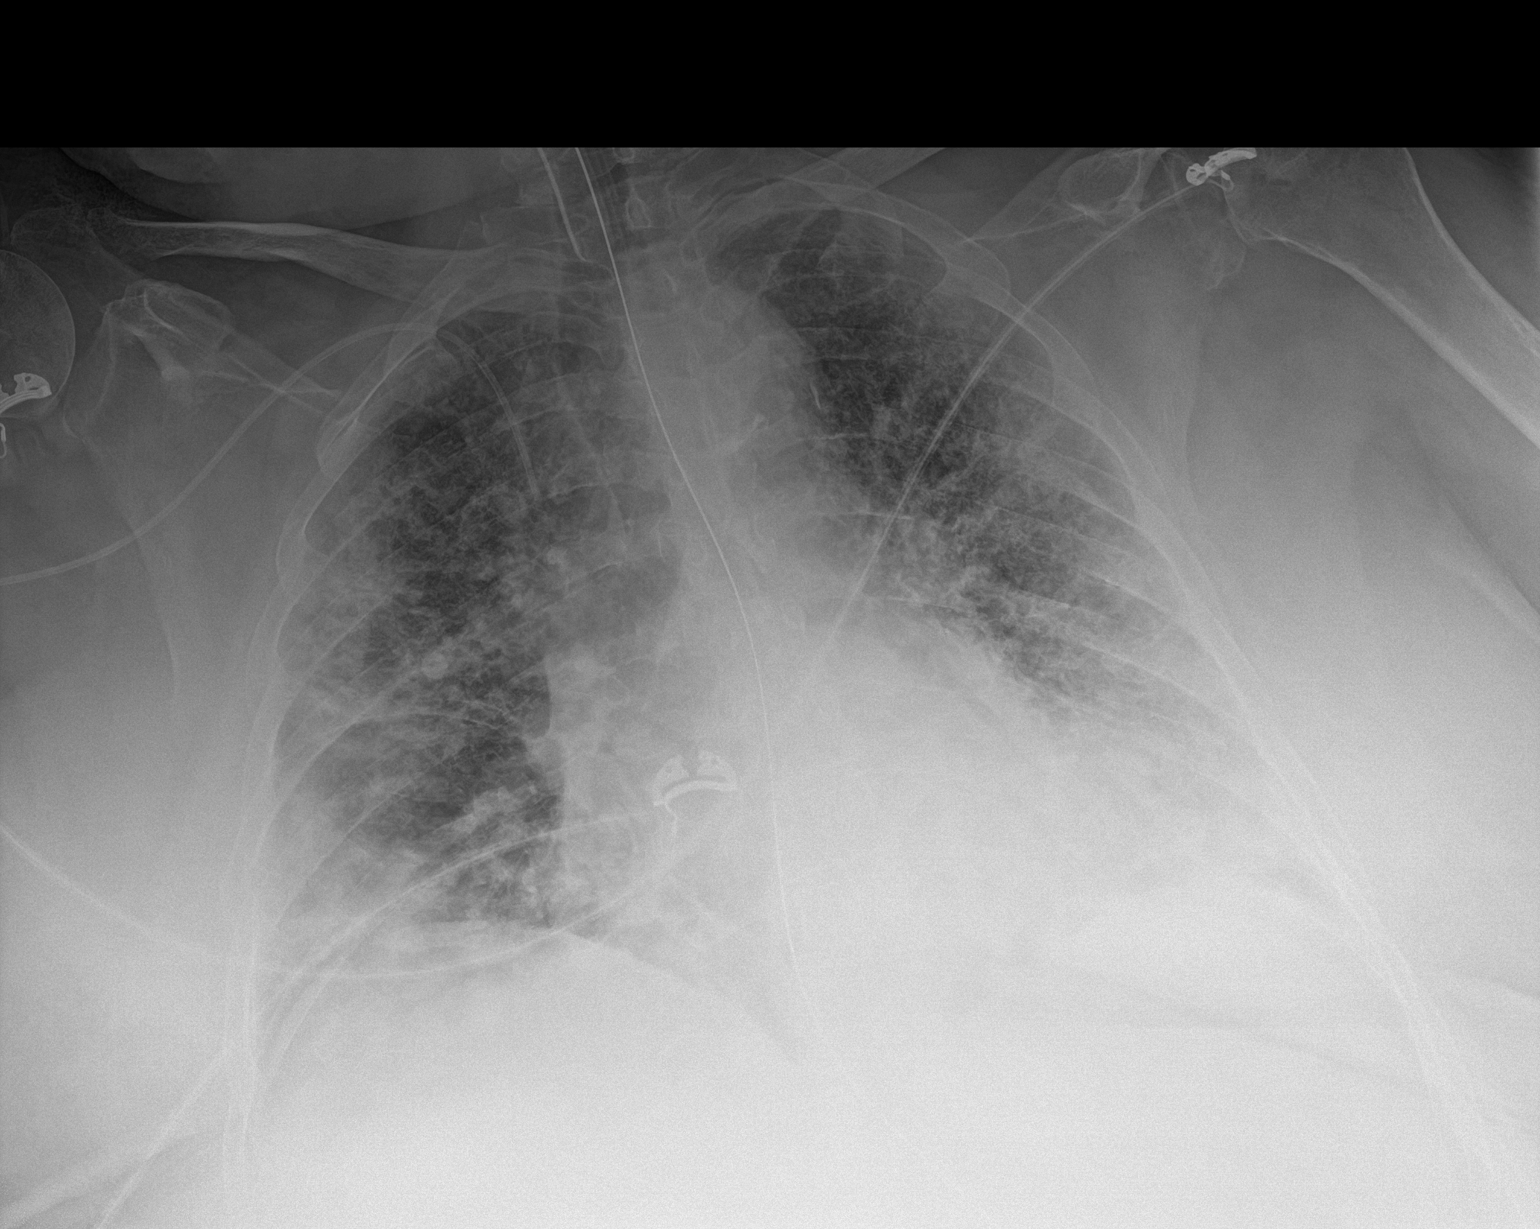

[1 of 1 positions shown; findings below may reference images not displayed]

FINDINGS: Support devices are unchanged. Bilateral airspace opacities are
again noted, stable. Cardiomegaly. No effusions. No acute bony
abnormality.
IMPRESSION: Bilateral pulmonary opacities again noted, unchanged.

Mild cardiomegaly.
